# Patient Record
Sex: Male | Born: 1945 | Race: Black or African American | Hispanic: No | Marital: Single | Smoking: Current every day smoker
Health system: Southern US, Community
[De-identification: ages and names within clinical notes are randomized; demographics above are authoritative.]

## PROBLEM LIST (undated history)

## (undated) DIAGNOSIS — E785 Hyperlipidemia, unspecified: Secondary | ICD-10-CM

## (undated) DIAGNOSIS — N400 Enlarged prostate without lower urinary tract symptoms: Secondary | ICD-10-CM

## (undated) DIAGNOSIS — F17200 Nicotine dependence, unspecified, uncomplicated: Secondary | ICD-10-CM

## (undated) DIAGNOSIS — I5031 Acute diastolic (congestive) heart failure: Secondary | ICD-10-CM

## (undated) DIAGNOSIS — I10 Essential (primary) hypertension: Secondary | ICD-10-CM

## (undated) DIAGNOSIS — I452 Bifascicular block: Secondary | ICD-10-CM

## (undated) DIAGNOSIS — E669 Obesity, unspecified: Secondary | ICD-10-CM

## (undated) HISTORY — DX: Bifascicular block: I45.2

## (undated) HISTORY — DX: Acute diastolic (congestive) heart failure: I50.31

## (undated) HISTORY — DX: Essential (primary) hypertension: I10

## (undated) HISTORY — DX: Obesity, unspecified: E66.9

## (undated) HISTORY — DX: Hyperlipidemia, unspecified: E78.5

## (undated) HISTORY — DX: Benign prostatic hyperplasia without lower urinary tract symptoms: N40.0

## (undated) HISTORY — DX: Nicotine dependence, unspecified, uncomplicated: F17.200

---

## 1998-04-29 ENCOUNTER — Other Ambulatory Visit: Admission: RE | Admit: 1998-04-29 | Discharge: 1998-04-29 | Payer: Self-pay | Admitting: Family Medicine

## 1998-06-02 ENCOUNTER — Other Ambulatory Visit: Admission: RE | Admit: 1998-06-02 | Discharge: 1998-06-02 | Payer: Self-pay | Admitting: Family Medicine

## 1998-07-01 ENCOUNTER — Other Ambulatory Visit: Admission: RE | Admit: 1998-07-01 | Discharge: 1998-07-01 | Payer: Self-pay | Admitting: Family Medicine

## 2004-10-27 ENCOUNTER — Ambulatory Visit: Payer: Self-pay | Admitting: Family Medicine

## 2005-02-13 ENCOUNTER — Ambulatory Visit: Payer: Self-pay | Admitting: Family Medicine

## 2005-03-29 ENCOUNTER — Ambulatory Visit: Payer: Self-pay | Admitting: Family Medicine

## 2005-04-20 ENCOUNTER — Ambulatory Visit: Payer: Self-pay | Admitting: Family Medicine

## 2005-06-27 ENCOUNTER — Ambulatory Visit: Payer: Self-pay | Admitting: Family Medicine

## 2005-08-30 ENCOUNTER — Ambulatory Visit: Payer: Self-pay | Admitting: Family Medicine

## 2005-11-01 ENCOUNTER — Ambulatory Visit: Payer: Self-pay | Admitting: Family Medicine

## 2005-12-27 ENCOUNTER — Ambulatory Visit: Payer: Self-pay | Admitting: Family Medicine

## 2006-04-06 ENCOUNTER — Ambulatory Visit: Payer: Self-pay | Admitting: Family Medicine

## 2006-06-29 ENCOUNTER — Ambulatory Visit: Payer: Self-pay | Admitting: Family Medicine

## 2006-11-06 ENCOUNTER — Ambulatory Visit: Payer: Self-pay | Admitting: Family Medicine

## 2007-01-02 ENCOUNTER — Ambulatory Visit: Payer: Self-pay | Admitting: Family Medicine

## 2007-02-05 ENCOUNTER — Ambulatory Visit: Payer: Self-pay | Admitting: Family Medicine

## 2007-04-11 ENCOUNTER — Ambulatory Visit: Payer: Self-pay | Admitting: Family Medicine

## 2007-05-30 ENCOUNTER — Ambulatory Visit: Payer: Self-pay | Admitting: Internal Medicine

## 2007-06-18 ENCOUNTER — Ambulatory Visit: Payer: Self-pay | Admitting: Internal Medicine

## 2007-07-17 ENCOUNTER — Ambulatory Visit: Payer: Self-pay | Admitting: Family Medicine

## 2007-07-17 LAB — CONVERTED CEMR LAB
BUN: 11 mg/dL (ref 6–23)
Chloride: 101 meq/L (ref 96–112)
Creatinine, Ser: 0.79 mg/dL (ref 0.40–1.50)
Glucose, Bld: 82 mg/dL (ref 70–99)
Potassium: 4.6 meq/L (ref 3.5–5.3)

## 2007-09-09 ENCOUNTER — Ambulatory Visit: Payer: Self-pay | Admitting: Family Medicine

## 2007-11-12 ENCOUNTER — Ambulatory Visit: Payer: Self-pay | Admitting: Family Medicine

## 2007-11-12 LAB — CONVERTED CEMR LAB
ALT: 15 U/L
AST: 14 U/L
Albumin: 4.3 g/dL
Alkaline Phosphatase: 83 U/L
BUN: 19 mg/dL
Basophils Absolute: 0 10*3/uL
Basophils Relative: 0 %
CO2: 28 meq/L
Calcium: 9.6 mg/dL
Chloride: 99 meq/L
Cholesterol: 180 mg/dL
Creatinine, Ser: 0.89 mg/dL
Eosinophils Absolute: 0.1 10*3/uL — ABNORMAL LOW
Eosinophils Relative: 2 %
Glucose, Bld: 113 mg/dL — ABNORMAL HIGH
HCT: 45.5 %
HDL: 50 mg/dL
Hemoglobin: 14 g/dL
LDL Cholesterol: 115 mg/dL — ABNORMAL HIGH
Lymphocytes Relative: 33 %
Lymphs Abs: 2.2 10*3/uL
MCHC: 30.8 g/dL
MCV: 98.9 fL
Monocytes Absolute: 0.7 10*3/uL
Monocytes Relative: 10 %
Neutro Abs: 3.8 10*3/uL
Neutrophils Relative %: 55 %
Platelets: 275 10*3/uL
Potassium: 4.7 meq/L
RBC: 4.6 M/uL
RDW: 14.6 %
Sodium: 140 meq/L
Total Bilirubin: 0.5 mg/dL
Total CHOL/HDL Ratio: 3.6
Total Protein: 7.9 g/dL
Triglycerides: 77 mg/dL
VLDL: 15 mg/dL
WBC: 6.8 10*3/uL

## 2008-01-13 ENCOUNTER — Ambulatory Visit: Payer: Self-pay | Admitting: Family Medicine

## 2008-03-10 ENCOUNTER — Encounter: Payer: Self-pay | Admitting: Cardiology

## 2008-03-10 ENCOUNTER — Encounter (INDEPENDENT_AMBULATORY_CARE_PROVIDER_SITE_OTHER): Payer: Self-pay | Admitting: Family Medicine

## 2008-03-10 ENCOUNTER — Ambulatory Visit (HOSPITAL_COMMUNITY): Admission: RE | Admit: 2008-03-10 | Discharge: 2008-03-10 | Payer: Self-pay | Admitting: Family Medicine

## 2008-03-26 ENCOUNTER — Ambulatory Visit: Payer: Self-pay | Admitting: Family Medicine

## 2008-03-26 LAB — CONVERTED CEMR LAB: Microalb, Ur: 3.36 mg/dL — ABNORMAL HIGH (ref 0.00–1.89)

## 2008-05-14 ENCOUNTER — Ambulatory Visit: Payer: Self-pay | Admitting: Internal Medicine

## 2008-07-27 ENCOUNTER — Ambulatory Visit: Payer: Self-pay | Admitting: Family Medicine

## 2008-07-27 LAB — CONVERTED CEMR LAB
Chloride: 101 meq/L (ref 96–112)
Creatinine, Ser: 0.73 mg/dL (ref 0.40–1.50)
Sodium: 141 meq/L (ref 135–145)

## 2008-08-04 ENCOUNTER — Ambulatory Visit: Payer: Self-pay | Admitting: Internal Medicine

## 2008-10-15 ENCOUNTER — Ambulatory Visit: Payer: Self-pay | Admitting: Family Medicine

## 2009-02-01 ENCOUNTER — Ambulatory Visit: Payer: Self-pay | Admitting: Internal Medicine

## 2009-02-08 ENCOUNTER — Ambulatory Visit: Payer: Self-pay | Admitting: Family Medicine

## 2009-02-08 LAB — CONVERTED CEMR LAB
AST: 15 units/L (ref 0–37)
Albumin: 4.2 g/dL (ref 3.5–5.2)
Alkaline Phosphatase: 88 units/L (ref 39–117)
Band Neutrophils: 0 % (ref 0–10)
Basophils Absolute: 0 10*3/uL (ref 0.0–0.1)
HCT: 50.8 % (ref 39.0–52.0)
LDL Cholesterol: 112 mg/dL — ABNORMAL HIGH (ref 0–99)
Lymphocytes Relative: 40 % (ref 12–46)
Monocytes Absolute: 0.5 10*3/uL (ref 0.1–1.0)
Neutro Abs: 2.8 10*3/uL (ref 1.7–7.7)
Platelets: 254 10*3/uL (ref 150–400)
Potassium: 4.2 meq/L (ref 3.5–5.3)
Sodium: 142 meq/L (ref 135–145)
TSH: 1.443 microintl units/mL (ref 0.350–4.50)
Total Protein: 7.8 g/dL (ref 6.0–8.3)
Vit D, 25-Hydroxy: 7 ng/mL — ABNORMAL LOW (ref 30–89)
WBC: 5.8 10*3/uL (ref 4.0–10.5)

## 2009-03-16 ENCOUNTER — Ambulatory Visit: Payer: Self-pay | Admitting: Internal Medicine

## 2009-03-16 ENCOUNTER — Encounter (INDEPENDENT_AMBULATORY_CARE_PROVIDER_SITE_OTHER): Payer: Self-pay | Admitting: Family Medicine

## 2009-03-16 ENCOUNTER — Encounter: Payer: Self-pay | Admitting: Cardiology

## 2009-04-01 DIAGNOSIS — E1151 Type 2 diabetes mellitus with diabetic peripheral angiopathy without gangrene: Secondary | ICD-10-CM | POA: Insufficient documentation

## 2009-04-01 DIAGNOSIS — I10 Essential (primary) hypertension: Secondary | ICD-10-CM | POA: Insufficient documentation

## 2009-04-14 ENCOUNTER — Ambulatory Visit: Payer: Self-pay | Admitting: Cardiology

## 2009-04-14 DIAGNOSIS — R0602 Shortness of breath: Secondary | ICD-10-CM | POA: Insufficient documentation

## 2009-04-14 DIAGNOSIS — R9431 Abnormal electrocardiogram [ECG] [EKG]: Secondary | ICD-10-CM

## 2009-04-14 DIAGNOSIS — E785 Hyperlipidemia, unspecified: Secondary | ICD-10-CM

## 2009-04-14 DIAGNOSIS — F17200 Nicotine dependence, unspecified, uncomplicated: Secondary | ICD-10-CM

## 2009-04-14 DIAGNOSIS — I5032 Chronic diastolic (congestive) heart failure: Secondary | ICD-10-CM

## 2009-04-20 ENCOUNTER — Telehealth (INDEPENDENT_AMBULATORY_CARE_PROVIDER_SITE_OTHER): Payer: Self-pay | Admitting: *Deleted

## 2009-04-21 ENCOUNTER — Ambulatory Visit: Payer: Self-pay | Admitting: Cardiology

## 2009-04-21 ENCOUNTER — Ambulatory Visit: Payer: Self-pay

## 2009-04-22 LAB — CONVERTED CEMR LAB
BUN: 6 mg/dL (ref 6–23)
GFR calc non Af Amer: 175.81 mL/min (ref >60)
Potassium: 4.5 meq/L (ref 3.5–5.1)
Sodium: 142 meq/L (ref 135–145)

## 2009-08-10 ENCOUNTER — Ambulatory Visit: Payer: Self-pay | Admitting: Cardiology

## 2009-08-11 ENCOUNTER — Telehealth: Payer: Self-pay | Admitting: Cardiology

## 2009-10-26 ENCOUNTER — Encounter: Payer: Self-pay | Admitting: Cardiology

## 2009-10-26 ENCOUNTER — Ambulatory Visit: Payer: Self-pay | Admitting: Family Medicine

## 2009-10-26 LAB — CONVERTED CEMR LAB
AST: 13 units/L (ref 0–37)
Albumin: 4.2 g/dL (ref 3.5–5.2)
Alkaline Phosphatase: 75 units/L (ref 39–117)
HDL: 50 mg/dL (ref >39)
Indirect Bilirubin: 0.5 mg/dL (ref 0.0–0.9)
Total Bilirubin: 0.7 mg/dL (ref 0.3–1.2)
Triglycerides: 76 mg/dL (ref <150)
Vit D, 25-Hydroxy: 24 ng/mL — ABNORMAL LOW (ref 30–89)

## 2009-11-10 ENCOUNTER — Telehealth: Payer: Self-pay | Admitting: Cardiology

## 2010-01-25 ENCOUNTER — Ambulatory Visit: Payer: Self-pay | Admitting: Family Medicine

## 2010-02-08 ENCOUNTER — Ambulatory Visit: Payer: Self-pay | Admitting: Family Medicine

## 2010-02-08 LAB — CONVERTED CEMR LAB
BUN: 8 mg/dL (ref 6–23)
CO2: 28 meq/L (ref 19–32)
Calcium: 9.3 mg/dL (ref 8.4–10.5)
Creatinine, Ser: 0.62 mg/dL (ref 0.40–1.50)

## 2010-04-19 ENCOUNTER — Ambulatory Visit: Payer: Self-pay | Admitting: Family Medicine

## 2010-05-17 ENCOUNTER — Ambulatory Visit: Payer: Self-pay | Admitting: Family Medicine

## 2010-10-24 ENCOUNTER — Encounter (HOSPITAL_BASED_OUTPATIENT_CLINIC_OR_DEPARTMENT_OTHER)
Admission: RE | Admit: 2010-10-24 | Discharge: 2010-11-15 | Payer: Self-pay | Source: Home / Self Care | Admitting: Internal Medicine

## 2010-10-24 ENCOUNTER — Ambulatory Visit (HOSPITAL_COMMUNITY): Admission: RE | Admit: 2010-10-24 | Discharge: 2010-10-24 | Payer: Self-pay | Admitting: Internal Medicine

## 2010-10-27 ENCOUNTER — Ambulatory Visit: Payer: Self-pay | Admitting: Surgery

## 2011-02-07 ENCOUNTER — Inpatient Hospital Stay (HOSPITAL_COMMUNITY)
Admission: EM | Admit: 2011-02-07 | Discharge: 2011-02-10 | DRG: 603 | Disposition: A | Discharge status: Other Acute Inpatient Hospital | Payer: Medicare Other | Attending: Family Medicine | Admitting: Family Medicine

## 2011-02-07 ENCOUNTER — Emergency Department (HOSPITAL_COMMUNITY): Payer: Medicare Other

## 2011-02-07 DIAGNOSIS — L89899 Pressure ulcer of other site, unspecified stage: Secondary | ICD-10-CM | POA: Diagnosis present

## 2011-02-07 DIAGNOSIS — F17200 Nicotine dependence, unspecified, uncomplicated: Secondary | ICD-10-CM | POA: Diagnosis present

## 2011-02-07 DIAGNOSIS — Z993 Dependence on wheelchair: Secondary | ICD-10-CM

## 2011-02-07 DIAGNOSIS — E119 Type 2 diabetes mellitus without complications: Secondary | ICD-10-CM | POA: Diagnosis present

## 2011-02-07 DIAGNOSIS — L02419 Cutaneous abscess of limb, unspecified: Principal | ICD-10-CM | POA: Diagnosis present

## 2011-02-07 DIAGNOSIS — I872 Venous insufficiency (chronic) (peripheral): Secondary | ICD-10-CM | POA: Diagnosis present

## 2011-02-07 DIAGNOSIS — R269 Unspecified abnormalities of gait and mobility: Secondary | ICD-10-CM | POA: Diagnosis present

## 2011-02-07 DIAGNOSIS — E876 Hypokalemia: Secondary | ICD-10-CM | POA: Diagnosis present

## 2011-02-07 DIAGNOSIS — R5381 Other malaise: Secondary | ICD-10-CM | POA: Diagnosis present

## 2011-02-07 DIAGNOSIS — E785 Hyperlipidemia, unspecified: Secondary | ICD-10-CM | POA: Diagnosis present

## 2011-02-07 DIAGNOSIS — I10 Essential (primary) hypertension: Secondary | ICD-10-CM | POA: Diagnosis present

## 2011-02-07 DIAGNOSIS — I890 Lymphedema, not elsewhere classified: Secondary | ICD-10-CM | POA: Diagnosis present

## 2011-02-07 DIAGNOSIS — L8992 Pressure ulcer of unspecified site, stage 2: Secondary | ICD-10-CM | POA: Diagnosis present

## 2011-02-07 DIAGNOSIS — R29898 Other symptoms and signs involving the musculoskeletal system: Secondary | ICD-10-CM | POA: Diagnosis present

## 2011-02-07 LAB — HEPATIC FUNCTION PANEL
Alkaline Phosphatase: 97 U/L (ref 39–117)
Bilirubin, Direct: 0.2 mg/dL (ref 0.0–0.3)
Total Bilirubin: 1 mg/dL (ref 0.3–1.2)

## 2011-02-07 LAB — DIFFERENTIAL
Basophils Absolute: 0 10*3/uL (ref 0.0–0.1)
Eosinophils Absolute: 0.1 10*3/uL (ref 0.0–0.7)
Lymphocytes Relative: 16 % (ref 12–46)
Lymphs Abs: 1.3 10*3/uL (ref 0.7–4.0)
Monocytes Absolute: 0.8 10*3/uL (ref 0.1–1.0)
Neutro Abs: 6.2 10*3/uL (ref 1.7–7.7)

## 2011-02-07 LAB — BASIC METABOLIC PANEL
BUN: 3 mg/dL — ABNORMAL LOW (ref 6–23)
Calcium: 9.1 mg/dL (ref 8.4–10.5)
Creatinine, Ser: 0.72 mg/dL (ref 0.4–1.5)
GFR calc Af Amer: >60 mL/min (ref >60)
GFR calc non Af Amer: >60 mL/min (ref >60)

## 2011-02-07 LAB — URINALYSIS, ROUTINE W REFLEX MICROSCOPIC
Bilirubin Urine: NEGATIVE
Ketones, ur: NEGATIVE mg/dL
Nitrite: NEGATIVE
Protein, ur: NEGATIVE mg/dL
Urine Glucose, Fasting: NEGATIVE mg/dL

## 2011-02-07 LAB — GLUCOSE, CAPILLARY: Glucose-Capillary: 161 mg/dL — ABNORMAL HIGH (ref 70–99)

## 2011-02-07 LAB — CBC
MCH: 31 pg (ref 26.0–34.0)
MCHC: 31.6 g/dL (ref 30.0–36.0)
Platelets: 307 10*3/uL (ref 150–400)
RBC: 5.29 MIL/uL (ref 4.22–5.81)
RDW: 17.1 % — ABNORMAL HIGH (ref 11.5–15.5)

## 2011-02-07 LAB — PROTIME-INR
INR: 1.02 (ref 0.00–1.49)
Prothrombin Time: 13.6 seconds (ref 11.6–15.2)

## 2011-02-07 LAB — URINE MICROSCOPIC-ADD ON

## 2011-02-07 LAB — BRAIN NATRIURETIC PEPTIDE: Pro B Natriuretic peptide (BNP): 31.4 pg/mL (ref 0.0–100.0)

## 2011-02-08 LAB — CBC
MCV: 98.8 fL (ref 78.0–100.0)
Platelets: 288 10*3/uL (ref 150–400)
RBC: 4.8 MIL/uL (ref 4.22–5.81)
RDW: 16.2 % — ABNORMAL HIGH (ref 11.5–15.5)
WBC: 7 10*3/uL (ref 4.0–10.5)

## 2011-02-08 LAB — BASIC METABOLIC PANEL
Calcium: 8.4 mg/dL (ref 8.4–10.5)
Chloride: 98 mEq/L (ref 96–112)
Creatinine, Ser: 0.64 mg/dL (ref 0.4–1.5)
GFR calc Af Amer: >60 mL/min (ref >60)

## 2011-02-08 LAB — HEMOGLOBIN A1C: Mean Plasma Glucose: 146 mg/dL — ABNORMAL HIGH (ref <117)

## 2011-02-08 LAB — GLUCOSE, CAPILLARY: Glucose-Capillary: 135 mg/dL — ABNORMAL HIGH (ref 70–99)

## 2011-02-08 LAB — LIPID PANEL: Cholesterol: 117 mg/dL (ref 0–200)

## 2011-02-08 LAB — MAGNESIUM: Magnesium: 1.5 mg/dL (ref 1.5–2.5)

## 2011-02-08 LAB — URINE CULTURE
Colony Count: NO GROWTH
Culture: NO GROWTH

## 2011-02-08 LAB — T4, FREE: Free T4: 0.97 ng/dL (ref 0.80–1.80)
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
## 2011-02-09 LAB — GLUCOSE, CAPILLARY
Glucose-Capillary: 126 mg/dL — ABNORMAL HIGH (ref 70–99)
Glucose-Capillary: 110 mg/dL — ABNORMAL HIGH (ref 70–99)

## 2011-02-09 LAB — BASIC METABOLIC PANEL
BUN: 2 mg/dL — ABNORMAL LOW (ref 6–23)
Calcium: 8.4 mg/dL (ref 8.4–10.5)
GFR calc non Af Amer: >60 mL/min (ref >60)
Glucose, Bld: 110 mg/dL — ABNORMAL HIGH (ref 70–99)

## 2011-02-10 ENCOUNTER — Inpatient Hospital Stay
Admission: AD | Admit: 2011-02-10 | Discharge: 2011-03-01 | Disposition: A | Discharge status: Home / Self Care | Payer: Self-pay | Source: Ambulatory Visit | Attending: Internal Medicine | Admitting: Internal Medicine

## 2011-02-10 LAB — BASIC METABOLIC PANEL
BUN: 4 mg/dL — ABNORMAL LOW (ref 6–23)
CO2: 33 mEq/L — ABNORMAL HIGH (ref 19–32)
Calcium: 8.5 mg/dL (ref 8.4–10.5)
Creatinine, Ser: 0.78 mg/dL (ref 0.4–1.5)
GFR calc Af Amer: >60 mL/min (ref >60)

## 2011-02-10 LAB — GLUCOSE, CAPILLARY: Glucose-Capillary: 111 mg/dL — ABNORMAL HIGH (ref 70–99)

## 2011-02-12 LAB — URINALYSIS, ROUTINE W REFLEX MICROSCOPIC
Glucose, UA: NEGATIVE mg/dL
Specific Gravity, Urine: 1.027 (ref 1.005–1.030)
Urobilinogen, UA: 0.2 mg/dL (ref 0.0–1.0)
pH: 5 (ref 5.0–8.0)

## 2011-02-12 LAB — URINE MICROSCOPIC-ADD ON

## 2011-02-12 LAB — CBC
HCT: 45.7 % (ref 39.0–52.0)
Hemoglobin: 14.4 g/dL (ref 13.0–17.0)
MCV: 95.8 fL (ref 78.0–100.0)
RBC: 4.77 MIL/uL (ref 4.22–5.81)
RDW: 15.7 % — ABNORMAL HIGH (ref 11.5–15.5)
WBC: 8.7 10*3/uL (ref 4.0–10.5)

## 2011-02-12 LAB — BASIC METABOLIC PANEL
BUN: 16 mg/dL (ref 6–23)
CO2: 35 mEq/L — ABNORMAL HIGH (ref 19–32)
Chloride: 93 mEq/L — ABNORMAL LOW (ref 96–112)
Glucose, Bld: 107 mg/dL — ABNORMAL HIGH (ref 70–99)
Potassium: 3.5 mEq/L (ref 3.5–5.1)

## 2011-02-12 LAB — OSMOLALITY, URINE: Osmolality, Ur: 395 mOsm/kg (ref 390–1090)

## 2011-02-13 LAB — BASIC METABOLIC PANEL
CO2: 35 mEq/L — ABNORMAL HIGH (ref 19–32)
Calcium: 8.3 mg/dL — ABNORMAL LOW (ref 8.4–10.5)
Creatinine, Ser: 1.77 mg/dL — ABNORMAL HIGH (ref 0.4–1.5)
GFR calc Af Amer: 47 mL/min — ABNORMAL LOW (ref >60)
GFR calc non Af Amer: 39 mL/min — ABNORMAL LOW (ref >60)
Sodium: 140 mEq/L (ref 135–145)

## 2011-02-13 LAB — CBC
Hemoglobin: 12.9 g/dL — ABNORMAL LOW (ref 13.0–17.0)
MCH: 29.2 pg (ref 26.0–34.0)
MCHC: 30.5 g/dL (ref 30.0–36.0)
RDW: 15.8 % — ABNORMAL HIGH (ref 11.5–15.5)

## 2011-02-14 ENCOUNTER — Other Ambulatory Visit (HOSPITAL_COMMUNITY): Payer: Medicare Other | Attending: Internal Medicine

## 2011-02-14 LAB — URINE MICROSCOPIC-ADD ON

## 2011-02-14 LAB — URINALYSIS, ROUTINE W REFLEX MICROSCOPIC
Nitrite: NEGATIVE
Specific Gravity, Urine: 1.02 (ref 1.005–1.030)
Urobilinogen, UA: 0.2 mg/dL (ref 0.0–1.0)
pH: 6 (ref 5.0–8.0)

## 2011-02-14 LAB — URINE CULTURE: Culture: NO GROWTH

## 2011-02-14 LAB — BASIC METABOLIC PANEL
BUN: 15 mg/dL (ref 6–23)
CO2: 33 mEq/L — ABNORMAL HIGH (ref 19–32)
Calcium: 8.4 mg/dL (ref 8.4–10.5)
Chloride: 100 mEq/L (ref 96–112)
Creatinine, Ser: 1.08 mg/dL (ref 0.4–1.5)
Glucose, Bld: 128 mg/dL — ABNORMAL HIGH (ref 70–99)

## 2011-02-15 LAB — COMPREHENSIVE METABOLIC PANEL
AST: 17 U/L (ref 0–37)
BUN: 11 mg/dL (ref 6–23)
CO2: 34 mEq/L — ABNORMAL HIGH (ref 19–32)
Chloride: 102 mEq/L (ref 96–112)
Creatinine, Ser: 0.83 mg/dL (ref 0.4–1.5)
GFR calc non Af Amer: >60 mL/min (ref >60)
Glucose, Bld: 114 mg/dL — ABNORMAL HIGH (ref 70–99)
Total Bilirubin: 0.4 mg/dL (ref 0.3–1.2)

## 2011-02-15 LAB — FOLATE RBC: RBC Folate: 320 ng/mL (ref 180–600)

## 2011-02-17 LAB — CBC
MCH: 30 pg (ref 26.0–34.0)
Platelets: 320 10*3/uL (ref 150–400)
RBC: 4.53 MIL/uL (ref 4.22–5.81)
WBC: 5.7 10*3/uL (ref 4.0–10.5)

## 2011-02-17 LAB — BASIC METABOLIC PANEL
Chloride: 100 mEq/L (ref 96–112)
Creatinine, Ser: 0.75 mg/dL (ref 0.4–1.5)
GFR calc Af Amer: >60 mL/min (ref >60)
Sodium: 141 mEq/L (ref 135–145)
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
## 2011-02-21 LAB — CBC
MCV: 96.6 fL (ref 78.0–100.0)
Platelets: 333 10*3/uL (ref 150–400)
RBC: 4.65 MIL/uL (ref 4.22–5.81)
WBC: 8.8 10*3/uL (ref 4.0–10.5)

## 2011-02-21 LAB — BASIC METABOLIC PANEL
BUN: 6 mg/dL (ref 6–23)
Chloride: 97 mEq/L (ref 96–112)
GFR calc Af Amer: >60 mL/min (ref >60)
Potassium: 3.7 mEq/L (ref 3.5–5.1)

## 2011-02-21 LAB — GLUCOSE, CAPILLARY: Glucose-Capillary: 125 mg/dL — ABNORMAL HIGH (ref 70–99)

## 2011-02-21 LAB — URINALYSIS, ROUTINE W REFLEX MICROSCOPIC
Nitrite: NEGATIVE
Specific Gravity, Urine: 1.02 (ref 1.005–1.030)
pH: 5.5 (ref 5.0–8.0)

## 2011-02-21 LAB — URINE MICROSCOPIC-ADD ON

## 2011-02-23 LAB — URINE CULTURE

## 2011-02-24 LAB — BASIC METABOLIC PANEL
CO2: 39 mEq/L — ABNORMAL HIGH (ref 19–32)
GFR calc non Af Amer: >60 mL/min (ref >60)
Glucose, Bld: 132 mg/dL — ABNORMAL HIGH (ref 70–99)
Potassium: 3.6 mEq/L (ref 3.5–5.1)
Sodium: 144 mEq/L (ref 135–145)

## 2011-02-24 LAB — CBC
HCT: 45.8 % (ref 39.0–52.0)
Hemoglobin: 14.5 g/dL (ref 13.0–17.0)
RDW: 15.4 % (ref 11.5–15.5)
WBC: 6.2 10*3/uL (ref 4.0–10.5)

## 2011-02-27 LAB — POTASSIUM: Potassium: 4 mEq/L (ref 3.5–5.1)

## 2011-02-28 LAB — BASIC METABOLIC PANEL
BUN: 9 mg/dL (ref 6–23)
Creatinine, Ser: 0.79 mg/dL (ref 0.4–1.5)
GFR calc Af Amer: >60 mL/min (ref >60)
GFR calc non Af Amer: >60 mL/min (ref >60)

## 2011-02-28 LAB — POTASSIUM: Potassium: 4.7 mEq/L (ref 3.5–5.1)

## 2011-03-01 LAB — BASIC METABOLIC PANEL
BUN: 9 mg/dL (ref 6–23)
Calcium: 9.1 mg/dL (ref 8.4–10.5)
Creatinine, Ser: 0.73 mg/dL (ref 0.4–1.5)
GFR calc Af Amer: >60 mL/min (ref >60)
GFR calc non Af Amer: >60 mL/min (ref >60)

## 2011-04-25 NOTE — Procedures (Signed)
DUPLEX DEEP VENOUS EXAM - LOWER EXTREMITY   INDICATION:  Venous insufficiency.   HISTORY:  Edema:  Yes.  Trauma/Surgery:  No.  Pain:  No.  PE:  No.  Previous DVT:  No.  Anticoagulants:  Other:  Right lateral mid lower leg and left anterior distal lower leg  wounds for about 6 months.   DUPLEX EXAM:                CFV   SFV   PopV  PTV    GSV                R  L  R  L  R  L  R   L  R  L  Thrombosis    o  o  o  o  o  o         o  o  Spontaneous   +  +  +  +  +  +         +  +  Phasic        +  +  +  +  +  +         +  +  Augmentation  +  +  +  +  +  +         +  +  Compressible  +  +  +  +  +  +         +  +  Competent     +  +  +  +  +  +         +  +   Legend:  + - yes  o - no  p - partial  D - decreased   IMPRESSION:  1. No evidence of deep or superficial vein thrombosis noted in the      bilateral femoral-popliteal venous systems or saphenous veins.  2. No evidence of clinically significant reflux noted in the bilateral      lower extremity veins.  3. Unable to adequately visualize the bilateral calf veins due to      edema, patient body habitus, and wound locations.    _____________________________  V. Charlena Cross, MD   CH/MEDQ  D:  10/27/2010  T:  10/27/2010  Job:  [id number]

## 2011-06-13 ENCOUNTER — Encounter: Payer: Self-pay | Admitting: Cardiology

## 2012-03-22 ENCOUNTER — Encounter (HOSPITAL_BASED_OUTPATIENT_CLINIC_OR_DEPARTMENT_OTHER): Payer: Medicare Other | Attending: General Surgery

## 2012-03-22 DIAGNOSIS — E119 Type 2 diabetes mellitus without complications: Secondary | ICD-10-CM | POA: Insufficient documentation

## 2012-03-22 DIAGNOSIS — Z79899 Other long term (current) drug therapy: Secondary | ICD-10-CM | POA: Insufficient documentation

## 2012-03-22 DIAGNOSIS — I10 Essential (primary) hypertension: Secondary | ICD-10-CM | POA: Insufficient documentation

## 2012-03-22 DIAGNOSIS — I872 Venous insufficiency (chronic) (peripheral): Secondary | ICD-10-CM | POA: Insufficient documentation

## 2012-03-22 DIAGNOSIS — L97509 Non-pressure chronic ulcer of other part of unspecified foot with unspecified severity: Secondary | ICD-10-CM | POA: Insufficient documentation

## 2012-03-22 LAB — GLUCOSE, CAPILLARY: Glucose-Capillary: 83 mg/dL (ref 70–99)

## 2012-04-12 ENCOUNTER — Encounter (HOSPITAL_BASED_OUTPATIENT_CLINIC_OR_DEPARTMENT_OTHER): Payer: Medicare Other | Attending: General Surgery

## 2012-04-12 DIAGNOSIS — I10 Essential (primary) hypertension: Secondary | ICD-10-CM | POA: Insufficient documentation

## 2012-04-12 DIAGNOSIS — Z7982 Long term (current) use of aspirin: Secondary | ICD-10-CM | POA: Insufficient documentation

## 2012-04-12 DIAGNOSIS — Z79899 Other long term (current) drug therapy: Secondary | ICD-10-CM | POA: Insufficient documentation

## 2012-04-12 DIAGNOSIS — I872 Venous insufficiency (chronic) (peripheral): Secondary | ICD-10-CM | POA: Insufficient documentation

## 2012-04-12 DIAGNOSIS — L97809 Non-pressure chronic ulcer of other part of unspecified lower leg with unspecified severity: Secondary | ICD-10-CM | POA: Insufficient documentation

## 2012-04-12 DIAGNOSIS — E119 Type 2 diabetes mellitus without complications: Secondary | ICD-10-CM | POA: Insufficient documentation

## 2012-04-19 ENCOUNTER — Encounter (HOSPITAL_BASED_OUTPATIENT_CLINIC_OR_DEPARTMENT_OTHER): Payer: Medicare Other

## 2012-05-24 ENCOUNTER — Encounter (HOSPITAL_BASED_OUTPATIENT_CLINIC_OR_DEPARTMENT_OTHER): Payer: Medicare Other | Attending: General Surgery

## 2012-05-24 DIAGNOSIS — L98499 Non-pressure chronic ulcer of skin of other sites with unspecified severity: Secondary | ICD-10-CM | POA: Insufficient documentation

## 2012-05-24 DIAGNOSIS — I872 Venous insufficiency (chronic) (peripheral): Secondary | ICD-10-CM | POA: Insufficient documentation

## 2012-05-24 NOTE — Progress Notes (Signed)
[facility]  NAME:  Bryce [name], Bryce [name]                   ACCOUNT NO.:  MEDICAL RECORD NO.:  [id number]      DATE OF BIRTH:  14-Jul-1946  PHYSICIAN:  Joanne Gavel, M.D.              VISIT DATE:                                  OFFICE VISIT   ADMISSION DIAGNOSIS:  Chronic venous insufficiency with stasis ulcer.  DISCHARGE DIAGNOSIS:  Chronic venous insufficiency with stasis ulcer.  CLINIC COURSE:  The patient was treated with Kerlix, Coban, and elevation.  Bag Balm was applied to the very dry skin.  At time of discharge, the wound is completely healed.  RECOMMENDATIONS:  Elevation and support hose.  See [place] p.r.n.     Joanne Gavel, M.D.     RA/MEDQ  D:  05/24/2012  T:  05/24/2012  Job:  [id number]

## 2012-06-21 ENCOUNTER — Encounter (HOSPITAL_BASED_OUTPATIENT_CLINIC_OR_DEPARTMENT_OTHER): Payer: Medicare Other | Attending: General Surgery

## 2012-06-21 DIAGNOSIS — L98499 Non-pressure chronic ulcer of skin of other sites with unspecified severity: Secondary | ICD-10-CM | POA: Insufficient documentation

## 2012-06-21 DIAGNOSIS — I872 Venous insufficiency (chronic) (peripheral): Secondary | ICD-10-CM | POA: Insufficient documentation

## 2012-08-22 ENCOUNTER — Encounter (HOSPITAL_BASED_OUTPATIENT_CLINIC_OR_DEPARTMENT_OTHER): Payer: Medicare Other | Attending: Internal Medicine

## 2012-08-22 DIAGNOSIS — I890 Lymphedema, not elsewhere classified: Secondary | ICD-10-CM | POA: Insufficient documentation

## 2012-08-22 DIAGNOSIS — E785 Hyperlipidemia, unspecified: Secondary | ICD-10-CM | POA: Insufficient documentation

## 2012-08-22 DIAGNOSIS — L97809 Non-pressure chronic ulcer of other part of unspecified lower leg with unspecified severity: Secondary | ICD-10-CM | POA: Insufficient documentation

## 2012-08-22 DIAGNOSIS — E119 Type 2 diabetes mellitus without complications: Secondary | ICD-10-CM | POA: Insufficient documentation

## 2012-08-22 DIAGNOSIS — X58XXXA Exposure to other specified factors, initial encounter: Secondary | ICD-10-CM | POA: Insufficient documentation

## 2012-08-22 DIAGNOSIS — IMO0002 Reserved for concepts with insufficient information to code with codable children: Secondary | ICD-10-CM | POA: Insufficient documentation

## 2012-08-22 DIAGNOSIS — Z79899 Other long term (current) drug therapy: Secondary | ICD-10-CM | POA: Insufficient documentation

## 2012-08-22 DIAGNOSIS — I872 Venous insufficiency (chronic) (peripheral): Secondary | ICD-10-CM | POA: Insufficient documentation
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
## 2012-09-12 ENCOUNTER — Encounter (HOSPITAL_BASED_OUTPATIENT_CLINIC_OR_DEPARTMENT_OTHER): Payer: Medicare Other | Attending: Internal Medicine

## 2012-09-12 DIAGNOSIS — I890 Lymphedema, not elsewhere classified: Secondary | ICD-10-CM | POA: Insufficient documentation

## 2012-09-12 DIAGNOSIS — L97809 Non-pressure chronic ulcer of other part of unspecified lower leg with unspecified severity: Secondary | ICD-10-CM | POA: Insufficient documentation

## 2012-09-12 DIAGNOSIS — I87309 Chronic venous hypertension (idiopathic) without complications of unspecified lower extremity: Secondary | ICD-10-CM | POA: Insufficient documentation

## 2012-09-12 DIAGNOSIS — I872 Venous insufficiency (chronic) (peripheral): Secondary | ICD-10-CM | POA: Insufficient documentation

## 2012-09-19 ENCOUNTER — Encounter (HOSPITAL_BASED_OUTPATIENT_CLINIC_OR_DEPARTMENT_OTHER): Payer: Medicare Other

## 2013-03-14 ENCOUNTER — Encounter (HOSPITAL_BASED_OUTPATIENT_CLINIC_OR_DEPARTMENT_OTHER): Payer: Medicare Other | Attending: General Surgery

## 2013-03-14 DIAGNOSIS — L97909 Non-pressure chronic ulcer of unspecified part of unspecified lower leg with unspecified severity: Secondary | ICD-10-CM | POA: Insufficient documentation

## 2013-03-14 DIAGNOSIS — N289 Disorder of kidney and ureter, unspecified: Secondary | ICD-10-CM | POA: Insufficient documentation

## 2013-03-14 DIAGNOSIS — I10 Essential (primary) hypertension: Secondary | ICD-10-CM | POA: Insufficient documentation

## 2013-03-14 DIAGNOSIS — Z79899 Other long term (current) drug therapy: Secondary | ICD-10-CM | POA: Insufficient documentation

## 2013-03-14 DIAGNOSIS — I890 Lymphedema, not elsewhere classified: Secondary | ICD-10-CM | POA: Insufficient documentation

## 2013-03-14 DIAGNOSIS — I87319 Chronic venous hypertension (idiopathic) with ulcer of unspecified lower extremity: Secondary | ICD-10-CM | POA: Insufficient documentation

## 2013-03-15 NOTE — Progress Notes (Signed)
[facility]  NAME:  Bryce [name], Bryce [name] NO.:  [id number]  MEDICAL RECORD NO.:  [id number]      DATE OF BIRTH:  [birth year]  PHYSICIAN:  Ardath Sax, M.D.           VISIT DATE:                                  OFFICE VISIT   This is a 67 year old morbidly obese gentleman who comes here because of lymphedema, chronic venous congestion, and hypertension of both of his legs.  He also has a history of type 2 diabetes, hypertension, coronary artery disease, and he also says he has renal insufficiency.  His medications are metformin, ferrous sulfate, metoprolol, vitamins, amlodipine, folic acid, spironolactone, Lasix, aspirin, and tramadol. When he came to [place] today, he was found to have a blood pressure of 158/91, respirations 18, pulse 98, temperature 98.  He weighs 260 pounds.  He is 5 feet 7 inches tall.  On examination, he has venous hypertension and chronic venous ulcers and lymphedema of both of his legs.  We are treating him with Silvercel and Unna boots.  We have told him to use his lymphedema pumps at home 2 hours a day, which he says he has not been doing.  So, his diagnosis is venous hypertension with bilateral venous ulcers, hypertension, morbid obesity, and renal insufficiency.  We will have him come back next week.     Ardath Sax, M.D.     PP/MEDQ  D:  03/14/2013  T:  03/15/2013  Job:  [id number]

## 2013-04-11 ENCOUNTER — Encounter (HOSPITAL_BASED_OUTPATIENT_CLINIC_OR_DEPARTMENT_OTHER): Payer: Medicare Other | Attending: General Surgery

## 2013-04-11 DIAGNOSIS — L97909 Non-pressure chronic ulcer of unspecified part of unspecified lower leg with unspecified severity: Secondary | ICD-10-CM | POA: Insufficient documentation

## 2013-05-23 ENCOUNTER — Encounter (HOSPITAL_BASED_OUTPATIENT_CLINIC_OR_DEPARTMENT_OTHER): Payer: Medicare Other | Attending: General Surgery

## 2013-05-23 DIAGNOSIS — L97809 Non-pressure chronic ulcer of other part of unspecified lower leg with unspecified severity: Secondary | ICD-10-CM | POA: Insufficient documentation

## 2013-05-23 DIAGNOSIS — I872 Venous insufficiency (chronic) (peripheral): Secondary | ICD-10-CM | POA: Insufficient documentation

## 2013-05-23 DIAGNOSIS — I890 Lymphedema, not elsewhere classified: Secondary | ICD-10-CM | POA: Insufficient documentation

## 2013-05-23 DIAGNOSIS — I87309 Chronic venous hypertension (idiopathic) without complications of unspecified lower extremity: Secondary | ICD-10-CM | POA: Insufficient documentation

## 2013-07-04 ENCOUNTER — Encounter (HOSPITAL_BASED_OUTPATIENT_CLINIC_OR_DEPARTMENT_OTHER): Payer: Medicare Other | Attending: General Surgery

## 2013-07-04 DIAGNOSIS — I890 Lymphedema, not elsewhere classified: Secondary | ICD-10-CM | POA: Insufficient documentation

## 2013-07-04 DIAGNOSIS — I87309 Chronic venous hypertension (idiopathic) without complications of unspecified lower extremity: Secondary | ICD-10-CM | POA: Insufficient documentation

## 2013-07-13 ENCOUNTER — Inpatient Hospital Stay (HOSPITAL_COMMUNITY)
Admission: EM | Admit: 2013-07-13 | Discharge: 2013-07-23 | DRG: 208 | Disposition: A | Discharge status: Home Health | Payer: Medicare Other | Attending: Emergency Medicine | Admitting: Emergency Medicine

## 2013-07-13 ENCOUNTER — Emergency Department (HOSPITAL_COMMUNITY): Payer: Medicare Other

## 2013-07-13 DIAGNOSIS — E1151 Type 2 diabetes mellitus with diabetic peripheral angiopathy without gangrene: Secondary | ICD-10-CM | POA: Diagnosis present

## 2013-07-13 DIAGNOSIS — Z880 Allergy status to penicillin: Secondary | ICD-10-CM

## 2013-07-13 DIAGNOSIS — N400 Enlarged prostate without lower urinary tract symptoms: Secondary | ICD-10-CM | POA: Diagnosis present

## 2013-07-13 DIAGNOSIS — E872 Acidosis: Secondary | ICD-10-CM

## 2013-07-13 DIAGNOSIS — F411 Generalized anxiety disorder: Secondary | ICD-10-CM | POA: Diagnosis present

## 2013-07-13 DIAGNOSIS — R0603 Acute respiratory distress: Secondary | ICD-10-CM

## 2013-07-13 DIAGNOSIS — E876 Hypokalemia: Secondary | ICD-10-CM | POA: Diagnosis not present

## 2013-07-13 DIAGNOSIS — I509 Heart failure, unspecified: Secondary | ICD-10-CM | POA: Diagnosis present

## 2013-07-13 DIAGNOSIS — J9620 Acute and chronic respiratory failure, unspecified whether with hypoxia or hypercapnia: Principal | ICD-10-CM | POA: Diagnosis present

## 2013-07-13 DIAGNOSIS — J449 Chronic obstructive pulmonary disease, unspecified: Secondary | ICD-10-CM | POA: Diagnosis present

## 2013-07-13 DIAGNOSIS — J4489 Other specified chronic obstructive pulmonary disease: Secondary | ICD-10-CM | POA: Diagnosis present

## 2013-07-13 DIAGNOSIS — R0689 Other abnormalities of breathing: Secondary | ICD-10-CM

## 2013-07-13 DIAGNOSIS — J9600 Acute respiratory failure, unspecified whether with hypoxia or hypercapnia: Secondary | ICD-10-CM

## 2013-07-13 DIAGNOSIS — Z781 Physical restraint status: Secondary | ICD-10-CM | POA: Diagnosis not present

## 2013-07-13 DIAGNOSIS — I959 Hypotension, unspecified: Secondary | ICD-10-CM | POA: Diagnosis not present

## 2013-07-13 DIAGNOSIS — E785 Hyperlipidemia, unspecified: Secondary | ICD-10-CM | POA: Diagnosis present

## 2013-07-13 DIAGNOSIS — R0602 Shortness of breath: Secondary | ICD-10-CM

## 2013-07-13 DIAGNOSIS — J189 Pneumonia, unspecified organism: Secondary | ICD-10-CM | POA: Diagnosis present

## 2013-07-13 DIAGNOSIS — R1310 Dysphagia, unspecified: Secondary | ICD-10-CM | POA: Diagnosis present

## 2013-07-13 DIAGNOSIS — I10 Essential (primary) hypertension: Secondary | ICD-10-CM | POA: Diagnosis present

## 2013-07-13 DIAGNOSIS — IMO0002 Reserved for concepts with insufficient information to code with codable children: Secondary | ICD-10-CM | POA: Diagnosis not present

## 2013-07-13 DIAGNOSIS — R404 Transient alteration of awareness: Secondary | ICD-10-CM | POA: Diagnosis not present

## 2013-07-13 DIAGNOSIS — G4733 Obstructive sleep apnea (adult) (pediatric): Secondary | ICD-10-CM | POA: Diagnosis present

## 2013-07-13 DIAGNOSIS — I5032 Chronic diastolic (congestive) heart failure: Secondary | ICD-10-CM

## 2013-07-13 DIAGNOSIS — E119 Type 2 diabetes mellitus without complications: Secondary | ICD-10-CM | POA: Diagnosis present

## 2013-07-13 DIAGNOSIS — F17200 Nicotine dependence, unspecified, uncomplicated: Secondary | ICD-10-CM | POA: Diagnosis present

## 2013-07-13 LAB — URINALYSIS, ROUTINE W REFLEX MICROSCOPIC
Glucose, UA: NEGATIVE mg/dL
Specific Gravity, Urine: 1.009 (ref 1.005–1.030)

## 2013-07-13 LAB — POCT I-STAT 3, ART BLOOD GAS (G3+)
Acid-Base Excess: 9 mmol/L — ABNORMAL HIGH (ref 0.0–2.0)
Bicarbonate: 43.9 mEq/L — ABNORMAL HIGH (ref 20.0–24.0)
Bicarbonate: 43.4 mEq/L — ABNORMAL HIGH (ref 20.0–24.0)
Patient temperature: 98.6
TCO2: 47 mmol/L (ref 0–100)
TCO2: 46 mmol/L (ref 0–100)
pCO2 arterial: 100.6 mmHg (ref 35.0–45.0)
pH, Arterial: 7.243 — ABNORMAL LOW (ref 7.350–7.450)

## 2013-07-13 LAB — CBC WITH DIFFERENTIAL/PLATELET
Basophils Absolute: 0 10*3/uL (ref 0.0–0.1)
Basophils Relative: 0 % (ref 0–1)
Eosinophils Absolute: 0 10*3/uL (ref 0.0–0.7)
Eosinophils Relative: 0 % (ref 0–5)
Lymphs Abs: 1.1 10*3/uL (ref 0.7–4.0)
MCH: 30.3 pg (ref 26.0–34.0)
MCHC: 29.8 g/dL — ABNORMAL LOW (ref 30.0–36.0)
MCV: 101.7 fL — ABNORMAL HIGH (ref 78.0–100.0)
Platelets: 237 10*3/uL (ref 150–400)
RDW: 16.9 % — ABNORMAL HIGH (ref 11.5–15.5)

## 2013-07-13 LAB — URINE MICROSCOPIC-ADD ON

## 2013-07-13 LAB — CG4 I-STAT (LACTIC ACID): Lactic Acid, Venous: 1.77 mmol/L (ref 0.5–2.2)

## 2013-07-13 LAB — COMPREHENSIVE METABOLIC PANEL
ALT: 29 U/L (ref 0–53)
Albumin: 3 g/dL — ABNORMAL LOW (ref 3.5–5.2)
Calcium: 9.3 mg/dL (ref 8.4–10.5)
GFR calc Af Amer: >90 mL/min (ref >90)
Glucose, Bld: 133 mg/dL — ABNORMAL HIGH (ref 70–99)
Sodium: 139 mEq/L (ref 135–145)
Total Protein: 8.5 g/dL — ABNORMAL HIGH (ref 6.0–8.3)

## 2013-07-13 LAB — PRO B NATRIURETIC PEPTIDE: Pro B Natriuretic peptide (BNP): 1489 pg/mL — ABNORMAL HIGH (ref 0–125)

## 2013-07-13 MED ORDER — MIDAZOLAM HCL 2 MG/2ML IJ SOLN
2.0000 mg | Freq: Once | INTRAMUSCULAR | Status: AC
  Administered 2013-07-13: 2 mg via INTRAVENOUS
  Filled 2013-07-13: qty 2

## 2013-07-13 MED ORDER — ASPIRIN 81 MG PO CHEW
324.0000 mg | CHEWABLE_TABLET | ORAL | Status: AC
Start: 2013-07-13 — End: 2013-07-13

## 2013-07-13 MED ORDER — ETOMIDATE 2 MG/ML IV SOLN: 0.3000 mg/kg | Freq: Once | INTRAVENOUS | Status: DC

## 2013-07-13 MED ORDER — FENTANYL CITRATE 0.05 MG/ML IJ SOLN
100.0000 ug | Freq: Once | INTRAMUSCULAR | Status: AC
  Administered 2013-07-13: 100 ug via INTRAVENOUS
  Filled 2013-07-13: qty 2

## 2013-07-13 MED ORDER — ALBUTEROL SULFATE (5 MG/ML) 0.5% IN NEBU
5.0000 mg | INHALATION_SOLUTION | Freq: Once | RESPIRATORY_TRACT | Status: AC
  Administered 2013-07-13: 5 mg via RESPIRATORY_TRACT
  Filled 2013-07-13: qty 1

## 2013-07-13 MED ORDER — IPRATROPIUM BROMIDE HFA 17 MCG/ACT IN AERS
4.0000 | INHALATION_SPRAY | RESPIRATORY_TRACT | Status: DC
  Administered 2013-07-13 – 2013-07-17 (×22): 4 via RESPIRATORY_TRACT
  Filled 2013-07-13: qty 12.9

## 2013-07-13 MED ORDER — ROCURONIUM BROMIDE 50 MG/5ML IV SOLN
INTRAVENOUS | Status: AC
  Filled 2013-07-13: qty 2

## 2013-07-13 MED ORDER — SUCCINYLCHOLINE CHLORIDE 20 MG/ML IJ SOLN
120.0000 mg | Freq: Once | INTRAMUSCULAR | Status: AC
  Administered 2013-07-13: 120 mg via INTRAVENOUS

## 2013-07-13 MED ORDER — ALBUTEROL SULFATE HFA 108 (90 BASE) MCG/ACT IN AERS
4.0000 | INHALATION_SPRAY | RESPIRATORY_TRACT | Status: DC
  Administered 2013-07-14 – 2013-07-17 (×22): 4 via RESPIRATORY_TRACT
  Filled 2013-07-13 (×2): qty 6.7

## 2013-07-13 MED ORDER — PANTOPRAZOLE SODIUM 40 MG IV SOLR
40.0000 mg | Freq: Every day | INTRAVENOUS | Status: DC
  Administered 2013-07-13 – 2013-07-16 (×4): 40 mg via INTRAVENOUS
  Filled 2013-07-13 (×6): qty 40

## 2013-07-13 MED ORDER — ALBUTEROL SULFATE HFA 108 (90 BASE) MCG/ACT IN AERS: 4.0000 | INHALATION_SPRAY | RESPIRATORY_TRACT | Status: DC | PRN

## 2013-07-13 MED ORDER — IPRATROPIUM BROMIDE 0.02 % IN SOLN
0.5000 mg | Freq: Once | RESPIRATORY_TRACT | Status: AC
  Administered 2013-07-13: 0.5 mg via RESPIRATORY_TRACT
  Filled 2013-07-13: qty 2.5

## 2013-07-13 MED ORDER — LIDOCAINE HCL (CARDIAC) 20 MG/ML IV SOLN
INTRAVENOUS | Status: AC
  Filled 2013-07-13: qty 5

## 2013-07-13 MED ORDER — HEPARIN SODIUM (PORCINE) 5000 UNIT/ML IJ SOLN
5000.0000 [IU] | Freq: Three times a day (TID) | INTRAMUSCULAR | Status: DC
  Administered 2013-07-13 – 2013-07-23 (×29): 5000 [IU] via SUBCUTANEOUS
  Filled 2013-07-13 (×33): qty 1

## 2013-07-13 MED ORDER — SODIUM CHLORIDE 0.9 % IV SOLN
500.0000 mg | Freq: Four times a day (QID) | INTRAVENOUS | Status: DC
  Administered 2013-07-13 – 2013-07-15 (×6): 500 mg via INTRAVENOUS
  Filled 2013-07-13 (×9): qty 500

## 2013-07-13 MED ORDER — ASPIRIN 300 MG RE SUPP
300.0000 mg | RECTAL | Status: AC
Start: 2013-07-13 — End: 2013-07-13
  Administered 2013-07-13: 300 mg via RECTAL
  Filled 2013-07-13: qty 1

## 2013-07-13 MED ORDER — CHLORHEXIDINE GLUCONATE 0.12 % MT SOLN
15.0000 mL | Freq: Two times a day (BID) | OROMUCOSAL | Status: DC
  Administered 2013-07-13 – 2013-07-18 (×10): 15 mL via OROMUCOSAL
  Filled 2013-07-13 (×10): qty 15

## 2013-07-13 MED ORDER — SODIUM CHLORIDE 0.9 % IV SOLN
50.0000 ug/h | INTRAVENOUS | Status: DC
  Administered 2013-07-13: 110 ug/h via INTRAVENOUS
  Administered 2013-07-13: 50 ug/h via INTRAVENOUS
  Filled 2013-07-13 (×4): qty 50

## 2013-07-13 MED ORDER — NOREPINEPHRINE BITARTRATE 1 MG/ML IJ SOLN
2.0000 ug/min | Freq: Once | INTRAVENOUS | Status: AC
  Administered 2013-07-13: 5 ug/min via INTRAVENOUS
  Filled 2013-07-13: qty 4

## 2013-07-13 MED ORDER — SUCCINYLCHOLINE CHLORIDE 20 MG/ML IJ SOLN
INTRAMUSCULAR | Status: AC
  Filled 2013-07-13: qty 1

## 2013-07-13 MED ORDER — VANCOMYCIN HCL 10 G IV SOLR
2000.0000 mg | Freq: Once | INTRAVENOUS | Status: AC
  Administered 2013-07-13: 2000 mg via INTRAVENOUS
  Filled 2013-07-13: qty 2000

## 2013-07-13 MED ORDER — BIOTENE DRY MOUTH MT LIQD
15.0000 mL | Freq: Four times a day (QID) | OROMUCOSAL | Status: DC
  Administered 2013-07-14 – 2013-07-18 (×19): 15 mL via OROMUCOSAL

## 2013-07-13 MED ORDER — INSULIN ASPART 100 UNIT/ML ~~LOC~~ SOLN
2.0000 [IU] | SUBCUTANEOUS | Status: DC
  Administered 2013-07-14: 4 [IU] via SUBCUTANEOUS
  Administered 2013-07-15 – 2013-07-18 (×6): 2 [IU] via SUBCUTANEOUS

## 2013-07-13 MED ORDER — VANCOMYCIN HCL IN DEXTROSE 1-5 GM/200ML-% IV SOLN
1000.0000 mg | Freq: Two times a day (BID) | INTRAVENOUS | Status: DC
  Administered 2013-07-14: 1000 mg via INTRAVENOUS
  Filled 2013-07-13 (×2): qty 200

## 2013-07-13 MED ORDER — SODIUM CHLORIDE 0.9 % IV SOLN
1.0000 mg/h | INTRAVENOUS | Status: DC
  Administered 2013-07-13: 2 mg/h via INTRAVENOUS
  Administered 2013-07-13: 5 mg/h via INTRAVENOUS
  Administered 2013-07-14: 6 mg/h via INTRAVENOUS
  Administered 2013-07-14: 2 mg/h via INTRAVENOUS
  Administered 2013-07-15: 1 mg/h via INTRAVENOUS
  Filled 2013-07-13 (×5): qty 10

## 2013-07-13 MED ORDER — FUROSEMIDE 10 MG/ML IJ SOLN
80.0000 mg | Freq: Once | INTRAMUSCULAR | Status: AC
  Administered 2013-07-13: 80 mg via INTRAVENOUS
  Filled 2013-07-13: qty 8

## 2013-07-13 MED ORDER — ETOMIDATE 2 MG/ML IV SOLN
INTRAVENOUS | Status: AC
  Filled 2013-07-13: qty 20

## 2013-07-13 MED ORDER — ETOMIDATE 2 MG/ML IV SOLN
INTRAVENOUS | Status: AC
  Administered 2013-07-13: 20 mg
  Filled 2013-07-13: qty 20

## 2013-07-13 MED ORDER — SODIUM CHLORIDE 0.9 % IV SOLN
250.0000 mL | INTRAVENOUS | Status: DC | PRN
  Administered 2013-07-13: 1000 mL via INTRAVENOUS
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
## 2013-07-13 NOTE — ED Notes (Signed)
PT placed on Venti -Mask AT O2 ml. O2 sats down to 88% . Pt placed on 100% non-rebreather mask.

## 2013-07-13 NOTE — Progress Notes (Signed)
Rate on ventilator increased to 24 per Siqueiros, MD.

## 2013-07-13 NOTE — ED Notes (Signed)
Triple lumen central line inserted by Dr. Wyvonna Plum without difficulty

## 2013-07-13 NOTE — ED Notes (Signed)
Thick copious secretions from the ET tube, copious amounts of clear saliva suctioned from mouth.

## 2013-07-13 NOTE — ED Notes (Signed)
Patient refused foley cath
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
## 2013-07-13 NOTE — Procedures (Signed)
Arterial Catheter Insertion Procedure Note Bryce [name] [id number] 1946/10/12  Procedure: Insertion of Arterial Catheter  Indications: Blood pressure monitoring and Frequent blood sampling  Procedure Details Consent: Unable to obtain consent because of altered level of consciousness. Time Out: Verified patient identification, verified procedure, site/side was marked, verified correct patient position, special equipment/implants available, medications/allergies/relevent history reviewed, required imaging and test results available.  Performed  Maximum sterile technique was used including antiseptics, cap, gloves, gown, hand hygiene, mask and sheet. Skin prep: Chlorhexidine; local anesthetic administered 20 gauge catheter was inserted into left radial artery using the Seldinger technique.  Evaluation Blood flow good; BP tracing good. Complications: No apparent complications.   Ayoub Arey, Peter Garter 07/13/2013
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
## 2013-07-13 NOTE — ED Notes (Signed)
Patient turned to give supp.  Stage 3 opened areas to right buttock cheek and top of right thigh.

## 2013-07-13 NOTE — ED Provider Notes (Signed)
I saw and evaluated the patient, reviewed the resident's note and I agree with the findings and plan.  Date: 07/13/2013  Rate: 117  Rhythm: sinus tachycardia  QRS Axis: normal  Intervals: normal  ST/T Wave abnormalities: nonspecific ST/T changes  Conduction Disutrbances:right bundle branch block  Narrative Interpretation:   Old EKG Reviewed: unchanged  CRITICAL CARE Performed by: Gwyneth Sprout Total critical care time: 45 Critical care time was exclusive of separately billable procedures and treating other patients. Critical care was necessary to treat or prevent imminent or life-threatening deterioration. Critical care was time spent personally by me on the following activities: development of treatment plan with patient and/or surrogate as well as nursing, discussions with consultants, evaluation of patient's response to treatment, examination of patient, obtaining history from patient or surrogate, ordering and performing treatments and interventions, ordering and review of laboratory studies, ordering and review of radiographic studies, pulse oximetry and re-evaluation of patient's condition.  Patient presents for shortness of breath, nonproductive cough with signs concerning for fluid overload. Patient's ABG shows hypercarbia with CO2 110 and a pH of 7.20. After 45 minutes of BiPAP the patient's ABG has not changed and patient is becoming more somnolent. Patient required intubation due to 2 is unchanged gas and respiratory status. He was admitted to the ICU and also treated for CHF exacerbation   Gwyneth Sprout, MD 07/13/13 2325

## 2013-07-14 DIAGNOSIS — J189 Pneumonia, unspecified organism: Secondary | ICD-10-CM | POA: Diagnosis present

## 2013-07-14 DIAGNOSIS — I509 Heart failure, unspecified: Secondary | ICD-10-CM

## 2013-07-14 DIAGNOSIS — J9600 Acute respiratory failure, unspecified whether with hypoxia or hypercapnia: Secondary | ICD-10-CM

## 2013-07-14 DIAGNOSIS — E119 Type 2 diabetes mellitus without complications: Secondary | ICD-10-CM

## 2013-07-14 DIAGNOSIS — I10 Essential (primary) hypertension: Secondary | ICD-10-CM

## 2013-07-14 LAB — CBC
MCH: 30.2 pg (ref 26.0–34.0)
MCHC: 30.9 g/dL (ref 30.0–36.0)
MCV: 97.8 fL (ref 78.0–100.0)
Platelets: 196 10*3/uL (ref 150–400)
RBC: 4.57 MIL/uL (ref 4.22–5.81)
RDW: 16.9 % — ABNORMAL HIGH (ref 11.5–15.5)

## 2013-07-14 LAB — BASIC METABOLIC PANEL
BUN: 14 mg/dL (ref 6–23)
CO2: 39 mEq/L — ABNORMAL HIGH (ref 19–32)
CO2: 40 mEq/L (ref 19–32)
Calcium: 8.5 mg/dL (ref 8.4–10.5)
Calcium: 8.1 mg/dL — ABNORMAL LOW (ref 8.4–10.5)
Creatinine, Ser: 0.84 mg/dL (ref 0.50–1.35)
Creatinine, Ser: 0.92 mg/dL (ref 0.50–1.35)
GFR calc non Af Amer: 90 mL/min — ABNORMAL LOW (ref >90)
Glucose, Bld: 98 mg/dL (ref 70–99)
Sodium: 143 mEq/L (ref 135–145)

## 2013-07-14 LAB — BLOOD GAS, ARTERIAL
Acid-Base Excess: 17 mmol/L — ABNORMAL HIGH (ref 0.0–2.0)
Acid-Base Excess: 17.1 mmol/L — ABNORMAL HIGH (ref 0.0–2.0)
Drawn by: 24513
Drawn by: 39899
FIO2: 80 %
FIO2: 60 %
MECHVT: 500 mL
MECHVT: 500 mL
O2 Saturation: 98.7 %
O2 Saturation: 95.4 %
Patient temperature: 98.6
RATE: 24 resp/min
RATE: 16 resp/min
TCO2: 43 mmol/L (ref 0–100)

## 2013-07-14 LAB — MAGNESIUM: Magnesium: 1.7 mg/dL (ref 1.5–2.5)

## 2013-07-14 LAB — GLUCOSE, CAPILLARY
Glucose-Capillary: 101 mg/dL — ABNORMAL HIGH (ref 70–99)
Glucose-Capillary: 76 mg/dL (ref 70–99)
Glucose-Capillary: 90 mg/dL (ref 70–99)

## 2013-07-14 MED ORDER — POTASSIUM PHOSPHATE DIBASIC 3 MMOLE/ML IV SOLN
30.0000 mmol | Freq: Once | INTRAVENOUS | Status: AC
  Administered 2013-07-14: 30 mmol via INTRAVENOUS
  Filled 2013-07-14: qty 10

## 2013-07-14 MED ORDER — POTASSIUM CHLORIDE 20 MEQ/15ML (10%) PO LIQD
40.0000 meq | ORAL | Status: AC
  Administered 2013-07-14 (×2): 40 meq
  Filled 2013-07-14 (×3): qty 30

## 2013-07-14 MED ORDER — DEXTROSE 5 % IV SOLN
500.0000 mg | INTRAVENOUS | Status: DC
  Administered 2013-07-14 – 2013-07-15 (×2): 500 mg via INTRAVENOUS
  Filled 2013-07-14 (×2): qty 500

## 2013-07-14 MED ORDER — FUROSEMIDE 10 MG/ML IJ SOLN
40.0000 mg | Freq: Two times a day (BID) | INTRAMUSCULAR | Status: AC
  Administered 2013-07-14 (×2): 40 mg via INTRAVENOUS
  Filled 2013-07-14 (×2): qty 4

## 2013-07-14 MED ORDER — DEXTROSE-NACL 5-0.9 % IV SOLN
INTRAVENOUS | Status: DC
  Administered 2013-07-14: via INTRAVENOUS
  Administered 2013-07-18: 500 mL via INTRAVENOUS
  Administered 2013-07-19 (×2): via INTRAVENOUS

## 2013-07-14 NOTE — Progress Notes (Signed)
PULMONARY  / CRITICAL CARE MEDICINE  Name: Bryce [name] MRN: [id number] DOB: [birth year]    ADMISSION DATE:  07/13/2013   PRIMARY SERVICE: PCCM  CHIEF COMPLAINT:  SOB, hypercarbic respiratory failure.  BRIEF PATIENT DESCRIPTION:  67 years old male with PMH relevant for DM, HTN, diastolic CHF and obesity. Presents with progressive SOB. He was found to be in hypercarbic respiratory failure. He was placed on BiPAP but failed and required intubation and mechanical ventilation.    SIGNIFICANT EVENTS / STUDIES:    LINES / TUBES: - Peripheral IV x 1 - Left subclavian CVC 07/13/13 >>  - ETT 07/13/13  >>  CULTURES: Blood cultures 8/3 >>  Urine cultures 8/3 >>  Sputum cultures 8/4 >>   ANTIBIOTICS: Imipenem 07/13/13 >>  Vancomycin 07/13/13 >> 8/4 azithro 8/4 >>   SUBJECTIVE:  Sedated and intubated  VITAL SIGNS: Temp:  [98.8 F (37.1 C)-99.9 F (37.7 C)] 98.8 F (37.1 C) (08/04 0000) Pulse Rate:  [69-115] 81 (08/04 0700) Resp:  [16-26] 16 (08/04 0700) BP: (59-186)/(40-119) 117/60 mmHg (08/04 0700) SpO2:  [94 %-100 %] 95 % (08/04 0916) Arterial Line BP: (102-150)/(56-82) 113/62 mmHg (08/04 0700) FiO2 (%):  [50 %-100 %] 60 % (08/04 0916) Weight:  [118.842 kg (262 lb)-125.6 kg (276 lb 14.4 oz)] 125.6 kg (276 lb 14.4 oz) (08/04 0500) HEMODYNAMICS:   VENTILATOR SETTINGS: Vent Mode:  [-] PRVC FiO2 (%):  [50 %-100 %] 60 % Set Rate:  [12 bmp-24 bmp] 16 bmp Vt Set:  [500 mL] 500 mL PEEP:  [5 cmH20] 5 cmH20 Plateau Pressure:  [24 cmH20-31 cmH20] 28 cmH20 INTAKE / OUTPUT: Intake/Output     08/03 0701 - 08/04 0700 08/04 0701 - 08/05 0700   I.V. (mL/kg) 1658.2 (13.2) 28 (0.2)   NG/GT 20    IV Piggyback 785    Total Intake(mL/kg) 2463.2 (19.6) 28 (0.2)   Urine (mL/kg/hr) 2430 75 (0.2)   Emesis/NG output 150 100 (0.3)   Other 600    Total Output 3180 175   Net -716.9 -147          PHYSICAL EXAMINATION: General: Intubated, sedated, no apparent distress. Eyes: Anicteric  sclerae. ENT: ETT in place. Trachea at midline.  Lymph: No cervical, supraclavicular, or axillary lymphadenopathy. Heart: Normal S1, S2. No murmurs, rubs, or gallops appreciated. No bruits, equal pulses. Lungs: Bilateral diffuse wheezing and crackles. Thick secretions from ETT Abdomen: Abdomen soft, non-tender and not distended, normoactive bowel sounds. No hepatosplenomegaly or masses. Musculoskeletal: No clubbing or synovitis. Bilateral LE edema with chronic skin changes likely related to venous insufficiency. Skin: Chronic skin changes to both LE's  likely related to venous insufficiency  Neuro: Intubated, sedated. At admission was awake and alert with no focal signs.  LABS:  CBC Recent Labs     07/13/13  1620  07/14/13  0445  WBC  7.1  7.4  HGB  16.4  13.8  HCT  55.0*  44.7  PLT  237  196   Coag's No results found for this basename: APTT, INR,  in the last 72 hours BMET Recent Labs     07/13/13  1620  07/14/13  0445  NA  139  144  K  4.8  2.9*  CL  93*  97  CO2  33*  39*  BUN  13  15  CREATININE  0.72  0.84  GLUCOSE  133*  99   Electrolytes Recent Labs     07/13/13  1620  07/14/13  0445  CALCIUM  9.3  8.5  MG   --   1.7  PHOS   --   1.9*   Sepsis Markers No results found for this basename: LACTICACIDVEN, PROCALCITON, O2SATVEN,  in the last 72 hours ABG Recent Labs     07/13/13  1821  07/14/13  0445  07/14/13  0603  PHART  7.243*  7.602*  7.534*  PCO2ART  100.6*  41.1  49.4*  PO2ART  81.0  81.7  72.6*   Liver Enzymes Recent Labs     07/13/13  1620  AST  34  ALT  29  ALKPHOS  130*  BILITOT  0.6  ALBUMIN  3.0*   Cardiac Enzymes Recent Labs     07/13/13  1620  PROBNP  1489.0*   Glucose Recent Labs     07/13/13  1751  07/13/13  2230  07/13/13  2339  07/14/13  0337  07/14/13  0808  GLUCAP  116*  93  76  98  108*    Imaging Dg Chest Portable 1 View  07/13/2013   *RADIOLOGY REPORT*  Clinical Data: Shortness of breath status post  central line placement  PORTABLE CHEST - 1 VIEW  Comparison: 07/13/2013  Findings: A new left subclavian central line is noted.  No pneumothorax is seen.  The tip is noted within the proximal superior vena cava.  An endotracheal tube is again noted 2 cm above the carina.  Nasogastric catheter has been placed within the stomach.  The lungs again demonstrate bibasilar changes stable from prior study.  IMPRESSION: Tubes and lines as described above.  No pneumothorax is noted.   Original Report Authenticated By: Alcide Clever, M.D.   Dg Chest Portable 1 View  07/13/2013   *RADIOLOGY REPORT*  Clinical Data: Shortness of breath, intubation  PORTABLE CHEST - 1 VIEW  Comparison: 07/13/2013  Findings: Endotracheal tube tip at the carina,  recommend retraction 3 cm.  Cardiomegaly evident with diffuse edema pattern and increased atelectasis.  Lower lung volumes noted.  No pneumothorax.  IMPRESSION: Low endotracheal tube at the carina, recommend retraction 3 cm.  Persistent edema pattern with increased atelectasis and lower lung volumes   Original Report Authenticated By: Judie Petit. Shick, M.D.   Dg Chest Portable 1 View  07/13/2013   *RADIOLOGY REPORT*  Clinical Data: Shortness of breath  PORTABLE CHEST - 1 VIEW  Comparison: 02/14/2011  Findings: Cardiomegaly evident with diffuse symmetric airspace disease versus edema throughout the lungs.  CHF is favored.  No large effusion.  There is associated basilar atelectasis.  No pneumothorax.  IMPRESSION: Mild to moderate CHF pattern.  Basilar atelectasis   Original Report Authenticated By: Judie Petit. Shick, M.D.    ASSESSMENT / PLAN:  PULMONARY A: 1) Acute on chronic hypercarbic respiratory failure. Unclear etiology. COPD / pneumonia vs pulm edema/CHF. Bedside echo 8/3 showed a hyperdynamic LV. His IVC looked small and collapsible. WBC is normal but has thick yellowish secretions from the ETT.   P:   - Mechanical ventilation   - PRVC, Vt: 8cc/kg, PEEP: 5, RR: 24, FiO2: 100% and adjust  to keep O2 sat > 94% - VAP prevention order set - Albuterol / ipratropium ordered - cover w abx for CAP pending cx data - diuresis as tolerated, follow CXR    CARDIOVASCULAR A:  1) History of diastolic CHF. Bedside echo with hyperdynamic heart. 2) Hypotension after sedation and lasix in the ED P:  - Will hold home antihypertensives on sedation - Formal echocardiogram pending - Pressors  if needed to keep MAP >65 - diuresis as his BP and renal fxn will tolerate  RENAL A:   1) Hypokalemia P:   - replace K  - Will follow BMP  GASTROINTESTINAL A:   1) No issues P:   - GI prophylaxis with protonix  HEMATOLOGIC A:   1) No issues P:  - Will follow CBC  INFECTIOUS A:   1) Possible CAP 2) Penicillin allergy (unknown severity) P:   - continue Imipenem (w PCN allergy), change vanco to azithro - Will follow cultures and adjust antibiotics according to results  ENDOCRINE A:   1) DM2  P:   - Novolog sliding scale - Hold oral diabetes medications  NEUROLOGIC A:   1) Intubated, sedated. Non focal at admission. P:   - Continuous sedation with Versed and Fentanyl  TODAY'S SUMMARY:   I have personally obtained a history, examined the patient, evaluated laboratory and imaging results, formulated the assessment and plan and placed orders. CRITICAL CARE: Critical Care Time devoted to patient care services described in this note is 30 minutes.   Levy Pupa, MD, PhD 07/14/2013, 9:59 AM [facility] [phone] or if no answer [phone]

## 2013-07-14 NOTE — Progress Notes (Signed)
Utilization review completed. Verne Cove, RN, BSN. 

## 2013-07-14 NOTE — Clinical Social Work Note (Signed)
CSW has been unable to reach family so far.  Family's phone is disconnected.  CSW will continue to follow and assist as needed.

## 2013-07-14 NOTE — Consult Note (Signed)
WOC consult Note Reason for Consult: evaluate right thigh wounds.  Noted to have lymphedema with brawny LE edema bilaterally. No open wounds of the LE currently. He has prophylactic sacral dressing in place (meets criteria).  Has several areas of MASD (moisture associated skin damage) with sheer injury on the right lower buttock, right posterior thigh, and left lower buttock/upper thigh Wound type: MASD with sheer injury Pressure Ulcer POA: no Measurement:  lower buttock and thigh aprox. 4-5cm x 2cm x 0.2cm  Wound bed: all areas on the posterior thighs and buttocks are clean, not necrotic and appear to be healing.  Drainage (amount, consistency, odor) minimal, non purulent Periwound:intact but with maceration noted on the lower buttock wounds bilateral, again probably due to moisture. Dressing procedure/placement/frequency: continue silicone foam dressing for moisture management, insulation, and protection. No treatment needed for LE edema currently, pt needs compression stockings long term.    Re consult if needed, will not follow at this time. Thanks  Micki Cassel [employer], CWOCN [phone])

## 2013-07-14 NOTE — Progress Notes (Signed)
eLink Physician-Brief Progress Note Patient Name: Bryce [name] DOB: [birth year] MRN: [id number]  Date of Service  07/14/2013   HPI/Events of Note  Blood sugar of 76 on no TFs/orals/dextrose   eICU Interventions  Plan: Change to D5NS at [facility] Continue to monitor blood sugar      Sharlet Notaro 07/14/2013, 12:02 AM

## 2013-07-14 NOTE — Progress Notes (Signed)
eLink Physician-Brief Progress Note Patient Name: Bryce [name] DOB: 02/01/1946 MRN: [id number]  Date of Service  07/14/2013   HPI/Events of Note  Hypokalemia and hypophos   eICU Interventions  Potassium and phos replaced   Intervention Category Intermediate Interventions: Electrolyte abnormality - evaluation and management  DETERDING,ELIZABETH 07/14/2013, 6:06 AM

## 2013-07-14 NOTE — Progress Notes (Signed)
  Echocardiogram 2D Echocardiogram has been performed.  Bryce [name] [name] 07/14/2013, 12:53 PM 

## 2013-07-14 NOTE — Procedures (Signed)
Central Venous Catheter Insertion Procedure Note Bryce [name] [id number] 1946-04-08  Procedure: Insertion of Central Venous Catheter Indications: Assessment of intravascular volume, Drug and/or fluid administration and Frequent blood sampling  Procedure Details Consent: Unable to obtain consent because of emergent medical necessity. Time Out: Verified patient identification, verified procedure, site/side was marked, verified correct patient position, special equipment/implants available, medications/allergies/relevent history reviewed, required imaging and test results available.  Performed  Maximum sterile technique was used including antiseptics, cap, gloves, gown, hand hygiene, mask and sheet. Skin prep: Chlorhexidine; local anesthetic administered A antimicrobial bonded/coated triple lumen catheter was placed in the left subclavian vein using the Seldinger technique.  Evaluation Blood flow good Complications: No apparent complications Patient did tolerate procedure well. Chest X-ray ordered to verify placement.  CXR: normal.  Overton Mam, M.D. Pulmonary and Critical Care Medicine Call E-link with questions [phone] 07/14/2013, 2:08 AM

## 2013-07-15 ENCOUNTER — Inpatient Hospital Stay (HOSPITAL_COMMUNITY): Payer: Medicare Other

## 2013-07-15 ENCOUNTER — Encounter (HOSPITAL_COMMUNITY): Payer: Self-pay | Admitting: General Practice

## 2013-07-15 LAB — POCT I-STAT 3, ART BLOOD GAS (G3+)
Acid-Base Excess: 14 mmol/L — ABNORMAL HIGH (ref 0.0–2.0)
Bicarbonate: 47.8 mEq/L — ABNORMAL HIGH (ref 20.0–24.0)
O2 Saturation: 91 %
TCO2: >50 mmol/L (ref 0–100)
TCO2: 48 mmol/L (ref 0–100)
pCO2 arterial: 115.8 mmHg (ref 35.0–45.0)
pCO2 arterial: 94.6 mmHg (ref 35.0–45.0)
pH, Arterial: 7.282 — ABNORMAL LOW (ref 7.350–7.450)
pO2, Arterial: 80 mmHg (ref 80.0–100.0)
pO2, Arterial: 81 mmHg (ref 80.0–100.0)

## 2013-07-15 LAB — BASIC METABOLIC PANEL
BUN: 13 mg/dL (ref 6–23)
CO2: 39 mEq/L — ABNORMAL HIGH (ref 19–32)
Chloride: 100 mEq/L (ref 96–112)
Chloride: 97 mEq/L (ref 96–112)
Creatinine, Ser: 0.92 mg/dL (ref 0.50–1.35)
Creatinine, Ser: 0.91 mg/dL (ref 0.50–1.35)
GFR calc Af Amer: >90 mL/min (ref >90)
Glucose, Bld: 92 mg/dL (ref 70–99)
Glucose, Bld: 115 mg/dL — ABNORMAL HIGH (ref 70–99)
Potassium: 3.7 mEq/L (ref 3.5–5.1)

## 2013-07-15 LAB — URINE CULTURE: Culture: NO GROWTH

## 2013-07-15 LAB — GLUCOSE, CAPILLARY
Glucose-Capillary: 100 mg/dL — ABNORMAL HIGH (ref 70–99)
Glucose-Capillary: 102 mg/dL — ABNORMAL HIGH (ref 70–99)
Glucose-Capillary: 72 mg/dL (ref 70–99)
Glucose-Capillary: 80 mg/dL (ref 70–99)

## 2013-07-15 LAB — CULTURE, RESPIRATORY W GRAM STAIN

## 2013-07-15 LAB — CBC
HCT: 43.8 % (ref 39.0–52.0)
Hemoglobin: 13 g/dL (ref 13.0–17.0)
MCH: 29.8 pg (ref 26.0–34.0)
RBC: 4.36 MIL/uL (ref 4.22–5.81)

## 2013-07-15 LAB — MAGNESIUM: Magnesium: 1.7 mg/dL (ref 1.5–2.5)

## 2013-07-15 MED ORDER — MIDAZOLAM HCL 2 MG/2ML IJ SOLN: 2.0000 mg | Freq: Once | INTRAMUSCULAR | Status: AC

## 2013-07-15 MED ORDER — ROCURONIUM BROMIDE 50 MG/5ML IV SOLN
80.0000 mg | Freq: Once | INTRAVENOUS | Status: AC
  Administered 2013-07-15: 80 mg via INTRAVENOUS

## 2013-07-15 MED ORDER — ROCURONIUM BROMIDE 50 MG/5ML IV SOLN
80.0000 mg | Freq: Once | INTRAVENOUS | Status: DC
  Filled 2013-07-15: qty 8

## 2013-07-15 MED ORDER — DOPAMINE-DEXTROSE 3.2-5 MG/ML-% IV SOLN
2.0000 ug/kg/min | INTRAVENOUS | Status: DC
  Administered 2013-07-16: 2 ug/kg/min via INTRAVENOUS
  Filled 2013-07-15: qty 250

## 2013-07-15 MED ORDER — ETOMIDATE 2 MG/ML IV SOLN
10.0000 mg | Freq: Once | INTRAVENOUS | Status: AC
  Administered 2013-07-15: 10 mg via INTRAVENOUS

## 2013-07-15 MED ORDER — FENTANYL CITRATE 0.05 MG/ML IJ SOLN
INTRAMUSCULAR | Status: AC
  Administered 2013-07-15: 100 ug via INTRAVENOUS
  Filled 2013-07-15: qty 2

## 2013-07-15 MED ORDER — MIDAZOLAM HCL 2 MG/2ML IJ SOLN
INTRAMUSCULAR | Status: AC
  Administered 2013-07-15: 2 mg
  Filled 2013-07-15: qty 2

## 2013-07-15 MED ORDER — MIDAZOLAM HCL 2 MG/2ML IJ SOLN
INTRAMUSCULAR | Status: AC
  Administered 2013-07-15: 2 mg via INTRAVENOUS
  Filled 2013-07-15: qty 2

## 2013-07-15 MED ORDER — HALOPERIDOL LACTATE 5 MG/ML IJ SOLN
10.0000 mg | Freq: Once | INTRAMUSCULAR | Status: AC
  Administered 2013-07-16: 10 mg via INTRAVENOUS
  Filled 2013-07-15: qty 2

## 2013-07-15 MED ORDER — FUROSEMIDE 10 MG/ML IJ SOLN
40.0000 mg | Freq: Three times a day (TID) | INTRAMUSCULAR | Status: DC
  Administered 2013-07-15 – 2013-07-18 (×8): 40 mg via INTRAVENOUS
  Filled 2013-07-15 (×12): qty 4

## 2013-07-15 MED ORDER — FENTANYL CITRATE 0.05 MG/ML IJ SOLN
INTRAMUSCULAR | Status: AC
  Filled 2013-07-15: qty 2

## 2013-07-15 MED ORDER — FENTANYL CITRATE 0.05 MG/ML IJ SOLN: 100.0000 ug | Freq: Once | INTRAMUSCULAR | Status: AC

## 2013-07-15 MED ORDER — DEXTROSE 5 % IV SOLN
1.0000 g | INTRAVENOUS | Status: DC
  Administered 2013-07-15 – 2013-07-20 (×6): 1 g via INTRAVENOUS
  Filled 2013-07-15 (×7): qty 10

## 2013-07-15 NOTE — Procedures (Signed)
Intubation Procedure Note ALESANDRO STUEVE [id number] 1946/09/24  Procedure: Intubation Indications: Airway protection and maintenance  Procedure Details Consent: Unable to obtain consent because of emergent medical necessity. Time Out: Verified patient identification, verified procedure, site/side was marked, verified correct patient position, special equipment/implants available, medications/allergies/relevent history reviewed, required imaging and test results available.  Performed  Maximum sterile technique was used including antiseptics, gloves, hand hygiene and mask.  MAC    Evaluation Hemodynamic Status: BP stable throughout; O2 sats: stable throughout Patient's Current Condition: stable Complications: No apparent complications Patient did tolerate procedure well. Chest X-ray ordered to verify placement.  CXR: pending.   Koren Bound 07/15/2013

## 2013-07-15 NOTE — Progress Notes (Signed)
eLink Physician-Brief Progress Note Patient Name: Bryce [name] DOB: [birth year] MRN: [id number]  Date of Service  07/15/2013   HPI/Events of Note   Self extub, awake, mild distress  eICU Interventions  100%, abg to follow ,uproght psotion, close observation   Intervention Category Major Interventions: Hypoxemia - evaluation and management  Nelda Bucks. 07/15/2013, 3:29 PM
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
## 2013-07-15 NOTE — Progress Notes (Signed)
Pt placed on BiPAP per MD order. RT will monitor.

## 2013-07-15 NOTE — Progress Notes (Signed)
CRITICAL VALUE ALERT  Critical value received:  CO2 39  Date of notification:  07/15/2013  Time of notification:  6:50 PM  Critical value read back:yes  Nurse who received alert:  Vassie Moselle  MD notified (1st page):  feinstein  Time of first page:  6:51 PM  MD notified (2nd page):  Time of second page:  Responding MD:  feinstein  Time MD responded:  6:51 PM

## 2013-07-15 NOTE — Care Management Note (Signed)
    Page 1 of 2   07/23/2013     4:00:02 PM   CARE MANAGEMENT NOTE 07/23/2013  Patient:  Bryce [name], Bryce [name]   Account Number:  [id number]  Date Initiated:  07/15/2013  Documentation initiated by:  Bryce [name]  Subjective/Objective Assessment:   Admitted with resp failure - failed bipap - intubated.     Action/Plan:   pt eval- rec snf, pt refusing sng, patient has capacity per physch.   Anticipated DC Date:  07/23/2013   Anticipated DC Plan:  SKILLED NURSING FACILITY  In-house referral  Clinical Social Worker      DC Associate Professor  CM consult      [name]  HOME [facility]   Choice offered to / List presented to:  C-1 Patient        HH arranged  HH-1 RN  HH-2 PT  HH-3 OT  HH-6 SOCIAL WORKER      HH agency  [facility]   Status of service:  Completed, signed off Medicare Important Message given?   (If response is "NO", the following Medicare IM given date fields will be blank) Date Medicare IM given:   Date Additional Medicare IM given:    Discharge Disposition:  HOME W HOME HEALTH SERVICES  Per UR Regulation:  Reviewed for med. necessity/level of care/duration of stay  If discussed at Long Length of Stay Meetings, dates discussed:    Comments:  Contact: Sister Bryce [name], Bryce [name]  [phone]  07/23/13 15:58 Bryce Cape RN, BSN [phone] patient dc to home via ambulance, [name] notified.  07/22/13 15:04 Bryce Cape RN, BSN [phone] patient chose [name] for [name] services, referral made to [name] for [place] [name], HHPT, OT and social work, since patient has an aide with pcs services already that stays with him 3hrs /day seven days a week.  Also patient states his nephew Bryce [name] will be staying with him.  Per physch patient has capacity. Patient states his aide, [facility]ce [name] takes him to his doctors appt, so he can be transported via car.  Patient also has oxygen through [name].  Patient has medication coverage.  NCM will continue to follow for dc needs.  Also patient set up  with [facility]ce [name], [facility]ce [name] came to see patient and agreed to their services as well.  07/21/13 17:25 Bryce Cape RN, BSN [phone] pysch consulted for capacity, CSW follow for snf.  07-18-13 2pm Bryce [name], RNBSN [phone] Extubated on 07-17-13.  Still hard to understand.  Could understand that he lives at home - has someone that assists him other than the sitter that is with him 3 hours per day. Gets around with a walker and w/c - battery operated. States [name] assists him - number [phone] but number is disconnected.  Cannot remember what agency sees him for 3 hours per day.  CM will continue to follow.  07-15-13 2:30pm [name], RNBSN - [phone] Talked with sister, Bryce [name] on phone.  Patient lives at home alone but has an "aide" come in 6-7 days a week.  Not sure what agency or how many hours each day.  Discussed rehab options.  CM will continue to follow.  At this time patient still on vent.

## 2013-07-15 NOTE — Progress Notes (Signed)
Pt self extubated at this time. Pt was placed on 100% NRB. MD notified. RT will monitor.

## 2013-07-15 NOTE — Clinical Social Work Note (Signed)
Per RN family has visited.  Pt lives at home alone but has a [facility] aide assist him twice a day.   CSW will sign off. Re-consult if pt needs higher level of care at discharge.

## 2013-07-15 NOTE — Progress Notes (Signed)
Pt self-extubated at 1530. Pt was 70% on RA. Pt was put on a NRB 15L/min and elink was called. Dr. Tyson Alias wanted ABG and then put on BiPAP based on ABG readings. Dr. Molli Knock came to assess patient and decided to intubate.

## 2013-07-15 NOTE — Progress Notes (Signed)
Pt reintubated at this time. Pt was placed on vent with previous vent settings. RT will monitor.

## 2013-07-15 NOTE — Progress Notes (Signed)
PULMONARY  / CRITICAL CARE MEDICINE  Name: Bryce [name] MRN: [id number] DOB: [birth year]    ADMISSION DATE:  07/13/2013   PRIMARY SERVICE: PCCM  CHIEF COMPLAINT:  SOB, hypercarbic respiratory failure.  BRIEF PATIENT DESCRIPTION:  67 years old male with PMH relevant for DM, HTN, diastolic CHF and obesity. Presents with progressive SOB. He was found to be in hypercarbic respiratory failure. He was placed on BiPAP but failed and required intubation and mechanical ventilation.    SIGNIFICANT EVENTS / STUDIES:  TTE 8/4 >> LVEF 55-60%, hypertrophy, mod dilated RV  LINES / TUBES: - Left subclavian CVC 07/13/13 >>  - ETT 07/13/13  >>  CULTURES: Blood cultures 8/3 >>  Urine cultures 8/3 >> negative Sputum cultures 8/4 >> moraxella (b-lactamase +)  ANTIBIOTICS: Imipenem 07/13/13 >> 8/5 Vancomycin 07/13/13 >> 8/4 azithro 8/4 >> 8/5 Ceftriaxone 8/5 >>   SUBJECTIVE:  Sedated and intubated  VITAL SIGNS: Temp:  [98.2 F (36.8 C)-99.5 F (37.5 C)] 99.1 F (37.3 C) (08/05 1100) Pulse Rate:  [70-93] 74 (08/05 1100) Resp:  [12-19] 16 (08/05 1100) BP: (83-121)/(48-81) 97/55 mmHg (08/05 1100) SpO2:  [92 %-100 %] 97 % (08/05 1100) Arterial Line BP: (86-136)/(56-91) 97/68 mmHg (08/05 0000) FiO2 (%):  [40 %-50 %] 40 % (08/05 1100) Weight:  [126.2 kg (278 lb 3.5 oz)] 126.2 kg (278 lb 3.5 oz) (08/05 0430) HEMODYNAMICS:   VENTILATOR SETTINGS: Vent Mode:  [-] PRVC FiO2 (%):  [40 %-50 %] 40 % Set Rate:  [12 bmp] 12 bmp Vt Set:  [500 mL] 500 mL PEEP:  [5 cmH20] 5 cmH20 Plateau Pressure:  [20 cmH20-26 cmH20] 26 cmH20 INTAKE / OUTPUT: Intake/Output     08/04 0701 - 08/05 0700 08/05 0701 - 08/06 0700   I.V. (mL/kg) 922.3 (7.3) 238 (1.9)   NG/GT 30    IV Piggyback 300 500   Total Intake(mL/kg) 1252.3 (9.9) 738 (5.8)   Urine (mL/kg/hr) 1700 (0.6) 115 (0.2)   Emesis/NG output 100 (0)    Other     Total Output 1800 115   Net -547.8 +623          PHYSICAL EXAMINATION: General: Intubated,  sedated, no apparent distress. Eyes: Anicteric sclerae. ENT: ETT in place. Trachea at midline.  Lymph: No cervical, supraclavicular, or axillary lymphadenopathy. Heart: Normal S1, S2. No murmurs, rubs, or gallops appreciated. No bruits, equal pulses. Lungs: Bilateral diffuse wheezing and crackles.  Abdomen: Abdomen soft, non-tender and not distended, normoactive bowel sounds. No hepatosplenomegaly or masses. Musculoskeletal: No clubbing or synovitis. Bilateral LE edema with chronic skin changes likely related to venous insufficiency. Skin: Chronic skin changes to both LE's  likely related to venous insufficiency  Neuro: Intubated, sedated. At admission was awake and alert with no focal signs.  LABS:  CBC Recent Labs     07/13/13  1620  07/14/13  0445  07/15/13  0414  WBC  7.1  7.4  4.9  HGB  16.4  13.8  13.0  HCT  55.0*  44.7  43.8  PLT  237  196  219   Coag's No results found for this basename: APTT, INR,  in the last 72 hours BMET Recent Labs     07/14/13  0445  07/14/13  2000  07/15/13  0414  NA  144  143  144  K  2.9*  4.1  3.6  CL  97  99  100  CO2  39*  40*  39*  BUN  15  14  14  CREATININE  0.84  0.92  0.92  GLUCOSE  99  98  92   Electrolytes Recent Labs     07/14/13  0445  07/14/13  2000  07/15/13  0414  CALCIUM  8.5  8.1*  7.8*  MG  1.7   --   1.7  PHOS  1.9*   --   4.6   Sepsis Markers No results found for this basename: LACTICACIDVEN, PROCALCITON, O2SATVEN,  in the last 72 hours ABG Recent Labs     07/13/13  1821  07/14/13  0445  07/14/13  0603  PHART  7.243*  7.602*  7.534*  PCO2ART  100.6*  41.1  49.4*  PO2ART  81.0  81.7  72.6*   Liver Enzymes Recent Labs     07/13/13  1620  AST  34  ALT  29  ALKPHOS  130*  BILITOT  0.6  ALBUMIN  3.0*   Cardiac Enzymes Recent Labs     07/13/13  1620  PROBNP  1489.0*   Glucose Recent Labs     07/14/13  1210  07/14/13  1534  07/14/13  1956  07/14/13  2333  07/15/13  0429  07/15/13  0800   GLUCAP  181*  101*  90  72  80  75    Imaging Dg Chest Port 1 View  07/15/2013   *RADIOLOGY REPORT*  Clinical Data: Ventilator.  PORTABLE CHEST - 1 VIEW  Comparison: 07/13/2013  Findings: Support devices are unchanged.  Cardiomegaly with vascular congestion.  Bilateral perihilar and lower lobe opacities could reflect edema or pneumonia.  Suspect small left pleural effusion.  No real change since prior study.  IMPRESSION: Bilateral perihilar and lower lobe opacities, question edema versus infection.  Small left effusion.   Original Report Authenticated By: Charlett Nose, M.D.   Dg Chest Portable 1 View  07/13/2013   *RADIOLOGY REPORT*  Clinical Data: Shortness of breath status post central line placement  PORTABLE CHEST - 1 VIEW  Comparison: 07/13/2013  Findings: A new left subclavian central line is noted.  No pneumothorax is seen.  The tip is noted within the proximal superior vena cava.  An endotracheal tube is again noted 2 cm above the carina.  Nasogastric catheter has been placed within the stomach.  The lungs again demonstrate bibasilar changes stable from prior study.  IMPRESSION: Tubes and lines as described above.  No pneumothorax is noted.   Original Report Authenticated By: Alcide Clever, M.D.   Dg Chest Portable 1 View  07/13/2013   *RADIOLOGY REPORT*  Clinical Data: Shortness of breath, intubation  PORTABLE CHEST - 1 VIEW  Comparison: 07/13/2013  Findings: Endotracheal tube tip at the carina,  recommend retraction 3 cm.  Cardiomegaly evident with diffuse edema pattern and increased atelectasis.  Lower lung volumes noted.  No pneumothorax.  IMPRESSION: Low endotracheal tube at the carina, recommend retraction 3 cm.  Persistent edema pattern with increased atelectasis and lower lung volumes   Original Report Authenticated By: Judie Petit. Shick, M.D.   Dg Chest Portable 1 View  07/13/2013   *RADIOLOGY REPORT*  Clinical Data: Shortness of breath  PORTABLE CHEST - 1 VIEW  Comparison: 02/14/2011  Findings:  Cardiomegaly evident with diffuse symmetric airspace disease versus edema throughout the lungs.  CHF is favored.  No large effusion.  There is associated basilar atelectasis.  No pneumothorax.  IMPRESSION: Mild to moderate CHF pattern.  Basilar atelectasis   Original Report Authenticated By: Judie Petit. Miles Costain, M.D.    ASSESSMENT /  PLAN:  PULMONARY A: 1) Acute on chronic hypercarbic respiratory failure. Unclear etiology. COPD / pneumonia vs pulm edema/CHF. Bedside echo 8/3 showed a hyperdynamic LV. His IVC looked small and collapsible. WBC is normal but has thick yellowish secretions from the ETT.   P:   - Mechanical ventilation   - PRVC, Vt: 8cc/kg, PEEP: 5, RR: 24, FiO2: 100% and adjust to keep O2 sat > 94% - VAP prevention order set - Albuterol / ipratropium ordered - abx as below - diuresis as tolerated, follow CXR    CARDIOVASCULAR A:  1) History of diastolic CHF. Bedside echo with hyperdynamic heart. 2) Hypotension after sedation and lasix in the ED P:  - Will hold home antihypertensives on sedation - Pressors if needed to keep MAP >65 - diuresis as his BP and renal fxn will tolerate; add lasix 40mg  q8h on 8/5  RENAL A:   1) Hypokalemia P:   - replace K  - Will follow BMP  GASTROINTESTINAL A:   1) No issues P:   - GI prophylaxis with protonix - start TF if not extubated 8/5  HEMATOLOGIC A:   1) No issues P:  - Will follow CBC  INFECTIOUS A:   1) Possible CAP, moraxella   2) Penicillin allergy (unknown severity) P:   - change imipenem to ceftriaxone, caution of PCN allergy - d/c azithro  ENDOCRINE A:   1) DM2  P:   - Novolog sliding scale - Hold oral diabetes medications  NEUROLOGIC A:   1) Intubated, sedated. Non focal at admission. P:   - Continuous sedation with Versed and Fentanyl  TODAY'S SUMMARY:   I have personally obtained a history, examined the patient, evaluated laboratory and imaging results, formulated the assessment and plan and placed  orders. CRITICAL CARE: Critical Care Time devoted to patient care services described in this note is 35 minutes.   Levy Pupa, MD, PhD 07/15/2013, 11:33 AM [place] [facility] [facility] [phone] or if no answer [phone]

## 2013-07-16 ENCOUNTER — Inpatient Hospital Stay (HOSPITAL_COMMUNITY): Payer: Medicare Other

## 2013-07-16 LAB — MAGNESIUM: Magnesium: 1.6 mg/dL (ref 1.5–2.5)

## 2013-07-16 LAB — POCT I-STAT 3, ART BLOOD GAS (G3+)
Bicarbonate: 37.6 mEq/L — ABNORMAL HIGH (ref 20.0–24.0)
pH, Arterial: 7.482 — ABNORMAL HIGH (ref 7.350–7.450)
pO2, Arterial: 37 mmHg — CL (ref 80.0–100.0)

## 2013-07-16 LAB — GLUCOSE, CAPILLARY
Glucose-Capillary: 106 mg/dL — ABNORMAL HIGH (ref 70–99)
Glucose-Capillary: 109 mg/dL — ABNORMAL HIGH (ref 70–99)
Glucose-Capillary: 137 mg/dL — ABNORMAL HIGH (ref 70–99)
Glucose-Capillary: 88 mg/dL (ref 70–99)

## 2013-07-16 LAB — BLOOD GAS, ARTERIAL
Acid-Base Excess: 14.1 mmol/L — ABNORMAL HIGH (ref 0.0–2.0)
FIO2: 0.5 %
MECHVT: 500 mL
O2 Saturation: 88.2 %
TCO2: 39.9 mmol/L (ref 0–100)
pCO2 arterial: 49.1 mmHg — ABNORMAL HIGH (ref 35.0–45.0)
pO2, Arterial: 52.3 mmHg — ABNORMAL LOW (ref 80.0–100.0)

## 2013-07-16 LAB — CBC
HCT: 43.4 % (ref 39.0–52.0)
Hemoglobin: 13.1 g/dL (ref 13.0–17.0)
MCHC: 30.2 g/dL (ref 30.0–36.0)
RBC: 4.35 MIL/uL (ref 4.22–5.81)
WBC: 6.9 10*3/uL (ref 4.0–10.5)

## 2013-07-16 LAB — BASIC METABOLIC PANEL
CO2: 35 mEq/L — ABNORMAL HIGH (ref 19–32)
Calcium: 8 mg/dL — ABNORMAL LOW (ref 8.4–10.5)
Chloride: 101 mEq/L (ref 96–112)
Glucose, Bld: 94 mg/dL (ref 70–99)
Potassium: 2.9 mEq/L — ABNORMAL LOW (ref 3.5–5.1)
Sodium: 145 mEq/L (ref 135–145)

## 2013-07-16 LAB — PHOSPHORUS: Phosphorus: 1.9 mg/dL — ABNORMAL LOW (ref 2.3–4.6)

## 2013-07-16 MED ORDER — DEXMEDETOMIDINE HCL IN NACL 200 MCG/50ML IV SOLN
0.2000 ug/kg/h | INTRAVENOUS | Status: DC
  Administered 2013-07-16: 0.2 ug/kg/h via INTRAVENOUS
  Administered 2013-07-16 (×2): 0.3 ug/kg/h via INTRAVENOUS
  Filled 2013-07-16 (×3): qty 50

## 2013-07-16 MED ORDER — POTASSIUM CHLORIDE 20 MEQ/15ML (10%) PO LIQD
40.0000 meq | Freq: Once | ORAL | Status: AC
  Administered 2013-07-16: 40 meq via ORAL
  Filled 2013-07-16: qty 30

## 2013-07-16 MED ORDER — DEXMEDETOMIDINE HCL IN NACL 200 MCG/50ML IV SOLN
0.2000 ug/kg/h | INTRAVENOUS | Status: DC
  Administered 2013-07-16 (×2): 0.4 ug/kg/h via INTRAVENOUS
  Administered 2013-07-17 (×2): 0.5 ug/kg/h via INTRAVENOUS
  Administered 2013-07-17: 0.3 ug/kg/h via INTRAVENOUS
  Filled 2013-07-16 (×6): qty 50

## 2013-07-16 MED ORDER — PRO-STAT SUGAR FREE PO LIQD
60.0000 mL | Freq: Every day | ORAL | Status: DC
  Administered 2013-07-16 – 2013-07-17 (×5): 60 mL
  Filled 2013-07-16 (×9): qty 60

## 2013-07-16 MED ORDER — FREE WATER
200.0000 mL | Freq: Three times a day (TID) | Status: DC
  Administered 2013-07-16 – 2013-07-17 (×3): 200 mL

## 2013-07-16 MED ORDER — MIDAZOLAM HCL 2 MG/2ML IJ SOLN: 2.0000 mg | INTRAMUSCULAR | Status: DC | PRN

## 2013-07-16 MED ORDER — FENTANYL CITRATE 0.05 MG/ML IJ SOLN
25.0000 ug | INTRAMUSCULAR | Status: DC | PRN
  Administered 2013-07-16 – 2013-07-17 (×4): 50 ug via INTRAVENOUS
  Filled 2013-07-16 (×4): qty 2

## 2013-07-16 MED ORDER — VITAL AF 1.2 CAL PO LIQD
1000.0000 mL | ORAL | Status: DC
  Administered 2013-07-16: 1000 mL
  Filled 2013-07-16 (×2): qty 1000

## 2013-07-16 MED ORDER — HALOPERIDOL LACTATE 5 MG/ML IJ SOLN
10.0000 mg | Freq: Once | INTRAMUSCULAR | Status: AC
  Administered 2013-07-16: 10 mg via INTRAVENOUS
  Filled 2013-07-16: qty 2

## 2013-07-16 MED ORDER — POTASSIUM PHOSPHATE DIBASIC 3 MMOLE/ML IV SOLN
30.0000 mmol | Freq: Once | INTRAVENOUS | Status: AC
  Administered 2013-07-16: 30 mmol via INTRAVENOUS
  Filled 2013-07-16: qty 10

## 2013-07-16 NOTE — Progress Notes (Signed)
[facility] ADULT ICU REPLACEMENT PROTOCOL FOR AM LAB REPLACEMENT ONLY  The patient does apply for the [facility] Adult ICU Electrolyte Replacment Protocol based on the criteria listed below:   1. Is GFR >/= 40 ml/min? yes  Patient's GFR today is >90 2. Is urine output >/= 0.5 ml/kg/hr for the last 6 hours? yes Patient's UOP is 1.1 ml/kg/hr 3. Is BUN < 60 mg/dL? yes  Patient's BUN today is 11 4. Abnormal electrolyte(s):Potassium and Phosphate 5. Ordered repletion with: Per Protocol    Bryce [name] P 07/16/2013 6:05 AM

## 2013-07-16 NOTE — Progress Notes (Signed)
NUTRITION FOLLOW UP  Intervention:    1. Initiate Vital AF 1.2 @ 20 ml/hr (goal rate).   2. 60 ml Prostat five times per day.   At goal rate, tube feeding regimen will provide 1576 kcal (22 kcal/kg), 186 grams of protein, and 389 ml of H2O.    Nutrition Dx:   Inadequate oral intake related to inability to eat as evidenced by NPO status; ongoing.    Goal:  Enteral nutrition to provide 60-70% of estimated calorie needs (22-25 kcals/kg ideal body weight) and 100% of estimated protein needs, based on ASPEN guidelines for permissive underfeeding in critically ill obese individuals, not yet met.   Monitor:  Vent status, weigh trend, plan of care  Assessment:   Pt admitted with acute on chronic hypercarbic respiratory failure with unclear etiology (COPD/PNA vs pulmonary edema/CHF). Pt discussed during ICU rounds and with RN.  Patient is currently intubated on ventilator support.  MV: 9.9 L/min Temp:Temp (24hrs), Avg:99.2 F (37.3 C), Min:98.7 F (37.1 C), Max:99.8 F (37.7 C)  Propofol: none  Height: Ht Readings from Last 1 Encounters:  07/13/13 5' 9.02" (1.753 m)    Weight Status:   Wt Readings from Last 1 Encounters:  07/16/13 247 lb 2.2 oz (112.1 kg)  Admission weight 275 lb   Re-estimated needs:  Kcal: 2160 Protein: >/= 181 grams  Fluid: > 2 L/day  Skin: MASD (moisture associated skin damage) with sheer injury; lymphedema with brawny LE edema; per WOC note 8/4  Diet Order:   NPO   Intake/Output Summary (Last 24 hours) at 07/16/13 1122 Last data filed at 07/16/13 1102  Gross per 24 hour  Intake 1976.07 ml  Output   5580 ml  Net -3603.93 ml    Last BM: 8/5   Labs:   Recent Labs Lab 07/14/13 0445  07/15/13 0414 07/15/13 1800 07/16/13 0442  NA 144  < > 144 143 145  K 2.9*  < > 3.6 3.7 2.9*  CL 97  < > 100 97 101  CO2 39*  < > 39* 40* 35*  BUN 15  < > 14 13 11   CREATININE 0.84  < > 0.92 0.91 0.84  CALCIUM 8.5  < > 7.8* 7.7* 8.0*  MG 1.7  --  1.7   --  1.6  PHOS 1.9*  --  4.6  --  1.9*  GLUCOSE 99  < > 92 115* 94  < > = values in this interval not displayed.  CBG (last 3)   Recent Labs  07/15/13 2352 07/16/13 0420 07/16/13 0743  GLUCAP 88 88 137*    Scheduled Meds: . albuterol  4 puff Inhalation Q4H  . antiseptic oral rinse  15 mL Mouth Rinse QID  . cefTRIAXone (ROCEPHIN)  IV  1 g Intravenous Q24H  . chlorhexidine  15 mL Mouth Rinse BID  . furosemide  40 mg Intravenous Q8H  . heparin  5,000 Units Subcutaneous Q8H  . insulin aspart  2-6 Units Subcutaneous Q4H  . ipratropium  4 puff Inhalation Q4H  . pantoprazole (PROTONIX) IV  40 mg Intravenous QHS  . potassium phosphate IVPB (mmol)  30 mmol Intravenous Once    Continuous Infusions: . dexmedetomidine    . dextrose 5 % and 0.9% NaCl 50 mL/hr at 07/16/13 0800  . DOPamine Stopped (07/16/13 0900)  . fentaNYL infusion INTRAVENOUS Stopped (07/16/13 0100)  . midazolam (VERSED) infusion Stopped (07/16/13 0100)    Kendell Bane RD, LDN, CNSC [phone] Pager [phone] After Hours Pager

## 2013-07-16 NOTE — Progress Notes (Signed)
eLink Physician-Brief Progress Note Patient Name: Bryce [name] DOB: 1946-07-18 MRN: [id number]  Date of Service  07/16/2013   HPI/Events of Note  Hypokalemia and hypophos   eICU Interventions  Potassium and phos replaced   Intervention Category Intermediate Interventions: Electrolyte abnormality - evaluation and management  DETERDING,ELIZABETH 07/16/2013, 6:11 AM

## 2013-07-16 NOTE — Progress Notes (Signed)
PULMONARY  / CRITICAL CARE MEDICINE  Name: Bryce [name] MRN: [id number] DOB: 27-Nov-1946    ADMISSION DATE:  07/13/2013   PRIMARY SERVICE: PCCM  CHIEF COMPLAINT:  SOB, hypercarbic respiratory failure.  BRIEF PATIENT DESCRIPTION:  67 years old male with PMH relevant for DM, HTN, diastolic CHF and obesity. Presents with progressive SOB. He was found to be in hypercarbic respiratory failure. He was placed on BiPAP but failed and required intubation and mechanical ventilation.    SIGNIFICANT EVENTS / STUDIES:  TTE 8/4 >> LVEF 55-60%, hypertrophy, mod dilated RV  LINES / TUBES: - Left subclavian CVC 07/13/13 >>  - ETT 07/13/13  >> 8/5 (self extub) - ETT 8/5 >>   CULTURES: Blood cultures 8/3 >>  Urine cultures 8/3 >> negative Sputum cultures 8/4 >> moraxella (b-lactamase +)  ANTIBIOTICS: Imipenem 07/13/13 >> 8/5 Vancomycin 07/13/13 >> 8/4 azithro 8/4 >> 8/5 Ceftriaxone 8/5 >>   SUBJECTIVE:  Self extubated, required reintubation 8/5; was temporarily on dopa 8/5 pm when sedation increased Sedated and intubated, but more awake than 8/5 on precedex  VITAL SIGNS: Temp:  [98.7 F (37.1 C)-99.8 F (37.7 C)] 98.9 F (37.2 C) (08/06 1000) Pulse Rate:  [58-136] 64 (08/06 1100) Resp:  [12-33] 12 (08/06 1100) BP: (81-130)/(44-77) 85/58 mmHg (08/06 1100) SpO2:  [91 %-100 %] 98 % (08/06 1100) FiO2 (%):  [40 %-100 %] 60 % (08/06 1100) Weight:  [112.1 kg (247 lb 2.2 oz)] 112.1 kg (247 lb 2.2 oz) (08/06 0500) HEMODYNAMICS:   VENTILATOR SETTINGS: Vent Mode:  [-] PRVC FiO2 (%):  [40 %-100 %] 60 % Set Rate:  [10 bmp-20 bmp] 12 bmp Vt Set:  [500 mL] 500 mL PEEP:  [5 cmH20-8 cmH20] 8 cmH20 Plateau Pressure:  [23 cmH20-27 cmH20] 23 cmH20 INTAKE / OUTPUT: Intake/Output     08/05 0701 - 08/06 0700 08/06 0701 - 08/07 0700   I.V. (mL/kg) 1412 (12.6) 242.1 (2.2)   NG/GT     IV Piggyback 1060    Total Intake(mL/kg) 2472 (22.1) 242.1 (2.2)   Urine (mL/kg/hr) 5120 (1.9) 575 (1.2)   Emesis/NG  output     Total Output 5120 575   Net -2648 -332.9          PHYSICAL EXAMINATION: General: Intubated, no apparent distress. Eyes: Anicteric sclerae. ENT: ETT in place. Trachea at midline.  Lymph: No cervical, supraclavicular, or axillary lymphadenopathy. Heart: Normal S1, S2. No murmurs, rubs, or gallops appreciated. No bruits, equal pulses. Lungs: Bilateral crackles.  Abdomen: Abdomen soft, non-tender and not distended, normoactive bowel sounds. No hepatosplenomegaly or masses. Musculoskeletal: No clubbing or synovitis. Bilateral LE edema with chronic skin changes likely related to venous insufficiency. Skin: Chronic skin changes to both LE's  likely related to venous insufficiency  Neuro: awake and nodding appropriately to questions.   LABS:  CBC Recent Labs     07/14/13  0445  07/15/13  0414  07/16/13  0442  WBC  7.4  4.9  6.9  HGB  13.8  13.0  13.1  HCT  44.7  43.8  43.4  PLT  196  219  210   Coag's No results found for this basename: APTT, INR,  in the last 72 hours BMET Recent Labs     07/15/13  0414  07/15/13  1800  07/16/13  0442  NA  144  143  145  K  3.6  3.7  2.9*  CL  100  97  101  CO2  39*  40*  35*  BUN  14  13  11   CREATININE  0.92  0.91  0.84  GLUCOSE  92  115*  94   Electrolytes Recent Labs     07/14/13  0445   07/15/13  0414  07/15/13  1800  07/16/13  0442  CALCIUM  8.5   < >  7.8*  7.7*  8.0*  MG  1.7   --   1.7   --   1.6  PHOS  1.9*   --   4.6   --   1.9*   < > = values in this interval not displayed.   Sepsis Markers No results found for this basename: LACTICACIDVEN, PROCALCITON, O2SATVEN,  in the last 72 hours ABG Recent Labs     07/15/13  1603  07/15/13  1856  07/16/13  0441  PHART  7.224*  7.282*  7.505*  PCO2ART  115.8*  94.6*  49.1*  PO2ART  80.0  81.0  52.3*   Liver Enzymes Recent Labs     07/13/13  1620  AST  34  ALT  29  ALKPHOS  130*  BILITOT  0.6  ALBUMIN  3.0*   Cardiac Enzymes Recent Labs      07/13/13  1620  PROBNP  1489.0*   Glucose Recent Labs     07/15/13  1222  07/15/13  1559  07/15/13  1958  07/15/13  2352  07/16/13  0420  07/16/13  0743  GLUCAP  123*  102*  100*  88  88  137*    Imaging Dg Chest Port 1 View  07/16/2013   *RADIOLOGY REPORT*  Clinical Data: Evaluate endotracheal tube placement.  PORTABLE CHEST - 1 VIEW  Comparison: Chest x-ray 07/15/2013.  Findings: An endotracheal tube is in place with tip 2.0 cm above the carina.  A left subclavian central venous catheter with tip terminating in the proximal to mid superior vena cava.  Lung volumes are low.  Substantially worsened bibasilar opacities may reflect areas of increasing atelectasis and/or consolidation. Small bilateral pleural effusions.  Crowding of the pulmonary vasculature with diffuse indistinctness of the interstitial markings.  Mild cardiomegaly. The patient is rotated to the left on today's exam, resulting in distortion of the mediastinal contours and reduced diagnostic sensitivity and specificity for mediastinal pathology.  Atherosclerosis in the thoracic aorta.  IMPRESSION: 1.  Support apparatus, as above. 2.  Substantially worsened aeration predominantly related to worsening bibasilar atelectasis and/or consolidation and increasing small bilateral pleural effusions. 3.  In addition, there is some cephalization of the pulmonary vasculature and indistinctness of interstitial markings such that a component of superimposed mild pulmonary edema is not excluded.   Original Report Authenticated By: Trudie Reed, M.D.   Dg Chest Port 1 View  07/15/2013   *RADIOLOGY REPORT*  Clinical Data: Post intubation.  PORTABLE CHEST - 1 VIEW  Comparison: Chest x-ray 07/15/2013.  Findings: An endotracheal tube is in place with tip 2.5 cm above the carina. There is a left-sided subclavian central venous catheter with tip terminating in the proximal to mid superior vena cava. Lung volumes are low.  There are bibasilar opacities  (right greater than left), which may reflect areas of atelectasis and/or consolidation.  Overall, aeration has improved in the left lower lobe, suggesting resolving atelectasis in this region.  No definite pleural effusions. There is cephalization of the pulmonary vasculature and slight indistinctness of the interstitial markings suggestive of mild pulmonary edema.  Mild cardiomegaly. The patient is rotated to the  left on today's exam, resulting in distortion of the mediastinal contours and reduced diagnostic sensitivity and specificity for mediastinal pathology.  Atherosclerosis in the thoracic aorta.  IMPRESSION: 1.  Support apparatus, as above.  Consider withdrawal of the endotracheal tube approximately 1-1.5 cm for more optimal placement. 2.  Improving aeration at the left base likely reflecting decreasing atelectasis in this region.  Persistent bibasilar opacities are noted (right greater than left) which may reflect areas of atelectasis and/or consolidation. 3.  Mild cardiomegaly with evidence of mild interstitial pulmonary edema, which may indicate mild congestive heart failure. 4.  Atherosclerosis.   Original Report Authenticated By: Trudie Reed, M.D.   Dg Chest Port 1 View  07/15/2013   *RADIOLOGY REPORT*  Clinical Data: Ventilator.  PORTABLE CHEST - 1 VIEW  Comparison: 07/13/2013  Findings: Support devices are unchanged.  Cardiomegaly with vascular congestion.  Bilateral perihilar and lower lobe opacities could reflect edema or pneumonia.  Suspect small left pleural effusion.  No real change since prior study.  IMPRESSION: Bilateral perihilar and lower lobe opacities, question edema versus infection.  Small left effusion.   Original Report Authenticated By: Charlett Nose, M.D.    ASSESSMENT / PLAN:  PULMONARY A: 1) Acute on chronic hypercarbic and hypoxic respiratory failure. Unclear etiology. COPD / pneumonia vs pulm edema/CHF. Bedside echo 8/3 showed a hyperdynamic LV. His IVC looked small and  collapsible. WBC is normal but has thick yellowish secretions from the ETT.  Suspect largest component here is pulm edema P:   - Mechanical ventilation   - PRVC, Vt: 8cc/kg, PEEP: 8, RR: 24, FiO2: 100% and adjust to keep O2 sat > 94% - VAP prevention order set - Albuterol / ipratropium ordered - abx as below - diuresis as tolerated, follow CXR    CARDIOVASCULAR A:  1) History of diastolic CHF. Bedside echo with hyperdynamic heart. 2) Hypotension after sedation and lasix in the ED P:  - Will hold home antihypertensives on sedation - Pressors if needed to keep MAP >65 - diuresis as his BP and renal fxn will tolerate; added lasix 40mg  q8h on 8/5  RENAL A:   1) Hypokalemia 2) hypophosphatemia P:   - replace K and phos - Will follow BMP  GASTROINTESTINAL A:   1) No issues P:   - GI prophylaxis with protonix - start TF 8/6  HEMATOLOGIC A:   1) No issues P:  - Follow CBC  INFECTIOUS A:   1) Possible CAP, moraxella   2) Penicillin allergy (unknown severity) P:   - changed imipenem to ceftriaxone, caution of PCN allergy 8/5 - d/c azithro 8/5  ENDOCRINE A:   1) DM2  P:   - Novolog sliding scale - Hold oral diabetes medications  NEUROLOGIC A:   1) Intubated, sedated. Non focal at admission. P:   - Continuous sedation with precedex  TODAY'S SUMMARY:   I have personally obtained a history, examined the patient, evaluated laboratory and imaging results, formulated the assessment and plan and placed orders. CRITICAL CARE: Critical Care Time devoted to patient care services described in this note is 40 minutes.   Levy Pupa, MD, PhD 07/16/2013, 11:28 AM [place] [facility] [phone] or if no answer [phone]

## 2013-07-16 NOTE — Progress Notes (Signed)
eLink Physician-Brief Progress Note Patient Name: Bryce [name] DOB: Nov 27, 1946 MRN: [id number]  Date of Service  07/16/2013   HPI/Events of Note  Patient with severe agitation - 2 doses of haldol 10 mg IV without impact - on fentanyl/versed but hypotensive.     eICU Interventions  Plan: Restraints for patient safety Change to precedex  Continue fentanyl D/C versed DA gtt for BP support as needed   Intervention Category Major Interventions: Delirium, psychosis, severe agitation - evaluation and management  DETERDING,ELIZABETH 07/16/2013, 12:43 AM

## 2013-07-17 LAB — CBC WITH DIFFERENTIAL/PLATELET
Basophils Relative: 0 % (ref 0–1)
HCT: 42.6 % (ref 39.0–52.0)
Hemoglobin: 12.8 g/dL — ABNORMAL LOW (ref 13.0–17.0)
Lymphocytes Relative: 19 % (ref 12–46)
Lymphs Abs: 1.4 10*3/uL (ref 0.7–4.0)
MCHC: 30 g/dL (ref 30.0–36.0)
Monocytes Absolute: 0.8 10*3/uL (ref 0.1–1.0)
Monocytes Relative: 11 % (ref 3–12)
Neutro Abs: 4.6 10*3/uL (ref 1.7–7.7)
RBC: 4.29 MIL/uL (ref 4.22–5.81)

## 2013-07-17 LAB — BASIC METABOLIC PANEL
BUN: 16 mg/dL (ref 6–23)
Chloride: 102 mEq/L (ref 96–112)
GFR calc Af Amer: >90 mL/min (ref >90)
Glucose, Bld: 111 mg/dL — ABNORMAL HIGH (ref 70–99)
Potassium: 3.4 mEq/L — ABNORMAL LOW (ref 3.5–5.1)
Sodium: 143 mEq/L (ref 135–145)

## 2013-07-17 LAB — GLUCOSE, CAPILLARY
Glucose-Capillary: 122 mg/dL — ABNORMAL HIGH (ref 70–99)
Glucose-Capillary: 98 mg/dL (ref 70–99)

## 2013-07-17 MED ORDER — ALBUTEROL SULFATE (5 MG/ML) 0.5% IN NEBU
2.5000 mg | INHALATION_SOLUTION | Freq: Four times a day (QID) | RESPIRATORY_TRACT | Status: DC
  Administered 2013-07-17 – 2013-07-23 (×23): 2.5 mg via RESPIRATORY_TRACT
  Filled 2013-07-17 (×26): qty 0.5

## 2013-07-17 MED ORDER — POTASSIUM CHLORIDE 10 MEQ/50ML IV SOLN
10.0000 meq | INTRAVENOUS | Status: AC
  Administered 2013-07-17 (×2): 10 meq via INTRAVENOUS
  Filled 2013-07-17: qty 100

## 2013-07-17 MED ORDER — IPRATROPIUM BROMIDE 0.02 % IN SOLN
0.5000 mg | Freq: Four times a day (QID) | RESPIRATORY_TRACT | Status: DC
  Administered 2013-07-17 – 2013-07-23 (×23): 0.5 mg via RESPIRATORY_TRACT
  Filled 2013-07-17 (×26): qty 2.5

## 2013-07-17 MED ORDER — PANTOPRAZOLE SODIUM 40 MG PO TBEC: 40.0000 mg | DELAYED_RELEASE_TABLET | Freq: Every day | ORAL | Status: DC

## 2013-07-17 NOTE — Progress Notes (Signed)
Patient instructed on the use of the Incentive Spirometry. Patient achieved x 5.

## 2013-07-17 NOTE — Procedures (Signed)
Extubation Procedure Note  Patient Details:   Name: Bryce [name] DOB: October 26, 1946 MRN: [id number]   Airway Documentation:     Evaluation  O2 sats: stable throughout Complications: No apparent complications Patient did tolerate procedure well. Bilateral Breath Sounds: Diminished Suctioning: Airway Yes Patient tolerated wean. Extubated to 5 Lpm nasal cannula. No signs of stridor or dyspnea. Patient resting comfortably.  Ancil Boozer 07/17/2013, 1:55 PM

## 2013-07-17 NOTE — Progress Notes (Deleted)
[facility] ADULT ICU REPLACEMENT PROTOCOL FOR AM LAB REPLACEMENT ONLY  The patient does not apply for the [facility] Adult ICU Electrolyte Replacment Protocol based on the criteria listed below:    2. Is urine output >/= 0.5 ml/kg/hr for the last 6 hours? no Patient's UOP is 0.2 ml/kg/hr  Bryce [name] P 07/17/2013 6:04 AM

## 2013-07-17 NOTE — Progress Notes (Signed)
UR Completed.  Delise Simenson Jane [phone] 07/17/2013  

## 2013-07-17 NOTE — Progress Notes (Signed)
[facility] ADULT ICU REPLACEMENT PROTOCOL FOR AM LAB REPLACEMENT ONLY  The patient does apply for the [facility] Adult ICU Electrolyte Replacment Protocol based on the criteria listed below:   1. Is GFR >/= 40 ml/min? yes  Patient's GFR today is 84 2. Is urine output >/= 0.5 ml/kg/hr for the last 6 hours? yes Patient's UOP is 0.7 ml/kg/hr 3. Is BUN < 60 mg/dL? yes  Patient's BUN today is 16 4. Abnormal electrolyte(s):Potassium 5. Ordered repletion with: Potassium per Protocol 6. If a panic level lab has been reported, has the CCM MD in charge been notified? no.     Orland Dec P 07/17/2013 6:45 AM

## 2013-07-17 NOTE — Progress Notes (Signed)
PULMONARY  / CRITICAL CARE MEDICINE  Name: Bryce [name] MRN: [id number] DOB: [birth year]    ADMISSION DATE:  07/13/2013   PRIMARY SERVICE: PCCM  CHIEF COMPLAINT:  SOB, hypercarbic respiratory failure.  BRIEF PATIENT DESCRIPTION:  67 years old male with PMH relevant for DM, HTN, diastolic CHF and obesity. Presents with progressive SOB. He was found to be in hypercarbic respiratory failure. He was placed on BiPAP but failed and required intubation and mechanical ventilation.    SIGNIFICANT EVENTS / STUDIES:  TTE 8/4 >> LVEF 55-60%, hypertrophy, mod dilated RV  LINES / TUBES: - Left subclavian CVC 07/13/13 >>  - ETT 07/13/13  >> 8/5 (self extub) - ETT 8/5 >>   CULTURES: Blood cultures 8/3 >>  Urine cultures 8/3 >> negative Sputum cultures 8/4 >> moraxella (b-lactamase +)  ANTIBIOTICS: Imipenem 07/13/13 >> 8/5 Vancomycin 07/13/13 >> 8/4 azithro 8/4 >> 8/5 Ceftriaxone 8/5 >>   SUBJECTIVE:  Remains on 0.50 + PEEP 8 More awake, less agitated  VITAL SIGNS: Temp:  [98.4 F (36.9 C)-100 F (37.8 C)] 100 F (37.8 C) (08/07 0932) Pulse Rate:  [37-73] 71 (08/07 1200) Resp:  [13-23] 20 (08/07 1200) BP: (87-151)/(49-102) 151/102 mmHg (08/07 1200) SpO2:  [57 %-100 %] 93 % (08/07 1200) FiO2 (%):  [50 %-60 %] 50 % (08/07 1200) Weight:  [111.1 kg (244 lb 14.9 oz)] 111.1 kg (244 lb 14.9 oz) (08/07 0300) HEMODYNAMICS:   VENTILATOR SETTINGS: Vent Mode:  [-] PRVC FiO2 (%):  [50 %-60 %] 50 % Set Rate:  [12 bmp] 12 bmp Vt Set:  [500 mL] 500 mL PEEP:  [5 cmH20-8 cmH20] 5 cmH20 Plateau Pressure:  [19 cmH20-27 cmH20] 22 cmH20 INTAKE / OUTPUT: Intake/Output     08/06 0701 - 08/07 0700 08/07 0701 - 08/08 0700   I.V. (mL/kg) 1443.9 (13) 308.8 (2.8)   NG/GT 889 100   IV Piggyback 50 50   Total Intake(mL/kg) 2382.9 (21.4) 458.8 (4.1)   Urine (mL/kg/hr) 1885 (0.7) 750 (1.3)   Total Output 1885 750   Net +497.9 -291.2          PHYSICAL EXAMINATION: General: Intubated, no apparent  distress. Eyes: Anicteric sclerae. ENT: ETT in place. Trachea at midline.  Lymph: No cervical, supraclavicular, or axillary lymphadenopathy. Heart: Normal S1, S2. No murmurs, rubs, or gallops appreciated. No bruits, equal pulses. Lungs: Bilateral crackles.  Abdomen: Abdomen soft, non-tender and not distended, normoactive bowel sounds. No hepatosplenomegaly or masses. Musculoskeletal: No clubbing or synovitis. Bilateral LE edema with chronic skin changes likely related to venous insufficiency. Skin: Chronic skin changes to both LE's  likely related to venous insufficiency  Neuro: awake and nodding appropriately to questions.   LABS:  CBC Recent Labs     07/15/13  0414  07/16/13  0442  07/17/13  0500  WBC  4.9  6.9  7.1  HGB  13.0  13.1  12.8*  HCT  43.8  43.4  42.6  PLT  219  210  230   Coag's No results found for this basename: APTT, INR,  in the last 72 hours BMET Recent Labs     07/15/13  1800  07/16/13  0442  07/17/13  0500  NA  143  145  143  K  3.7  2.9*  3.4*  CL  97  101  102  CO2  40*  35*  36*  BUN  13  11  16   CREATININE  0.91  0.84  0.99  GLUCOSE  115*  94  111*   Electrolytes Recent Labs     07/15/13  0414  07/15/13  1800  07/16/13  0442  07/17/13  0500  CALCIUM  7.8*  7.7*  8.0*  8.7  MG  1.7   --   1.6   --   PHOS  4.6   --   1.9*   --    Sepsis Markers No results found for this basename: LACTICACIDVEN, PROCALCITON, O2SATVEN,  in the last 72 hours ABG Recent Labs     07/15/13  1856  07/16/13  0441  07/16/13  1019  PHART  7.282*  7.505*  7.482*  PCO2ART  94.6*  49.1*  50.3*  PO2ART  81.0  52.3*  37.0*   Liver Enzymes No results found for this basename: AST, ALT, ALKPHOS, BILITOT, ALBUMIN,  in the last 72 hours Cardiac Enzymes No results found for this basename: TROPONINI, PROBNP,  in the last 72 hours Glucose Recent Labs     07/16/13  1607  07/16/13  2004  07/16/13  2359  07/17/13  0415  07/17/13  0753  07/17/13  1150  GLUCAP   124*  106*  122*  116*  133*  116*    Imaging Dg Chest Port 1 View  07/16/2013   *RADIOLOGY REPORT*  Clinical Data: Evaluate endotracheal tube placement.  PORTABLE CHEST - 1 VIEW  Comparison: Chest x-ray 07/15/2013.  Findings: An endotracheal tube is in place with tip 2.0 cm above the carina.  A left subclavian central venous catheter with tip terminating in the proximal to mid superior vena cava.  Lung volumes are low.  Substantially worsened bibasilar opacities may reflect areas of increasing atelectasis and/or consolidation. Small bilateral pleural effusions.  Crowding of the pulmonary vasculature with diffuse indistinctness of the interstitial markings.  Mild cardiomegaly. The patient is rotated to the left on today's exam, resulting in distortion of the mediastinal contours and reduced diagnostic sensitivity and specificity for mediastinal pathology.  Atherosclerosis in the thoracic aorta.  IMPRESSION: 1.  Support apparatus, as above. 2.  Substantially worsened aeration predominantly related to worsening bibasilar atelectasis and/or consolidation and increasing small bilateral pleural effusions. 3.  In addition, there is some cephalization of the pulmonary vasculature and indistinctness of interstitial markings such that a component of superimposed mild pulmonary edema is not excluded.   Original Report Authenticated By: Trudie Reed, M.D.   Dg Chest Port 1 View  07/15/2013   *RADIOLOGY REPORT*  Clinical Data: Post intubation.  PORTABLE CHEST - 1 VIEW  Comparison: Chest x-ray 07/15/2013.  Findings: An endotracheal tube is in place with tip 2.5 cm above the carina. There is a left-sided subclavian central venous catheter with tip terminating in the proximal to mid superior vena cava. Lung volumes are low.  There are bibasilar opacities (right greater than left), which may reflect areas of atelectasis and/or consolidation.  Overall, aeration has improved in the left lower lobe, suggesting resolving  atelectasis in this region.  No definite pleural effusions. There is cephalization of the pulmonary vasculature and slight indistinctness of the interstitial markings suggestive of mild pulmonary edema.  Mild cardiomegaly. The patient is rotated to the left on today's exam, resulting in distortion of the mediastinal contours and reduced diagnostic sensitivity and specificity for mediastinal pathology.  Atherosclerosis in the thoracic aorta.  IMPRESSION: 1.  Support apparatus, as above.  Consider withdrawal of the endotracheal tube approximately 1-1.5 cm for more optimal placement. 2.  Improving aeration at the left base likely  reflecting decreasing atelectasis in this region.  Persistent bibasilar opacities are noted (right greater than left) which may reflect areas of atelectasis and/or consolidation. 3.  Mild cardiomegaly with evidence of mild interstitial pulmonary edema, which may indicate mild congestive heart failure. 4.  Atherosclerosis.   Original Report Authenticated By: Trudie Reed, M.D.   Dg Abd Portable 1v  07/16/2013   *RADIOLOGY REPORT*  Clinical Data: Orogastric tube placement  PORTABLE ABDOMEN - 1 VIEW  Comparison: Chest x-ray same day  Findings: Cardiomegaly again noted.  Endotracheal tube with tip at the level of the carina.  Mild basilar atelectasis.  Endotracheal tube should be retracted about 0.5 cm.  NG tube in place with tip in proximal stomach.  No diagnostic pneumothorax. No pulmonary edema or segmental infiltrate.  IMPRESSION:  Endotracheal tube with tip at the level of the carina.  Mild basilar atelectasis.  Endotracheal tube should be retracted about 0.5 cm.  NG tube in place with tip in proximal stomach.  No diagnostic pneumothorax. No pulmonary edema or segmental infiltrate.   Original Report Authenticated By: Natasha Mead, M.D.    ASSESSMENT / PLAN:  PULMONARY A: 1) Acute on chronic hypercarbic and hypoxic respiratory failure. Unclear etiology. COPD / pneumonia vs pulm  edema/CHF. Bedside echo 8/3 showed a hyperdynamic LV. His IVC looked small and collapsible. WBC is normal but has thick yellowish secretions from the ETT.  Suspect largest component here is pulm edema P:   - Mechanical ventilation   - PRVC, Vt: 8cc/kg, PEEP: 8, RR: 24, FiO2: 100% and adjust to keep O2 sat > 94% - VAP prevention order set - Albuterol / ipratropium ordered - abx as below - diuresis as tolerated, follow CXR    CARDIOVASCULAR A:  1) History of diastolic CHF. Bedside echo with hyperdynamic heart. 2) Hypotension after sedation and lasix in the ED, resolved P:  - Will hold home antihypertensives on sedation - Pressors if needed to keep MAP >65 - diuresis as his BP and renal fxn will tolerate; added lasix 40mg  q8h on 8/5; overall net negative -3700cc  RENAL A:   1) Hypokalemia 2) hypophosphatemia P:   - replace K and phos - Follow BMP  GASTROINTESTINAL A:   1) No issues P:   - GI prophylaxis with protonix - started TF 8/6  HEMATOLOGIC A:   1) No issues P:  - Follow CBC  INFECTIOUS A:   1) Possible CAP, moraxella   2) Penicillin allergy (unknown severity) P:   - changed imipenem to ceftriaxone, caution of PCN allergy 8/5 - d/c azithro 8/5  ENDOCRINE A:   1) DM2  P:   - Novolog sliding scale - Hold oral diabetes medications  NEUROLOGIC A:   1) Intubated, sedated. Non focal at admission. P:   - Continuous sedation with precedex  TODAY'S SUMMARY:   I have personally obtained a history, examined the patient, evaluated laboratory and imaging results, formulated the assessment and plan and placed orders. CRITICAL CARE: Critical Care Time devoted to patient care services described in this note is 40 minutes.   Levy Pupa, MD, PhD 07/17/2013, 12:25 PM [facility] [phone] or if no answer [phone]

## 2013-07-18 ENCOUNTER — Inpatient Hospital Stay (HOSPITAL_COMMUNITY): Payer: Medicare Other

## 2013-07-18 LAB — GLUCOSE, CAPILLARY
Glucose-Capillary: 109 mg/dL — ABNORMAL HIGH (ref 70–99)
Glucose-Capillary: 128 mg/dL — ABNORMAL HIGH (ref 70–99)
Glucose-Capillary: 132 mg/dL — ABNORMAL HIGH (ref 70–99)
Glucose-Capillary: 86 mg/dL (ref 70–99)

## 2013-07-18 LAB — BASIC METABOLIC PANEL
BUN: 13 mg/dL (ref 6–23)
CO2: 36 mEq/L — ABNORMAL HIGH (ref 19–32)
Chloride: 100 mEq/L (ref 96–112)
Creatinine, Ser: 0.8 mg/dL (ref 0.50–1.35)
Glucose, Bld: 100 mg/dL — ABNORMAL HIGH (ref 70–99)
Potassium: 3.3 mEq/L — ABNORMAL LOW (ref 3.5–5.1)

## 2013-07-18 LAB — CBC
HCT: 45.6 % (ref 39.0–52.0)
Hemoglobin: 13.3 g/dL (ref 13.0–17.0)
MCH: 29.4 pg (ref 26.0–34.0)
MCHC: 29.2 g/dL — ABNORMAL LOW (ref 30.0–36.0)
MCV: 100.7 fL — ABNORMAL HIGH (ref 78.0–100.0)
RDW: 16.7 % — ABNORMAL HIGH (ref 11.5–15.5)

## 2013-07-18 MED ORDER — FUROSEMIDE 40 MG PO TABS
40.0000 mg | ORAL_TABLET | Freq: Two times a day (BID) | ORAL | Status: DC
  Administered 2013-07-18 – 2013-07-23 (×11): 40 mg via ORAL
  Filled 2013-07-18 (×14): qty 1

## 2013-07-18 MED ORDER — POTASSIUM CHLORIDE 20 MEQ/15ML (10%) PO LIQD
40.0000 meq | Freq: Three times a day (TID) | ORAL | Status: DC
  Filled 2013-07-18 (×3): qty 30

## 2013-07-18 MED ORDER — INSULIN ASPART 100 UNIT/ML ~~LOC~~ SOLN
0.0000 [IU] | Freq: Every day | SUBCUTANEOUS | Status: DC
  Administered 2013-07-20: 2 [IU] via SUBCUTANEOUS

## 2013-07-18 MED ORDER — INSULIN ASPART 100 UNIT/ML ~~LOC~~ SOLN
0.0000 [IU] | Freq: Three times a day (TID) | SUBCUTANEOUS | Status: DC
  Administered 2013-07-19 – 2013-07-21 (×5): 3 [IU] via SUBCUTANEOUS
  Administered 2013-07-22: 4 [IU] via SUBCUTANEOUS
  Administered 2013-07-23: 3 [IU] via SUBCUTANEOUS

## 2013-07-18 MED ORDER — ONDANSETRON HCL 4 MG/2ML IJ SOLN: 4.0000 mg | Freq: Four times a day (QID) | INTRAMUSCULAR | Status: DC | PRN

## 2013-07-18 MED ORDER — GLUCERNA SHAKE PO LIQD
237.0000 mL | Freq: Two times a day (BID) | ORAL | Status: DC
  Administered 2013-07-18 – 2013-07-20 (×4): 237 mL via ORAL
  Filled 2013-07-18: qty 237

## 2013-07-18 MED ORDER — PANTOPRAZOLE SODIUM 40 MG IV SOLR: 40.0000 mg | Freq: Every day | INTRAVENOUS | Status: DC

## 2013-07-18 MED ORDER — POTASSIUM CHLORIDE CRYS ER 20 MEQ PO TBCR
40.0000 meq | EXTENDED_RELEASE_TABLET | Freq: Three times a day (TID) | ORAL | Status: AC
  Administered 2013-07-18 (×3): 40 meq via ORAL
  Filled 2013-07-18: qty 1
  Filled 2013-07-18 (×2): qty 2
  Filled 2013-07-18: qty 1

## 2013-07-18 MED ORDER — CLONIDINE HCL 0.2 MG PO TABS
0.2000 mg | ORAL_TABLET | Freq: Two times a day (BID) | ORAL | Status: DC
  Administered 2013-07-18 – 2013-07-23 (×10): 0.2 mg via ORAL
  Filled 2013-07-18 (×12): qty 1

## 2013-07-18 MED ORDER — POTASSIUM CHLORIDE 20 MEQ/15ML (10%) PO LIQD: 40.0000 meq | Freq: Once | ORAL | Status: DC

## 2013-07-18 MED ORDER — AMLODIPINE BESYLATE 10 MG PO TABS
10.0000 mg | ORAL_TABLET | Freq: Every day | ORAL | Status: DC
  Administered 2013-07-18 – 2013-07-23 (×5): 10 mg via ORAL
  Filled 2013-07-18 (×6): qty 1

## 2013-07-18 NOTE — Progress Notes (Signed)
PULMONARY  / CRITICAL CARE MEDICINE  Name: Bryce [name] MRN: [id number] DOB: 28-Apr-1946    ADMISSION DATE:  07/13/2013   PRIMARY SERVICE: PCCM  CHIEF COMPLAINT:  SOB, hypercarbic respiratory failure.  BRIEF PATIENT DESCRIPTION:  67 years old male with PMH relevant for DM, HTN, diastolic CHF and obesity. Presents with progressive SOB. He was found to be in hypercarbic respiratory failure. He was placed on BiPAP but failed and required intubation and mechanical ventilation.    SIGNIFICANT EVENTS / STUDIES:  TTE 8/4 >> LVEF 55-60%, hypertrophy, mod dilated RV  LINES / TUBES: - Left subclavian CVC 07/13/13 >>  - ETT 07/13/13  >> 8/5 (self extub) - ETT 8/5 >>   CULTURES: Blood cultures 8/3 >>  Urine cultures 8/3 >> negative Sputum cultures 8/4 >> moraxella (b-lactamase +)  ANTIBIOTICS: Imipenem 07/13/13 >> 8/5 Vancomycin 07/13/13 >> 8/4 azithro 8/4 >> 8/5 Ceftriaxone 8/5 >>   SUBJECTIVE:  Awake and interacting Wants to go home Able to take some sips water  VITAL SIGNS: Temp:  [98 F (36.7 C)-98.8 F (37.1 C)] 98.6 F (37 C) (08/08 0812) Pulse Rate:  [37-103] 95 (08/08 0900) Resp:  [14-23] 20 (08/08 0900) BP: (123-166)/(67-114) 146/91 mmHg (08/08 0900) SpO2:  [57 %-100 %] 94 % (08/08 0900) FiO2 (%):  [50 %] 50 % (08/07 1300) Weight:  [106.5 kg (234 lb 12.6 oz)] 106.5 kg (234 lb 12.6 oz) (08/08 0500) HEMODYNAMICS:   VENTILATOR SETTINGS: Vent Mode:  [-] PSV;CPAP FiO2 (%):  [50 %] 50 % Set Rate:  [12 bmp] 12 bmp Vt Set:  [500 mL] 500 mL PEEP:  [5 cmH20] 5 cmH20 Pressure Support:  [5 cmH20] 5 cmH20 Plateau Pressure:  [22 cmH20] 22 cmH20 INTAKE / OUTPUT: Intake/Output     08/07 0701 - 08/08 0700 08/08 0701 - 08/09 0700   I.V. (mL/kg) 1272.8 (12)    NG/GT 120    IV Piggyback 50    Total Intake(mL/kg) 1442.8 (13.5)    Urine (mL/kg/hr) 3725 (1.5) 550 (2)   Total Output 3725 550   Net -2282.2 -550        Stool Occurrence 1 x      PHYSICAL EXAMINATION: General:  Intubated, no apparent distress. Eyes: Anicteric sclerae. ENT: ETT in place. Trachea at midline.  Lymph: No cervical, supraclavicular, or axillary lymphadenopathy. Heart: Normal S1, S2. No murmurs, rubs, or gallops appreciated. No bruits, equal pulses. Lungs: Bilateral crackles.  Abdomen: Abdomen soft, non-tender and not distended, normoactive bowel sounds. No hepatosplenomegaly or masses. Musculoskeletal: No clubbing or synovitis. Bilateral LE edema with chronic skin changes likely related to venous insufficiency. Skin: Chronic skin changes to both LE's  likely related to venous insufficiency  Neuro: awake and nodding appropriately to questions.   LABS:  CBC Recent Labs     07/16/13  0442  07/17/13  0500  07/18/13  0500  WBC  6.9  7.1  7.3  HGB  13.1  12.8*  13.3  HCT  43.4  42.6  45.6  PLT  210  230  230   Coag's No results found for this basename: APTT, INR,  in the last 72 hours BMET Recent Labs     07/16/13  0442  07/17/13  0500  07/18/13  0500  NA  145  143  145  K  2.9*  3.4*  3.3*  CL  101  102  100  CO2  35*  36*  36*  BUN  11  16  13  CREATININE  0.84  0.99  0.80  GLUCOSE  94  111*  100*   Electrolytes Recent Labs     07/16/13  0442  07/17/13  0500  07/18/13  0500  CALCIUM  8.0*  8.7  9.0  MG  1.6   --    --   PHOS  1.9*   --    --    Sepsis Markers No results found for this basename: LACTICACIDVEN, PROCALCITON, O2SATVEN,  in the last 72 hours ABG Recent Labs     07/15/13  1856  07/16/13  0441  07/16/13  1019  PHART  7.282*  7.505*  7.482*  PCO2ART  94.6*  49.1*  50.3*  PO2ART  81.0  52.3*  37.0*   Liver Enzymes No results found for this basename: AST, ALT, ALKPHOS, BILITOT, ALBUMIN,  in the last 72 hours Cardiac Enzymes No results found for this basename: TROPONINI, PROBNP,  in the last 72 hours Glucose Recent Labs     07/17/13  1150  07/17/13  1614  07/17/13  2009  07/17/13  2354  07/18/13  0427  07/18/13  0755  GLUCAP  116*  107*   98  86  85  109*    Imaging Dg Chest Port 1 View  07/18/2013   *RADIOLOGY REPORT*  Clinical Data: Follow up basilar atelectasis and/or pneumonia and vascular congestion.  PORTABLE CHEST - 1 VIEW  Comparison: Portable chest x-rays 07/16/2013 dating back to 07/13/2013.  Findings: Left subclavian central venous catheter tip projects over the upper SVC.  Cardiac silhouette enlarged.  Pulmonary venous hypertension without overt edema, slightly improved since the examination 2 days ago.  Stable dense consolidation in the left lower lobe and confluent airspace opacities at the right lung base. No new pulmonary parenchymal abnormalities.  IMPRESSION: Improved pulmonary venous hypertension/pulmonary edema since 2 days ago.  Stable bilateral lower lobe atelectasis and/or pneumonia, left greater than right.  No new abnormalities.   Original Report Authenticated By: Hulan Saas, M.D.   Dg Abd Portable 1v  07/16/2013   *RADIOLOGY REPORT*  Clinical Data: Orogastric tube placement  PORTABLE ABDOMEN - 1 VIEW  Comparison: Chest x-ray same day  Findings: Cardiomegaly again noted.  Endotracheal tube with tip at the level of the carina.  Mild basilar atelectasis.  Endotracheal tube should be retracted about 0.5 cm.  NG tube in place with tip in proximal stomach.  No diagnostic pneumothorax. No pulmonary edema or segmental infiltrate.  IMPRESSION:  Endotracheal tube with tip at the level of the carina.  Mild basilar atelectasis.  Endotracheal tube should be retracted about 0.5 cm.  NG tube in place with tip in proximal stomach.  No diagnostic pneumothorax. No pulmonary edema or segmental infiltrate.   Original Report Authenticated By: Natasha Mead, M.D.    ASSESSMENT / PLAN:  PULMONARY A: 1) Acute on chronic hypercarbic and hypoxic respiratory failure. Unclear etiology. COPD / pneumonia vs pulm edema/CHF. Bedside echo 8/3 showed a hyperdynamic LV. Suspect largest component here is pulm edema P:   - Albuterol /  ipratropium ordered - abx as below - diuresis as tolerated, follow CXR   CARDIOVASCULAR A:  1) History of diastolic CHF. Bedside echo hyperdynamic 2) Hypotension after sedation and lasix in the ED, resolved P:  - restart home antihypertensives > norvasc 10 + clonidine 0.2 - diuresis as his BP and renal fxn will tolerate; added lasix 40mg  IV q8h on 8/5; decrease on 8/8 to 40 PO bid (double his home dose)  RENAL A:   1) Hypokalemia 2) hypophosphatemia P:   - replace K and phos - Follow BMP  GASTROINTESTINAL A:   1) No issues P:   - GI prophylaxis with protonix - swallow eval and PO diet  HEMATOLOGIC A:   1) No issues P:  - Follow CBC  INFECTIOUS A:   1) Possible CAP, moraxella   2) Penicillin allergy (unknown severity) P:   - day 6/8 abx on 8/8 - changed imipenem to ceftriaxone, caution of PCN allergy 8/5 - d/c azithro 8/5  ENDOCRINE A:   1) DM2  P:   - Novolog sliding scale - Hold oral diabetes medications  NEUROLOGIC A:   1) mild confusion, improving P:   - follow   TODAY'S SUMMARY:   I have personally obtained a history, examined the patient, evaluated laboratory and imaging results, formulated the assessment and plan and placed orders.    Levy Pupa, MD, PhD 07/18/2013, 9:36 AM [facility] [phone] or if no answer [phone]

## 2013-07-18 NOTE — Progress Notes (Deleted)
PT Cancellation Note  Patient Details Name: Bryce [name] MRN: [id number] DOB: Oct 16, 1946   Cancelled Treatment:    Reason Eval/Treat Not Completed: Patient not medically ready (nsg asked to hold PT today)   Fabio Asa 07/18/2013, 1:07 PM
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
## 2013-07-18 NOTE — Evaluation (Signed)
Physical Therapy Evaluation Patient Details Name: Bryce [name] MRN: [id number] DOB: September 15, 1946 Today's Date: 07/18/2013 Time: 1201-1228 PT Time Calculation (min): 27 min  PT Assessment / Plan / Recommendation History of Present Illness    67 years old male with PMH relevant for DM, HTN, diastolic CHF and obesity. Presents with progressive SOB. He was found to be in hypercarbic respiratory failure. He was placed on BiPAP but failed and required intubation and mechanical ventilation. Now on [place].   Clinical Impression  Patient evaluation performed, strength assessed with limited strength (RLE weaker than LLE). Evaluation limited to bed mobility secondary to pt having liquid stool BM during session. Patient ws cleaned up, linens changed, new bandages applied to sacral wounds. Patient O2 did begin to decrease with mobility, increased supplemental O2. Nsg in room. Will follow up for further evaluation and more information about baseline mobility as it was difficult to understand patient secondary to poor vocalization. Will see as indicated with a trial of PT if patient appropriate.      PT Assessment  Patient needs continued PT services    Follow Up Recommendations  SNF       Barriers to Discharge Decreased caregiver support      Equipment Recommendations  None recommended by PT    Recommendations for Other Services OT consult   Frequency Min 2X/week    Precautions / Restrictions     Pertinent Vitals/Pain No pain reported at this time      Mobility  Bed Mobility Bed Mobility: Rolling Right;Rolling Left;Scooting to [facility] Rolling Right: 3: Mod assist Rolling Left: 3: Mod assist Scooting to HOB: 1: +2 Total assist Scooting to [facility]: Patient Percentage: 40% Details for Bed Mobility Assistance: Patient able to initiate roll but unable to bring legs fully to the side Transfers Transfers: Not assessed Ambulation/Gait Ambulation/Gait Assistance: Not tested (comment)    Exercises  SLR  x 5 bilaterally (required AAROM to reach range) Ankle pumps x 10 bilaterally   PT Diagnosis: Difficulty walking;Abnormality of gait;Generalized weakness;Acute pain  PT Problem List: Decreased strength;Decreased range of motion;Decreased activity tolerance;Decreased balance;Decreased mobility;Decreased coordination PT Treatment Interventions: DME instruction;Gait training;Stair training;Functional mobility training;Therapeutic activities;Therapeutic exercise;Balance training;Patient/family education;Wheelchair mobility training     PT Goals(Current goals can be found in the care plan section) Acute Rehab PT Goals Patient Stated Goal: none stated PT Goal Formulation: With patient Time For Goal Achievement: 08/01/13 Potential to Achieve Goals: Fair  Visit Information  Last PT Received On: 07/18/13       Prior Functioning  Home Living Family/patient expects to be discharged to:: Private residence Living Arrangements: Alone Type of Home: Apartment Home Access: Level entry Home Layout: One level Home Equipment: Environmental consultant - 2 wheels;Wheelchair - power Prior Function Level of Independence: Independent with assistive device(s) Communication Communication: Other (comment) (difficult to hear patient at times)    Cognition  Cognition Arousal/Alertness: Awake/alert Behavior During Therapy: WFL for tasks assessed/performed Overall Cognitive Status: No family/caregiver present to determine baseline cognitive functioning    Extremity/Trunk Assessment Upper Extremity Assessment Upper Extremity Assessment: Generalized weakness Lower Extremity Assessment Lower Extremity Assessment: Generalized weakness;RLE deficits/detail;LLE deficits/detail RLE Deficits / Details: increased weakness, limited ROM RLE Sensation: history of peripheral neuropathy LLE Deficits / Details: limited ROM LLE Sensation: history of peripheral neuropathy   Balance  no assessed  End of Session PT - End of  Session Equipment Utilized During Treatment: Oxygen Activity Tolerance: Patient limited by fatigue;Other (comment) (Pt saturated in liquid BM during mobility exam) Patient left: in  bed;with call bell/phone within reach;with nursing/sitter in room Nurse Communication: Need for lift equipment  GP     Fabio Asa 07/18/2013, 2:01 PM Charlotte Crumb, PT DPT  [phone]

## 2013-07-18 NOTE — Progress Notes (Signed)
Patient placed on BIPAP per home use and he states that he can not tolerate at this time and would try to get his from home.

## 2013-07-18 NOTE — Progress Notes (Signed)
eLink Physician-Brief Progress Note Patient Name: Bryce [name] DOB: 1946/11/13 MRN: [id number]  Date of Service  07/18/2013   HPI/Events of Note     eICU Interventions  Home bipap q hs   Intervention Category Minor Interventions: Routine modifications to care plan (e.g. PRN medications for pain, fever)  ALVA,RAKESH V. 07/18/2013, 9:42 PM

## 2013-07-18 NOTE — Progress Notes (Signed)
NUTRITION FOLLOW UP  Intervention:    1. Glucerna Shake po BID, each supplement provides 220 kcal and 10 grams of protein. (will adjust based on pt's PO intake)   Nutrition Dx:   Inadequate oral intake related to inability to eat as evidenced by NPO status; progressing.    Goal:  Enteral nutrition to provide 60-70% of estimated calorie needs (22-25 kcals/kg ideal body weight) and 100% of estimated protein needs, based on ASPEN guidelines for permissive underfeeding in critically ill obese individuals, NA  New Goal: Pt to meet >/= 90% of their estimated nutrition needs   Monitor:  PO intake, weigh trend, plan of care, wounds  Assessment:   Pt admitted with acute on chronic hypercarbic respiratory failure with unclear etiology (COPD/PNA vs pulmonary edema/CHF). Pt discussed during ICU rounds and with RN. Pt with low potassium which is being replaced.  Per pt he had a good appetite PTA without problems chewing or swallowing. Reports currently does not have an appetite.   Height: Ht Readings from Last 1 Encounters:  07/13/13 5' 9.02" (1.753 m)    Weight Status:   Wt Readings from Last 1 Encounters:  07/18/13 234 lb 12.6 oz (106.5 kg)  Admission weight 275 lb; pt on lasix  Re-estimated needs:  Kcal: 2100-2300 Protein: 95-115 grams  Fluid: > 2 L/day  Skin: MASD (moisture associated skin damage) with sheer injury; lymphedema with brawny LE edema; per WOC note 8/4  Diet Order: Dysphagia 2 with Thin Liquids   Intake/Output Summary (Last 24 hours) at 07/18/13 1103 Last data filed at 07/18/13 1000  Gross per 24 hour  Intake   1218 ml  Output   3525 ml  Net  -2307 ml    Last BM: 8/8   Labs:   Recent Labs Lab 07/14/13 0445  07/15/13 0414  07/16/13 0442 07/17/13 0500 07/18/13 0500  NA 144  < > 144  < > 145 143 145  K 2.9*  < > 3.6  < > 2.9* 3.4* 3.3*  CL 97  < > 100  < > 101 102 100  CO2 39*  < > 39*  < > 35* 36* 36*  BUN 15  < > 14  < > 11 16 13   CREATININE  0.84  < > 0.92  < > 0.84 0.99 0.80  CALCIUM 8.5  < > 7.8*  < > 8.0* 8.7 9.0  MG 1.7  --  1.7  --  1.6  --   --   PHOS 1.9*  --  4.6  --  1.9*  --   --   GLUCOSE 99  < > 92  < > 94 111* 100*  < > = values in this interval not displayed.  CBG (last 3)   Recent Labs  07/17/13 2354 07/18/13 0427 07/18/13 0755  GLUCAP 86 85 109*    Scheduled Meds: . albuterol  2.5 mg Nebulization Q6H  . amLODipine  10 mg Oral Daily  . antiseptic oral rinse  15 mL Mouth Rinse QID  . cefTRIAXone (ROCEPHIN)  IV  1 g Intravenous Q24H  . chlorhexidine  15 mL Mouth Rinse BID  . cloNIDine  0.2 mg Oral BID  . furosemide  40 mg Oral BID  . heparin  5,000 Units Subcutaneous Q8H  . insulin aspart  2-6 Units Subcutaneous Q4H  . ipratropium  0.5 mg Nebulization Q6H  . potassium chloride  40 mEq Oral TID    Continuous Infusions: . dextrose 5 % and 0.9%  NaCl 50 mL/hr at 07/18/13 0700    Kendell Bane RD, LDN, CNSC [phone] Pager [phone] After Hours Pager

## 2013-07-18 NOTE — Progress Notes (Signed)
Chaplain responded to ED page for pt brought by EMS from home. Pt had SOB and is now on ventilator. No family present. I attempted to contact pt's family. There was no answer at his home number. I called the number we had listed for his sister and got recording saying the number is "discontinued of no longer in service."

## 2013-07-18 NOTE — Progress Notes (Signed)
Patient leaning over bed rails insisting that this RN get him out of bed and to "Just do what I say." Pulled patient back into bed with the assistance of C Forcher Rn once and [employer] RN twice. Safety sitter requested from charge RN, safety sitters not available at this time. Will continue to monitor patient.

## 2013-07-18 NOTE — Progress Notes (Signed)
Report called to [facility] on 5W. Will be transferring patient shortly.

## 2013-07-19 DIAGNOSIS — R0602 Shortness of breath: Secondary | ICD-10-CM

## 2013-07-19 DIAGNOSIS — R0609 Other forms of dyspnea: Secondary | ICD-10-CM

## 2013-07-19 DIAGNOSIS — E785 Hyperlipidemia, unspecified: Secondary | ICD-10-CM

## 2013-07-19 LAB — CBC
Hemoglobin: 14.2 g/dL (ref 13.0–17.0)
MCV: 100.6 fL — ABNORMAL HIGH (ref 78.0–100.0)
Platelets: 253 10*3/uL (ref 150–400)
RBC: 4.69 MIL/uL (ref 4.22–5.81)
WBC: 7.2 10*3/uL (ref 4.0–10.5)

## 2013-07-19 LAB — CULTURE, BLOOD (ROUTINE X 2): Culture: NO GROWTH

## 2013-07-19 LAB — GLUCOSE, CAPILLARY
Glucose-Capillary: 116 mg/dL — ABNORMAL HIGH (ref 70–99)
Glucose-Capillary: 139 mg/dL — ABNORMAL HIGH (ref 70–99)
Glucose-Capillary: 121 mg/dL — ABNORMAL HIGH (ref 70–99)

## 2013-07-19 LAB — BASIC METABOLIC PANEL
CO2: 33 mEq/L — ABNORMAL HIGH (ref 19–32)
Chloride: 99 mEq/L (ref 96–112)
Creatinine, Ser: 0.73 mg/dL (ref 0.50–1.35)
Glucose, Bld: 108 mg/dL — ABNORMAL HIGH (ref 70–99)
Sodium: 141 mEq/L (ref 135–145)

## 2013-07-19 MED ORDER — PNEUMOCOCCAL VAC POLYVALENT 25 MCG/0.5ML IJ INJ
0.5000 mL | INJECTION | INTRAMUSCULAR | Status: AC
  Filled 2013-07-19: qty 0.5

## 2013-07-19 NOTE — Progress Notes (Signed)
PULMONARY  / CRITICAL CARE MEDICINE  Name: Bryce [name] MRN: [id number] DOB: 1946-11-27    ADMISSION DATE:  07/13/2013  PRIMARY SERVICE: PCCM   CHIEF COMPLAINT:  SOB, hypercarbic respiratory failure.  BRIEF PATIENT DESCRIPTION:  67 years old male with PMH relevant for DM, HTN, diastolic CHF and obesity. Presents with progressive SOB. He was found to be in hypercarbic respiratory failure. He was placed on BiPAP but failed and required intubation and mechanical ventilation.    SIGNIFICANT EVENTS / STUDIES:  TTE 8/4 >> LVEF 55-60%, hypertrophy, mod dilated RV  LINES / TUBES: - Left subclavian CVC 07/13/13 >>  - ETT 07/13/13  >> 8/5 (self extub) - ETT 8/5 >>   CULTURES: Blood cultures 8/3 >> no growth Urine cultures 8/3 >> negative Sputum cultures 8/4 >> moraxella (b-lactamase +)  ANTIBIOTICS: Imipenem 07/13/13 >> 8/5 Vancomycin 07/13/13 >> 8/4 azithro 8/4 >> 8/5 Ceftriaxone 8/5 >>   SUBJECTIVE:  Awake and interacting Wants to go home Able to take some sips water  VITAL SIGNS: Temp:  [98 F (36.7 C)-99.5 F (37.5 C)] 98.1 F (36.7 C) (08/09 0604) Pulse Rate:  [79-103] 79 (08/09 0604) Resp:  [16-22] 18 (08/09 0604) BP: (115-166)/(69-108) 140/84 mmHg (08/09 0604) SpO2:  [90 %-100 %] 98 % (08/09 0604) Weight:  [124.5 kg (274 lb 7.6 oz)] 124.5 kg (274 lb 7.6 oz) (08/09 0610) HEMODYNAMICS:   VENTILATOR SETTINGS:   INTAKE / OUTPUT: Intake/Output     08/08 0701 - 08/09 0700 08/09 0701 - 08/10 0700   P.O. 450    I.V. (mL/kg) 700 (5.6)    NG/GT     IV Piggyback     Total Intake(mL/kg) 1150 (9.2)    Urine (mL/kg/hr) 1550 (0.5)    Total Output 1550     Net -400          Stool Occurrence 3 x      PHYSICAL EXAMINATION: General: no apparent distress. Eyes: Anicteric sclerae. ENT:  neg Lymph: No cervical, supraclavicular, or axillary lymphadenopathy. Heart: Normal S1, S2. No murmurs, rubs, or gallops appreciated. No bruits, equal pulses. Lungs: decr BS bilat, scat  rales at bases Abdomen: Abdomen soft, non-tender and not distended, normoactive bowel sounds. No hepatosplenomegaly or masses. Musculoskeletal: No clubbing or synovitis. Bilateral LE edema with chronic skin changes likely related to venous insufficiency. Skin: Chronic skin changes to both LE's  likely related to venous insufficiency  Neuro: awake and nodding appropriately to questions.   LABS:  CBC Recent Labs     07/17/13  0500  07/18/13  0500  07/19/13  0535  WBC  7.1  7.3  7.2  HGB  12.8*  13.3  14.2  HCT  42.6  45.6  47.2  PLT  230  230  253   Coag's No results found for this basename: APTT, INR,  in the last 72 hours BMET Recent Labs     07/17/13  0500  07/18/13  0500  07/19/13  0535  NA  143  145  141  K  3.4*  3.3*  4.2  CL  102  100  99  CO2  36*  36*  33*  BUN  16  13  8   CREATININE  0.99  0.80  0.73  GLUCOSE  111*  100*  108*   Electrolytes Recent Labs     07/17/13  0500  07/18/13  0500  07/19/13  0535  CALCIUM  8.7  9.0  9.4   Sepsis Markers No  results found for this basename: LACTICACIDVEN, PROCALCITON, O2SATVEN,  in the last 72 hours ABG Recent Labs     07/16/13  1019  PHART  7.482*  PCO2ART  50.3*  PO2ART  37.0*   Liver Enzymes No results found for this basename: AST, ALT, ALKPHOS, BILITOT, ALBUMIN,  in the last 72 hours Cardiac Enzymes No results found for this basename: TROPONINI, PROBNP,  in the last 72 hours Glucose Recent Labs     07/18/13  0427  07/18/13  0755  07/18/13  1148  07/18/13  1539  07/18/13  1955  07/18/13  2224  GLUCAP  85  109*  129*  130*  128*  132*    Imaging Dg Chest Port 1 View  07/18/2013   *RADIOLOGY REPORT*  Clinical Data: Follow up basilar atelectasis and/or pneumonia and vascular congestion.  PORTABLE CHEST - 1 VIEW  Comparison: Portable chest x-rays 07/16/2013 dating back to 07/13/2013.  Findings: Left subclavian central venous catheter tip projects over the upper SVC.  Cardiac silhouette enlarged.   Pulmonary venous hypertension without overt edema, slightly improved since the examination 2 days ago.  Stable dense consolidation in the left lower lobe and confluent airspace opacities at the right lung base. No new pulmonary parenchymal abnormalities.  IMPRESSION: Improved pulmonary venous hypertension/pulmonary edema since 2 days ago.  Stable bilateral lower lobe atelectasis and/or pneumonia, left greater than right.  No new abnormalities.   Original Report Authenticated By: Hulan Saas, M.D.    ASSESSMENT / PLAN:  PULMONARY A: 1) Acute on chronic hypercarbic and hypoxic respiratory failure- uinclear etiology.  COPD / pneumonia vs pulm edema/CHF- bedside echo 8/3 showed a hyperdynamic LV. Suspect largest component here is pulm edema. P:   - NEBS w/Albuterol / Ipratropium Q6h - abx => Rocephin 1gm/d - diuresis w/ [id number], KCl is currently 40Tid - Bipap & IS  CARDIOVASCULAR A:  1) History of diastolic CHF- bedside echo hyperdynamic 2) Hypotension after sedation and lasix in the ED, resolved P:  - restart home antihypertensives > norvasc 10 + clonidine 0.2 bid - diuresis as his BP and renal fxn will tolerate; added lasix 40mg  IV q8h on 8/5; decrease on 8/8 to 40 PO bid (double his home dose) + KCl- currently tid (K=4.2 today). - IV is at 50cc/hr & we will decr to [facility], watch I&O  RENAL A:   1) Hypokalemia 2) hypophosphatemia P:   - replace K and phos - Follow BMP  GASTROINTESTINAL A:   1) Swallowing eval=> Dysphagia II w/ thin liq & nutritional supplements. P:   - GI prophylaxis with protonix - swallow eval and PO diet  HEMATOLOGIC A:   1) No issues P:  - Follow CBC  INFECTIOUS A:   1) Possible CAP, moraxella   2) Penicillin allergy (unknown severity) P:   - day 6/8 abx on 8/8 - changed imipenem to ceftriaxone, caution of PCN allergy 8/5 - d/c azithro 8/5  ENDOCRINE A:   1) DM2  P:   - Novolog sliding scale - Hold oral diabetes  medications  NEUROLOGIC A:   1) mild confusion, improving P:   - follow     Oneida Mckamey M. Kriste Basque, MD  07/19/2013, 7:49 AM [facility]

## 2013-07-20 ENCOUNTER — Inpatient Hospital Stay (HOSPITAL_COMMUNITY): Payer: Medicare Other

## 2013-07-20 DIAGNOSIS — E872 Acidosis: Secondary | ICD-10-CM

## 2013-07-20 LAB — COMPREHENSIVE METABOLIC PANEL
ALT: 15 U/L (ref 0–53)
AST: 18 U/L (ref 0–37)
Albumin: 2.6 g/dL — ABNORMAL LOW (ref 3.5–5.2)
CO2: 36 mEq/L — ABNORMAL HIGH (ref 19–32)
Calcium: 9.4 mg/dL (ref 8.4–10.5)
Creatinine, Ser: 0.68 mg/dL (ref 0.50–1.35)
Sodium: 139 mEq/L (ref 135–145)
Total Protein: 7.2 g/dL (ref 6.0–8.3)

## 2013-07-20 LAB — GLUCOSE, CAPILLARY
Glucose-Capillary: 124 mg/dL — ABNORMAL HIGH (ref 70–99)
Glucose-Capillary: 129 mg/dL — ABNORMAL HIGH (ref 70–99)
Glucose-Capillary: 119 mg/dL — ABNORMAL HIGH (ref 70–99)
Glucose-Capillary: 245 mg/dL — ABNORMAL HIGH (ref 70–99)

## 2013-07-20 MED ORDER — ACETAZOLAMIDE 250 MG PO TABS
500.0000 mg | ORAL_TABLET | Freq: Once | ORAL | Status: AC
  Administered 2013-07-20: 500 mg via ORAL
  Filled 2013-07-20: qty 2

## 2013-07-20 NOTE — Progress Notes (Signed)
PULMONARY  / CRITICAL CARE MEDICINE  Name: Bryce [name] MRN: [id number] DOB: 15-Dec-1945    ADMISSION DATE:  07/13/2013  PRIMARY SERVICE: PCCM  CHIEF COMPLAINT:  SOB, hypercarbic respiratory failure.  BRIEF PATIENT DESCRIPTION:  67 years old male with PMH relevant for DM, HTN, diastolic CHF and obesity. Presents with progressive SOB. He was found to be in hypercarbic respiratory failure. He was placed on BiPAP but failed and required intubation and mechanical ventilation.    SIGNIFICANT EVENTS / STUDIES:  TTE 8/4 >> LVEF 55-60%, hypertrophy, mod dilated RV  LINES / TUBES: - Left subclavian CVC 07/13/13 >>  - ETT 07/13/13  >> 8/5 (self extub) - ETT 8/5 >>   CULTURES: Blood cultures 8/3 >> no growth Urine cultures 8/3 >> negative Sputum cultures 8/4 >> moraxella (b-lactamase +)  ANTIBIOTICS: Imipenem 07/13/13 >> 8/5 Vancomycin 07/13/13 >> 8/4 azithro 8/4 >> 8/5 Ceftriaxone 8/5 >>   SUBJECTIVE:  Awake and interacting Wants to go home Able to take some sips water  VITAL SIGNS: Temp:  [97.7 F (36.5 C)-98.6 F (37 C)] 97.7 F (36.5 C) (08/10 0544) Pulse Rate:  [72-95] 72 (08/10 0544) Resp:  [16-18] 16 (08/10 0544) BP: (106-124)/(66-86) 106/66 mmHg (08/10 0544) SpO2:  [94 %-98 %] 94 % (08/10 0544) FiO2 (%):  [36 %] 36 % (08/09 1455) Weight:  [124.5 kg (274 lb 7.6 oz)] 124.5 kg (274 lb 7.6 oz) (08/10 0544) HEMODYNAMICS:   VENTILATOR SETTINGS: Vent Mode:  [-]  FiO2 (%):  [36 %] 36 % INTAKE / OUTPUT: Intake/Output     08/09 0701 - 08/10 0700 08/10 0701 - 08/11 0700   P.O. 360    I.V. (mL/kg) 1024.2 (8.2)    Total Intake(mL/kg) 1384.2 (11.1)    Urine (mL/kg/hr)  425 (4.2)   Total Output   425   Net +1384.2 -425         PHYSICAL EXAMINATION: General: no apparent distress. Eyes: Anicteric sclerae. ENT:  neg Lymph: No cervical, supraclavicular, or axillary lymphadenopathy. Heart: Normal S1, S2. No murmurs, rubs, or gallops appreciated. No bruits, equal pulses. Lungs:  decr BS bilat, scat rales at bases Abdomen: Abdomen soft, non-tender and not distended, normoactive bowel sounds. No hepatosplenomegaly or masses. Musculoskeletal: No clubbing or synovitis. Bilateral LE edema with chronic skin changes likely related to venous insufficiency. Skin: Chronic skin changes to both LE's  likely related to venous insufficiency  Neuro: awake and nodding appropriately to questions.   LABS:  CBC Recent Labs     07/18/13  0500  07/19/13  0535  WBC  7.3  7.2  HGB  13.3  14.2  HCT  45.6  47.2  PLT  230  253   Coag's No results found for this basename: APTT, INR,  in the last 72 hours BMET Recent Labs     07/18/13  0500  07/19/13  0535  07/20/13  0500  NA  145  141  139  K  3.3*  4.2  3.5  CL  100  99  95*  CO2  36*  33*  36*  BUN  13  8  8   CREATININE  0.80  0.73  0.68  GLUCOSE  100*  108*  141*   Electrolytes Recent Labs     07/18/13  0500  07/19/13  0535  07/20/13  0500  CALCIUM  9.0  9.4  9.4   Sepsis Markers No results found for this basename: LACTICACIDVEN, PROCALCITON, O2SATVEN,  in the last 72 hours  ABG No results found for this basename: PHART, PCO2ART, PO2ART,  in the last 72 hours Liver Enzymes Recent Labs     07/20/13  0500  AST  18  ALT  15  ALKPHOS  72  BILITOT  0.3  ALBUMIN  2.6*   Cardiac Enzymes No results found for this basename: TROPONINI, PROBNP,  in the last 72 hours Glucose Recent Labs     07/18/13  1955  07/18/13  2224  07/19/13  0811  07/19/13  1142  07/19/13  1722  07/19/13  2124  GLUCAP  128*  132*  116*  139*  115*  121*    Imaging CXR 07/18/13>  IMPRESSION:  Improved pulmonary venous hypertension/pulmonary edema since 2 days ago. Stable bilateral lower lobe atelectasis and/or pneumonia, left greater than right. No new abnormalities.    ASSESSMENT / PLAN:  PULMONARY A: 1) Acute on chronic hypercarbic and hypoxic respiratory failure- unclear etiology.  COPD / pneumonia vs pulm edema/CHF-  bedside echo 8/3 showed a hyperdynamic LV.  Suspect largest component here is pulm edema. P:   - NEBS w/Albuterol / Ipratropium Q6h - abx => Rocephin 1gm/d - diuresis w/ [id number], KCl is currently 40Tid - Bipap & IS - Diamox given 8/10; may need to add aldactone w/ diastolic dysfunction & large K requirement.  CARDIOVASCULAR A:  1) History of diastolic CHF- bedside echo hyperdynamic 2) Hypotension after sedation and lasix in the ED, resolved P:  - restart home antihypertensives > norvasc 10 + clonidine 0.2 bid - diuresis as his BP and renal fxn will tolerate; added lasix 40mg  IV q8h on 8/5; decrease on 8/8 to 40 PO bid (double his home dose) + KCl- currently tid (K=3.5 today). - IV is at 50cc/hr & we will decr to [facility], watch I&O  RENAL A:   1) Hypokalemia 2) hypophosphatemia P:   - replace K and phos - Follow BMP  GASTROINTESTINAL A:   1) Swallowing eval=> Dysphagia II w/ thin liq & nutritional supplements. P:   - GI prophylaxis with protonix - swallow eval and PO diet  HEMATOLOGIC A:   1) No issues P:  - Follow CBC  INFECTIOUS A:   1) Possible CAP, moraxella   2) Penicillin allergy (unknown severity) P:   - day 6/8 abx on 8/8 - changed imipenem to ceftriaxone, caution of PCN allergy 8/5 - d/c azithro 8/5  ENDOCRINE A:   1) DM2  P:   - Novolog sliding scale - Hold oral diabetes medications  NEUROLOGIC A:   1) mild confusion, improving P:   - follow     Scott M. Kriste Basque, MD  07/20/2013, 7:49 AM [facility]

## 2013-07-21 DIAGNOSIS — I5032 Chronic diastolic (congestive) heart failure: Secondary | ICD-10-CM

## 2013-07-21 DIAGNOSIS — F17200 Nicotine dependence, unspecified, uncomplicated: Secondary | ICD-10-CM

## 2013-07-21 LAB — BASIC METABOLIC PANEL
BUN: 10 mg/dL (ref 6–23)
Calcium: 9.4 mg/dL (ref 8.4–10.5)
Creatinine, Ser: 0.85 mg/dL (ref 0.50–1.35)
GFR calc Af Amer: >90 mL/min (ref >90)

## 2013-07-21 LAB — GLUCOSE, CAPILLARY
Glucose-Capillary: 122 mg/dL — ABNORMAL HIGH (ref 70–99)
Glucose-Capillary: 143 mg/dL — ABNORMAL HIGH (ref 70–99)
Glucose-Capillary: 95 mg/dL (ref 70–99)
Glucose-Capillary: 111 mg/dL — ABNORMAL HIGH (ref 70–99)

## 2013-07-21 MED ORDER — SPIRONOLACTONE 25 MG PO TABS
25.0000 mg | ORAL_TABLET | Freq: Every day | ORAL | Status: DC
  Administered 2013-07-21 – 2013-07-23 (×2): 25 mg via ORAL
  Filled 2013-07-21 (×3): qty 1

## 2013-07-21 MED ORDER — BOOST / RESOURCE BREEZE PO LIQD
1.0000 | Freq: Two times a day (BID) | ORAL | Status: DC | PRN
  Administered 2013-07-23: 1 via ORAL

## 2013-07-21 NOTE — Progress Notes (Signed)
Physical Therapy Treatment Patient Details Name: Bryce [name] MRN: [id number] DOB: 1946/10/05 Today's Date: 07/21/2013 Time: 4098-1191 PT Time Calculation (min): 14 min  PT Assessment / Plan / Recommendation  History of Present Illness Pt is an obese 67 y/o male admitted with acute respiratory failure with hypercapnia   PT Comments   Pt quickly agitated when PT entered room, and did not initially agree to physical therapy. Pt complaining about needing lotion on feet, and therapist applied some lotion to bilateral feet. Pt then agreed to minimal exercise, however refused any bed mobility or transfers. Some conversation did not make sense, however pt stayed in an agitated state throughout session. Pt requesting IV out and information from MD about d/c. Nursing notified. Pt wondering how he was going to get out of the bed and into his car at d/c - pt education on benefits of SNF followed.   Follow Up Recommendations  SNF     Does the patient have the potential to tolerate intense rehabilitation     Barriers to Discharge        Equipment Recommendations  None recommended by PT    Recommendations for Other Services    Frequency Min 2X/week   Progress towards PT Goals Progress towards PT goals: Not progressing toward goals - comment (Pt refuses to perform any functional mobility)  Plan Current plan remains appropriate    Precautions / Restrictions Precautions Precautions: Fall Restrictions Weight Bearing Restrictions: No   Pertinent Vitals/Pain Pt reports no pain throughout session, however at end of session reports that he wants his IV out because it is hurting his hand.    Mobility  Bed Mobility Bed Mobility: Not assessed Transfers Transfers: Not assessed Ambulation/Gait Ambulation/Gait Assistance: Not tested (comment)    Exercises General Exercises - Upper Extremity Shoulder Flexion: 5 reps;Both Shoulder ABduction: 5 reps;Both General Exercises - Lower Extremity Ankle  Circles/Pumps: 5 reps;Both Straight Leg Raises: 10 reps;Both   PT Diagnosis:    PT Problem List:   PT Treatment Interventions:     PT Goals (current goals can now be found in the care plan section) Acute Rehab PT Goals Patient Stated Goal: none stated PT Goal Formulation: With patient Time For Goal Achievement: 08/01/13 Potential to Achieve Goals: Fair  Visit Information  Last PT Received On: 07/21/13 Assistance Needed: +2 History of Present Illness: Pt is a 67 y/o male admitted with acute respiratory failure with hypercapnia    Subjective Data  Subjective: "I don't have to do nothing with noone!" Patient Stated Goal: none stated   Cognition  Cognition Arousal/Alertness: Awake/alert Behavior During Therapy: WFL for tasks assessed/performed Overall Cognitive Status: No family/caregiver present to determine baseline cognitive functioning    Balance     End of Session PT - End of Session Activity Tolerance: Treatment limited secondary to agitation (Pt refused to participate with most of therapy session) Patient left: in bed;with call bell/phone within reach;with nursing/sitter in room   GP     Ruthann Cancer 07/21/2013, 3:13 PM  Ruthann Cancer, PT Acute Rehabilitation Services
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
## 2013-07-21 NOTE — Progress Notes (Signed)
Speech Language Pathology Dysphagia Treatment Patient Details Name: Bryce [name] MRN: [id number] DOB: 12/10/1946 Today's Date: 07/21/2013 Time: 1400-1430 SLP Time Calculation (min): 30 min  Assessment / Plan / Recommendation Clinical Impression  Pt. appears to be tolerating po's relatively well.  LS are diminished but clear, pt. is afebrile, and no overt s/s of aspiration are noted with sips of thin liquid from straw.  Current biggest risk is due to the Bariatric bed in which pt. is having to eat/drink/take meds in reclined position, as HOB will not elevate greater than 45 degrees.  Pillows should be placed behind pt.'s back to obtain a more upright position for eating and drinking.    Diet Recommendation  Continue with Current Diet: Dysphagia 2 (fine chop);Thin liquid    SLP Plan Continue with current plan of care   Pertinent Vitals/Pain n/a   Swallowing Goals  SLP Swallowing Goals Patient will consume recommended diet without observed clinical signs of aspiration with: Minimal assistance Swallow Study Goal #1 - Progress: Progressing toward goal Patient will utilize recommended strategies during swallow to increase swallowing safety with: Minimal cueing Swallow Study Goal #2 - Progress: Progressing toward goal  General Temperature Spikes Noted: No Respiratory Status: Supplemental O2 delivered via (comment) Behavior/Cognition: Alert;Cooperative;Pleasant mood Oral Cavity - Dentition: Adequate natural dentition;Missing dentition Patient Positioning: Partially reclined  Oral Cavity - Oral Hygiene Does patient have any of the following "at risk" factors?: Oxygen therapy - cannula, mask, simple oxygen devices;Lips - dry, cracked Brush patient's teeth BID with toothbrush (using toothpaste with fluoride): Yes Patient is HIGH RISK - Oral Care Protocol followed (see row info): Yes Patient is mechanically ventilated, follow VAP prevention protocol for oral care: Oral care provided every 4  hours   Dysphagia Treatment Treatment focused on: Skilled observation of diet tolerance;Patient/family/caregiver education Family/Caregiver Educated: Patient Treatment Methods/Modalities: Skilled observation Patient observed directly with PO's: Yes Type of PO's observed: Thin liquids Feeding: Needs assist;Needs set up Liquids provided via: Straw Type of cueing: Verbal Amount of cueing: Minimal   GO     [name] T 07/21/2013, 3:59 PM

## 2013-07-21 NOTE — Progress Notes (Signed)
Pt refused to take his abx through his IV line. Demanded to have IV removed and refused to have new IV placed. Told pt the IV team would come and place new IV but pt still refused. Removed IV per pt request. MD notified.  Peter Congo RN

## 2013-07-21 NOTE — Progress Notes (Signed)
Pt is refusing bipap at this time, well continue to monitor.

## 2013-07-21 NOTE — Progress Notes (Signed)
Called by RN, patient is refusing IV access and abx.  Rounding MD informed, RN asked to document refusal and rounding MD will address in AM.  Alyson Reedy, M.D. [facility] Pulmonary/Critical Care Medicine. Pager: [phone]. After hours pager: [phone].

## 2013-07-21 NOTE — Clinical Social Work Psychosocial (Signed)
Clinical Social Work Department BRIEF PSYCHOSOCIAL ASSESSMENT 07/21/2013  Patient:  Bryce [name], Bryce [name]     Account Number:  [id number]     Admit date:  07/13/2013  Clinical Social Worker:  Lavell Luster  Date/Time:  07/21/2013 09:34 PM  Referred by:  Physician  Date Referred:  07/21/2013 Referred for  SNF Placement   Other Referral:   Interview type:  Patient Other interview type:    PSYCHOSOCIAL DATA Living Status:  ALONE Admitted from facility:   Level of care:   Primary support name:  [name] Primary support relationship to patient:  SIBLING Degree of support available:   Patient states that he has a sister [name]) and several children that help him. CSW has not seen these family members in hospital so degree of support may be low.    CURRENT CONCERNS Current Concerns  Post-Acute Placement   Other Concerns:    SOCIAL WORK ASSESSMENT / PLAN Patient is refusing SNF placement at this time. Patient is also refusing placement of IV, food, and bibap. Patient insists that he can take care of himself at home and can do all ADLs at home without assistance. CSW explained the importance of following the recommendation for SNF but patient still refused and stated "I'm going home, you can't keep me here. You can't make me go anywhere." CSW explained that patient does have this choice. Patient claims to have family support at home including a sister named [name], a niece, a nephew, and several children that can help him, but patient was unable to provide contact information for these family members. Patient did state that he would be willing to continue [facility] services. CSW spoke with MD after assessment. MD and CSW are concerned for the wellbeing of patient and do not believe that it is safe for him to return home alone. MD has requested psych consult to determine competence of patient. CSW will continue to follow patient and moved forward with SNF placement if patient is determined to be  incompetent by psych.   Assessment/plan status:  Psychosocial Support/Ongoing Assessment of Needs Other assessment/ plan:   Complete FL2, PASRR Screening.   Information/referral to community resources:   Patient offered SNF list but refused to accept it.    PATIENT'S/FAMILY'S RESPONSE TO PLAN OF CARE: Patient refuses SNF and plans to go home alone with continued [facility] services.     [name], [place], [place], [phone]

## 2013-07-21 NOTE — Progress Notes (Signed)
PULMONARY  / CRITICAL CARE MEDICINE  Name: Bryce [name] MRN: [id number] DOB: October 12, 1946    ADMISSION DATE:  07/13/2013  PRIMARY SERVICE: PCCM   CHIEF COMPLAINT:  SOB, hypercarbic respiratory failure.  BRIEF PATIENT DESCRIPTION:  67 years old male with PMH relevant for DM, HTN, diastolic CHF and obesity. Presents with progressive SOB. He was found to be in hypercarbic respiratory failure. He was placed on BiPAP but failed and required intubation and mechanical ventilation.    SIGNIFICANT EVENTS / STUDIES:  TTE 8/4 >> LVEF 55-60%, hypertrophy, mod dilated RV  LINES / TUBES: - Left subclavian CVC 07/13/13 >>  - ETT 07/13/13  >> 8/5 (self extub) - ETT 8/5 >>   CULTURES: Blood cultures 8/3 >> no growth Urine cultures 8/3 >> negative Sputum cultures 8/4 >> moraxella (b-lactamase +)  ANTIBIOTICS: Imipenem 07/13/13 >> 8/5 Vancomycin 07/13/13 >> 8/4 azithro 8/4 >> 8/5 Ceftriaxone 8/5 >> 8/11  SUBJECTIVE:  Awake and interacting Wants to go home, does not seem to realize limitations,   VITAL SIGNS: Temp:  [98.4 F (36.9 C)-98.6 F (37 C)] 98.4 F (36.9 C) (08/11 0518) Pulse Rate:  [66-94] 68 (08/11 0518) Resp:  [16-18] 16 (08/11 0518) BP: (99-129)/(65-82) 114/72 mmHg (08/11 0518) SpO2:  [92 %-98 %] 95 % (08/11 0927) 3 liters  HEMODYNAMICS:   VENTILATOR SETTINGS:   INTAKE / OUTPUT: Intake/Output     08/10 0701 - 08/11 0700 08/11 0701 - 08/12 0700   P.O. 60    I.V. (mL/kg)     Total Intake(mL/kg) 60 (0.5)    Urine (mL/kg/hr) 1875 (0.6)    Total Output 1875     Net -1815          Stool Occurrence 1 x      PHYSICAL EXAMINATION: General: no apparent distress. Eyes: Anicteric sclerae. ENT:  neg Lymph: No cervical, supraclavicular, or axillary lymphadenopathy. Heart: Normal S1, S2. No murmurs, rubs, or gallops appreciated. No bruits, equal pulses. Lungs: decr BS bilat Abdomen: Abdomen soft, non-tender and not distended, normoactive bowel sounds. No hepatosplenomegaly or  masses. Musculoskeletal: No clubbing or synovitis. Bilateral LE edema with chronic skin changes likely related to venous insufficiency. Skin: Chronic skin changes to both LE's  likely related to venous insufficiency  Neuro: awake and nodding appropriately to questions.   LABS:  CBC Recent Labs     07/19/13  0535  WBC  7.2  HGB  14.2  HCT  47.2  PLT  253   Coag's No results found for this basename: APTT, INR,  in the last 72 hours BMET Recent Labs     07/19/13  0535  07/20/13  0500  07/21/13  0625  NA  141  139  139  K  4.2  3.5  3.6  CL  99  95*  95*  CO2  33*  36*  37*  BUN  8  8  10   CREATININE  0.73  0.68  0.85  GLUCOSE  108*  141*  108*   Electrolytes Recent Labs     07/19/13  0535  07/20/13  0500  07/21/13  0625  CALCIUM  9.4  9.4  9.4   Sepsis Markers No results found for this basename: LACTICACIDVEN, PROCALCITON, O2SATVEN,  in the last 72 hours ABG No results found for this basename: PHART, PCO2ART, PO2ART,  in the last 72 hours Liver Enzymes Recent Labs     07/20/13  0500  AST  18  ALT  15  ALKPHOS  72  BILITOT  0.3  ALBUMIN  2.6*   Cardiac Enzymes No results found for this basename: TROPONINI, PROBNP,  in the last 72 hours Glucose Recent Labs     07/19/13  2124  07/20/13  0748  07/20/13  1240  07/20/13  1637  07/20/13  2113  07/21/13  0801  GLUCAP  121*  124*  129*  119*  245*  122*    Imaging Dg Chest Port 1 View  07/20/2013   *RADIOLOGY REPORT*  Clinical Data: Follow up edema  PORTABLE CHEST - 1 VIEW  Comparison: 07/18/2013  Findings: Central disc catheter has been removed.  No pneumothorax.  Mild improvement in bibasilar consolidation.  Improvement in mild vascular congestion.  No significant effusion.  IMPRESSION: Mild improvement in bilateral airspace disease.  This may be due to pneumonia.  Mild heart failure may also be present.   Original Report Authenticated By: Janeece Riggers, M.D.    ASSESSMENT / PLAN:  Acute on chronic  hypercarbic and hypoxic respiratory failure- unclear etiology. Favor progressive OSA/OHS. He does not have CPAP or BIPAP at home per his home health caregiver. - bedside echo 8/3 showed a hyperdynamic LV. Suspect largest component here is pulm edema.  COPD / pneumonia vs pulm edema/CHF Plan:   - NEBS w/Albuterol / Ipratropium Q6h -wean FIO2 -am ABG, need to assess for hypercarbia -would benefit from formal PSG but doubt he will be compliant with anything more than oxygen.  -NIPPV qhs if he will wear it   History of diastolic CHF- bedside echo hyperdynamic Hypotension after sedation and lasix in the ED, resolved Plan:  - restart home antihypertensives > norvasc 10 + clonidine 0.2 bid - diuresis as his BP and renal fxn will tolerate; added lasix 40mg  IV q8h on 8/5; decrease on 8/8 to 40 PO bid (double his home dose) + KCl- currently tid (K=4.2 today). -add back spironolactone    Dysphagia II w/ thin liq & nutritional supplements. Plan - GI prophylaxis with protonix - swallow eval and PO diet  Possible CAP, moraxella   Penicillin allergy (unknown severity) Plan Course completed, will d/c   DM2  Plan - Novolog sliding scale - Hold oral diabetes medications  Delirium.  This seems to have improved however after prolonged discussion with his care-giver from home health [name]), I am deeply concerned about [name] capacity to make medical decisions on his own behalf. At baseline he has limited activity tolerance. He is mobile from bed to chair only. He heavily smokes and is non-compliant with both his medications and oxygen. Do not believe that [name] is safe for d/c to home with only 2 hours a day of care. He does not seem to grasp the severity of his illness and his only response to frank discussion is "God's in charge"  Plan -cont supportive care -social work already on case -will ask for psych eval to determine medical competence (called)  Kreg Shropshire,  ACNP  Attending Addendum:  I agree with the above. I do not believe that [name] can safely go home, even with [facility] assistance. He will need SNF most likely. PT recommends the same  Levy Pupa, MD, PhD 07/21/2013, 11:42 AM [place] [facility] [phone] or if no answer [phone]

## 2013-07-21 NOTE — Progress Notes (Signed)
RN noted swelling to Lt hand and LFA, IVF stopped. RN attempted to discontinue site and restart IV but patient refused x3 attempts. Pt also refused to let RN remove infiltrated IV site. Pt states " If you take this IV out, I'm not getting another one because I am going home today, just leave it." Rn attempted to educate patient that site wasn't any good however pt insist it be left in until he sees the doctor today. Infiltrated NSL to LPFA remains in place . Pt in bed resting, will continue to monitor and assist as needed. Julien Nordmann [facility]

## 2013-07-22 LAB — BASIC METABOLIC PANEL
CO2: 32 mEq/L (ref 19–32)
Calcium: 9.5 mg/dL (ref 8.4–10.5)
Creatinine, Ser: 0.83 mg/dL (ref 0.50–1.35)
GFR calc Af Amer: >90 mL/min (ref >90)
Sodium: 137 mEq/L (ref 135–145)

## 2013-07-22 LAB — BLOOD GAS, ARTERIAL
Bicarbonate: 35.7 mEq/L — ABNORMAL HIGH (ref 20.0–24.0)
Patient temperature: 98.6
TCO2: 37.8 mmol/L (ref 0–100)
pCO2 arterial: 67 mmHg (ref 35.0–45.0)
pH, Arterial: 7.346 — ABNORMAL LOW (ref 7.350–7.450)

## 2013-07-22 LAB — GLUCOSE, CAPILLARY
Glucose-Capillary: 178 mg/dL — ABNORMAL HIGH (ref 70–99)
Glucose-Capillary: 100 mg/dL — ABNORMAL HIGH (ref 70–99)

## 2013-07-22 MED ORDER — NICOTINE 7 MG/24HR TD PT24
7.0000 mg | MEDICATED_PATCH | Freq: Every day | TRANSDERMAL | Status: DC
  Administered 2013-07-22 – 2013-07-23 (×2): 7 mg via TRANSDERMAL
  Filled 2013-07-22 (×2): qty 1

## 2013-07-22 NOTE — Progress Notes (Addendum)
Received referral from inpatient [facility] and inpatient LCSW for [facility] Care Management services. Met with patient at bedside along with inpatient LCSW and inpatient RNCM. Patient lives alone. However, he has a aide 3 hours a day, 7 days a week. He has a supportive nephew and sister, Bryce [name] and Bryce [name]. The plan is for the nephew to stay with patient at home post hospital discharge apparently. Patient adamant about going home and not SNF. Explained [facility] Care Management services and consents were signed. Patient will receive post hospital discharge call and will be evaluated for monthly home visits. Bryce [name] also to have home health care services arranged as well. [facility] Care Management services will not take the place of home health services. Patient reports his nephew takes him to MD appointments.  Raiford Noble, MSN- Ed, RN,BSN- [facility] Liaison-[phone]
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
## 2013-07-22 NOTE — Clinical Social Work Note (Signed)
CSW, RNCM, and [facility] hospital liaison met with patient to determine which services will be in place at home for patient. Patient is expected to DC home 07/23/13. PT recommended SNF, but patient has refused SNF placement. MD ordered psych consult, and patient was determined to have capacity and to be competent. RNCM has ordered [facility] services for patient (2-3 times per week), patient has nurse aide at home 7 days a weeks for 3 hours each day, patient will be followed by [facility], and patient's great nephew [name] will stay with the patient when nurse aid is not at the home. Rasheed's contact info is [phone]. [name] currently lives with patient's sister [name] who can also be reached at [phone]. CSW confirmed this plan with [name]. [name] was not available when CSW called to confirm plan. Patient stated that he will not need an ambulance transfer home, but this needs to be confirmed with [name] and [place]. CSW will continue to follow until DC.   [name], [place], [place], [phone]

## 2013-07-22 NOTE — Progress Notes (Signed)
PULMONARY  / CRITICAL CARE MEDICINE  Name: Bryce [name] MRN: [id number] DOB: February 25, 1946    ADMISSION DATE:  07/13/2013  PRIMARY SERVICE: PCCM   CHIEF COMPLAINT:  SOB, hypercarbic respiratory failure.  BRIEF PATIENT DESCRIPTION:  67 years old male with PMH relevant for DM, HTN, diastolic CHF and obesity. Presents with progressive SOB. He was found to be in hypercarbic respiratory failure. He was placed on BiPAP but failed and required intubation and mechanical ventilation.    SIGNIFICANT EVENTS / STUDIES:  TTE 8/4 >> LVEF 55-60%, hypertrophy, mod dilated RV  LINES / TUBES: Left subclavian CVC 07/13/13 >>  ETT 07/13/13  >> 8/5 (self extub) ETT 8/5 >>   CULTURES: Blood cultures 8/3 >> no growth Urine cultures 8/3 >> negative Sputum cultures 8/4 >> moraxella (b-lactamase +)  ANTIBIOTICS: Imipenem 07/13/13 >> 8/5 Vancomycin 07/13/13 >> 8/4 azithro 8/4 >> 8/5 Ceftriaxone 8/5 >> 8/11  SUBJECTIVE: Patient angry, states he wants to go home but unable to state home address  VITAL SIGNS: Temp:  [98.1 F (36.7 C)-99 F (37.2 C)] 98.4 F (36.9 C) (08/12 0457) Pulse Rate:  [64-80] 64 (08/12 0457) Resp:  [16-18] 16 (08/12 0457) BP: (104-120)/(69-80) 104/69 mmHg (08/12 0457) SpO2:  [95 %-98 %] 95 % (08/12 0739) 3 liters   INTAKE / OUTPUT: Intake/Output     08/11 0701 - 08/12 0700 08/12 0701 - 08/13 0700   P.O. 180 60   Total Intake(mL/kg) 180 (1.4) 60 (0.5)   Urine (mL/kg/hr) 900 (0.3) 400 (0.9)   Total Output 900 400   Net -720 -340          PHYSICAL EXAMINATION: General: no apparent distress. Eyes: Anicteric sclerae ENT:  neg Heart: Normal S1, S2. No murmurs, rubs, or gallops appreciated. No bruits, equal pulses. Lungs: decr BS bilat Abdomen: Abdomen soft, non-tender and not distended, normoactive bowel sounds.  Musculoskeletal: No clubbing or synovitis. Bilateral LE edema with chronic skin changes likely related to venous insufficiency. Skin: Chronic skin changes to both  LE's  likely related to venous insufficiency  Neuro: awake and nodding appropriately to questions.   LABS:  CBC No results found for this basename: WBC, HGB, HCT, PLT,  in the last 72 hours  BMET Recent Labs     07/20/13  0500  07/21/13  0625  07/22/13  0600  NA  139  139  137  K  3.5  3.6  3.8  CL  95*  95*  95*  CO2  36*  37*  32  BUN  8  10  12   CREATININE  0.68  0.85  0.83  GLUCOSE  141*  108*  122*   Electrolytes Recent Labs     07/20/13  0500  07/21/13  0625  07/22/13  0600  CALCIUM  9.4  9.4  9.5   ABG Recent Labs     07/22/13  0500  PHART  7.346*  PCO2ART  67.0*  PO2ART  77.5*   Liver Enzymes Recent Labs     07/20/13  0500  AST  18  ALT  15  ALKPHOS  72  BILITOT  0.3  ALBUMIN  2.6*   Glucose Recent Labs     07/20/13  2113  07/21/13  0801  07/21/13  1159  07/21/13  1701  07/21/13  2122  07/22/13  0738  GLUCAP  245*  122*  143*  95  111*  178*    Imaging No results found.  ASSESSMENT / PLAN:  Acute on chronic hypercarbic and hypoxic respiratory failure- unclear etiology. Favor progressive OSA/OHS. He does not have CPAP or BIPAP at home per his home health caregiver. - bedside echo 8/3 showed a hyperdynamic LV. Suspect largest component here is pulm edema.  8/12 ABG - 7.34 / 67 / 77 / 35 on 3L O2  COPD / Pneumonia vs Pulm Edema / CHF Plan:   -NEBS w/Albuterol / Ipratropium Q6h -wean FIO2 -would benefit from formal PSG but doubt he will be compliant with anything more than oxygen.  -NIPPV qhs if he will wear it    History of diastolic CHF- bedside echo hyperdynamic Hypotension after sedation and lasix in the ED, resolved Plan:  -restart home antihypertensives > norvasc 10 + clonidine 0.2 bid -diuresis as his BP and renal fxn will tolerate - decreased on 8/8 to 40 PO bid (double his home dose) + KCl.  Will need to decide home dosing.   -spironolactone    Dysphagia II w/ thin liq & nutritional supplements. Plan -GI prophylaxis  with protonix -swallow eval and PO diet  Possible CAP, moraxella   Penicillin allergy (unknown severity) Plan -Course completed  DM2  Plan - Novolog sliding scale - Hold oral diabetes medications  Delirium.  This seems to have improved however after prolonged discussion with his care-giver from home health Bryce [name]), I am deeply concerned about Bryce [name] capacity to make medical decisions on his own behalf. At baseline he has limited activity tolerance. He is mobile from bed to chair only. He heavily smokes and is non-compliant with both his medications and oxygen. Do not believe that Bryce [name] is safe for d/c to home with only 2 hours a day of care, even with [facility] assistance. He does not seem to grasp the severity of his illness and his only response to frank discussion is "God's in charge". He will need SNF most likely. PT recommends the same  Plan -cont supportive care -social work already on case -will ask for psych eval to determine medical competence (called) -if d/c home, will set up with maximum support able   Canary Brim, NP-C [facility] Pgr: [phone] or [phone]  Levy Pupa, MD, PhD 07/22/2013, 11:19 AM [facility] [phone] or if no answer [phone]

## 2013-07-22 NOTE — Discharge Summary (Signed)
Physician Discharge Summary  Patient ID: Bryce [name] MRN: [id number] DOB/AGE: 1946-09-15 67 y.o.  Admit date: 07/13/2013 Discharge date: 07/23/2013    Discharge Diagnoses:  Acute on Chronic Hypercarbic Respiratory Failure COPD Pulmonary Edema  CAP - Moraxella CHF Dysphagia Diabetes Mellitus Delirium                                                                      DISCHARGE PLAN BY DIAGNOSIS      Acute on Chronic Hypercarbic Respiratory Failure OSA / OHS COPD Pulmonary Edema  CAP - Moraxella Tobacco Abuse  Discharge Plan: -oxygen as per previous home arrangements - case management to ensure portable equipment as well  -combivent BID as needed -completed abx as inpatient -would benefit from formal PSG but doubt he will be compliant with anything more than oxygen.   CHF HLD HTN  Discharge Plan: -continue home clonidine, ASA, spironolactone, norvasc -cont hold home lopressor until follow up with PCP -will arrange oupt cards f/u as well (last saw [facility] 2010)  Dysphagia Obesity  Discharge Plan: -continue Dysphagia 2 Diet - fine chopped meats and thin liquids -needs outpatient nutritional counseling  Diabetes Mellitus  Discharge Plan: -continue metformin  Delirium  - resolved.  Deconditioning  Discharge Plan: -home PT/OT/RN/SW arranged for home -has home aide at present (who has expressed significant concerns about him being home alone / pts ability to provide self care.                 DISCHARGE SUMMARY   Bryce [name] is a 67 y.o. y/o male, current smoker, with a PMH of DM, HTN, Diastolic CHF and obesity who presented to [facility] ER on 8/3 with progressive shortness of breath.  Initial evaluation demonstrated hypercarbic respiratory failure and was placed on BiPAP for support.  However, he failed non-invasive mechanical support and required intubation.  Initial ABG of 7.20 / 110 / 114 / 33.  He was pan cultured and found to have beta-lactamase  positive moraxella in sputum and was treated accordingly with antibiotics as below. Other cultures were negative.  He remained intubated until 8/5 at which time he self extubated and required reintubation. Initial chest xray was concerning for infiltrate vs pulmonary edema.   Echo at bedside on 8/3 noted a hyperdynamic LV.  He was aggressively diuresed for volume removal.  It was felt largest component of decompensation was related to CHF.  He was successfully extubated 8/7. Post extubation, he was evaluated by SLP and placed on Dysphagia 2, nectar thin liquid diet.  Hyperglycemia was managed with sliding scale insulin.  Home metformin was held during admit.  During hospital course, it was noted that his home caregiver [name] - an aide) has significant concerns regarding patients ability for self care.  He has limited family support.  His sister endorses that she supports him emotionally but is physically unable to help with his care.  Patient was evaluated by Psychiatry prior to discharge for competency and was felt that his "mental capacity is intact and he has the ability to make informed health care decisions". [name], NP 8/13). He chooses to go home and will be dc with as much support as possible and close outpt f/u.     CONSULTS  Psychiatry            SIGNIFICANT EVENTS / STUDIES:  TTE 8/4 >> LVEF 55-60%, hypertrophy, mod dilated RV   LINES / TUBES:  Left subclavian CVC 07/13/13 >>  ETT 07/13/13 >> 8/5 (self extub)  ETT 8/5 >> 8/7  CULTURES:  Blood cultures 8/3 >> no growth  Urine cultures 8/3 >> negative  Sputum cultures 8/4 >> moraxella (b-lactamase +)   ANTIBIOTICS:  Imipenem 07/13/13 >> 8/5  Vancomycin 07/13/13 >> 8/4  azithro 8/4 >> 8/5  Ceftriaxone 8/5 >> 8/11      Discharge Exam: General: no apparent distress.  Eyes: Anicteric sclerae  ENT: neg  Heart: Normal S1, S2. No murmurs, rubs, or gallops appreciated. No bruits, equal pulses.  Lungs: decr BS bilat  Abdomen: Abdomen  soft, non-tender and not distended, normoactive bowel sounds.  Musculoskeletal: No clubbing or synovitis. Bilateral LE edema with chronic skin changes likely related to venous insufficiency.  Skin: Chronic skin changes to both LE's likely related to venous insufficiency  Neuro: awake and nodding appropriately to questions.    Filed Vitals:   07/22/13 2213 07/23/13 0242 07/23/13 0632 07/23/13 0805  BP: 110/80  106/65   Pulse:   67   Temp:   97.4 F (36.3 C)   TempSrc:   Oral   Resp:   20   Height:      Weight:      SpO2:  97% 97% 98%     Discharge Labs  BMET  Recent Labs Lab 07/18/13 0500 07/19/13 0535 07/20/13 0500 07/21/13 0625 07/22/13 0600  NA 145 141 139 139 137  K 3.3* 4.2 3.5 3.6 3.8  CL 100 99 95* 95* 95*  CO2 36* 33* 36* 37* 32  GLUCOSE 100* 108* 141* 108* 122*  BUN 13 8 8 10 12   CREATININE 0.80 0.73 0.68 0.85 0.83  CALCIUM 9.0 9.4 9.4 9.4 9.5    CBC  Recent Labs Lab 07/17/13 0500 07/18/13 0500 07/19/13 0535  HGB 12.8* 13.3 14.2  HCT 42.6 45.6 47.2  WBC 7.1 7.3 7.2  PLT 230 230 253      Medication List    STOP taking these medications       metoprolol 50 MG tablet  Commonly known as:  LOPRESSOR      TAKE these medications       ADULT ASPIRIN EC LOW STRENGTH 81 MG EC tablet  Generic drug:  aspirin  Take 81 mg by mouth daily.     amLODipine 10 MG tablet  Commonly known as:  NORVASC  Take 10 mg by mouth daily.     cloNIDine 0.2 MG tablet  Commonly known as:  CATAPRES  Take 0.2 mg by mouth 2 (two) times daily.     COMBIVENT 18-103 MCG/ACT inhaler  Generic drug:  albuterol-ipratropium  Inhale 1 puff into the lungs 2 (two) times daily as needed (breathing problems).     ferrous sulfate 325 (65 FE) MG tablet  Take 650 mg by mouth daily with breakfast.     folic acid 1 MG tablet  Commonly known as:  FOLVITE  Take 1 mg by mouth daily.     furosemide 40 MG tablet  Commonly known as:  LASIX  Take 40 mg by mouth. Every am      GLUCOPHAGE XR 500 MG 24 hr tablet  Generic drug:  metFORMIN  Take 500 mg by mouth 2 (two) times daily with a meal.     omeprazole 20  MG capsule  Commonly known as:  PRILOSEC  Take 20 mg by mouth 2 (two) times daily.     pravastatin 40 MG tablet  Commonly known as:  PRAVACHOL  Take 40 mg by mouth every evening.     spironolactone 25 MG tablet  Commonly known as:  ALDACTONE  Take 12.5 mg by mouth daily.     thiamine 100 MG tablet  Take 100 mg by mouth daily.     traMADol 50 MG tablet  Commonly known as:  ULTRAM  Take 50-100 mg by mouth 2 (two) times daily as needed for pain. Every 8-12 hours     Vitamin D 2000 UNITS tablet  Take 2,000 Units by mouth daily.          Disposition:  Home with maximum home health   Discharged Condition: Bryce [name] has met maximum benefit of inpatient care and is medically stable and cleared for discharge.  Patient is pending follow up as above.      Time spent on disposition:  Greater than 35 minutes.   SignedDanford Bad, NP 07/23/2013  10:04 AM Pager: [phone] or [phone]  *Care during the described time interval was provided by me and/or other providers on the critical care team. I have reviewed this patient's available data, including medical history, events of note, physical examination and test results as part of my evaluation.    Levy Pupa, MD, PhD 07/23/2013, 11:34 AM [facility] [phone] or if no answer [phone]

## 2013-07-22 NOTE — Progress Notes (Signed)
CRITICAL VALUE ALERT  Critical value received:  PCO2 Date of notification: 07/22/13  Time of notification:  0500  Critical value read back:yes  Nurse who received alert:  Randell Loop  MD notified (1st page)DR.Simon  Time of first page:  0530  MD notified (2nd page):  Time of second page:  Responding MD:  Dr.Simon  Time MD responded:  [phone]

## 2013-07-22 NOTE — Clinical Social Work Psych Note (Addendum)
12:19pm- Psych CSW reviewed the pt chart.  Psychiatry has not yet seen the pt.  Pt remains ready for d/c.  Psych CSW confirmed with [facility] that Psychiatry is rounding today- per [facility], Dr. Lucianne Muss.  [facility] made Dr. Lucianne Muss aware that pt is medically ready for d/c pending psychiatry evaluation.  CSW updated unit CSW.  Psych CSW confirmed that psych consult had been placed.  Psych consult was received by [facility] on 07/21/2013 at 11:17am.  Vickii Penna, LCSWA [phone]  Clinical Social Work

## 2013-07-23 ENCOUNTER — Encounter (HOSPITAL_COMMUNITY): Payer: Self-pay | Admitting: Psychiatry

## 2013-07-23 DIAGNOSIS — F411 Generalized anxiety disorder: Secondary | ICD-10-CM

## 2013-07-23 LAB — GLUCOSE, CAPILLARY: Glucose-Capillary: 107 mg/dL — ABNORMAL HIGH (ref 70–99)

## 2013-07-23 NOTE — Consult Note (Signed)
Patient seen, examined and evaluated by me. Recommendations and plan formulated by me

## 2013-07-23 NOTE — Clinical Social Work Note (Signed)
Ambulance transport requested to take patient home. Ambulance requested for 2:00 PM.  [name], [place], [place], [phone]

## 2013-07-23 NOTE — Clinical Social Work Psych Note (Signed)
Psych CSW reviewed chart.  Psychiatry has evaluated pt.  Psychiatry reports "Patient is alert and oriented x 3, mental capacity intact and has the ability to make informed health care decisions". [name], NP 07/23/2013 7:12AM)  Psych CSW informed unit CSW.  Vickii Penna, LCSWA [phone]  Clinical Social Work
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
## 2013-07-23 NOTE — Progress Notes (Signed)
Speech Language Pathology Dysphagia Treatment Patient Details Name: Bryce [name] MRN: [id number] DOB: 1946/06/17 Today's Date: 07/23/2013 Time: 4098-1191 SLP Time Calculation (min): 36 min  Assessment / Plan / Recommendation Clinical Impression  Pt. c/o of current diet, "I wouldn't give that to my dog."  Wants regular diet ( now on chopped foods due to missing upper dentition and prolonged oral phase with solids).  Pt. is preparing for d/c home, and states he can eat without his teeth.  Observed pt. chew crackers with peanut butter with increased time to chew.  Pt. required moderate cueing to stop talking while chewing, and to clear residue in oral cavity.  There were no s/s of aspiration with any consistency.  Pt's lung sounds are diminished, but clear, and he remains afebrile.    Diet Recommendation  Initiate / Change Diet: Dysphagia 3 (mechanical soft);Thin liquid    SLP Plan Discharge SLP treatment due to (comment) (d/c from hospital)   Pertinent Vitals/Pain n/a   Swallowing Goals  SLP Swallowing Goals Swallow Study Goal #1 - Progress: Met Swallow Study Goal #2 - Progress: Met  General Temperature Spikes Noted: No Respiratory Status: Supplemental O2 delivered via (comment) Behavior/Cognition: Alert;Cooperative;Pleasant mood Oral Cavity - Dentition: Adequate natural dentition;Missing dentition Patient Positioning: Upright in bed  Oral Cavity - Oral Hygiene Does patient have any of the following "at risk" factors?: Oxygen therapy - cannula, mask, simple oxygen devices;Lips - dry, cracked Brush patient's teeth BID with toothbrush (using toothpaste with fluoride): Yes Patient is HIGH RISK - Oral Care Protocol followed (see row info): Yes Patient is AT RISK - Oral Care Protocol followed (see row info): Yes   Dysphagia Treatment Treatment focused on: Skilled observation of diet tolerance;Upgraded PO texture trials;Patient/family/caregiver education Family/Caregiver Educated:  Patient Treatment Methods/Modalities: Skilled observation Patient observed directly with PO's: Yes Type of PO's observed: Regular;Thin liquids Feeding: Needs set up;Able to feed self Liquids provided via: Straw Oral Phase Signs & Symptoms: Prolonged mastication;Prolonged bolus formation;Prolonged oral phase Type of cueing: Verbal Amount of cueing: Minimal   GO     [name] T 07/23/2013, 2:29 PM

## 2013-07-23 NOTE — Progress Notes (Signed)
Discharge instructions and medications reviewed with the patient.  Follow up care reviewed with patient. States that family will take him to his appointments. Patient voices understanding to teaching.  Home Via [facility].

## 2013-09-17 ENCOUNTER — Ambulatory Visit: Payer: Self-pay | Admitting: Podiatry

## 2013-09-29 ENCOUNTER — Encounter: Payer: Self-pay | Admitting: *Deleted

## 2013-10-01 ENCOUNTER — Encounter: Payer: Medicare Other | Admitting: Cardiology

## 2013-10-23 ENCOUNTER — Ambulatory Visit: Payer: Self-pay | Admitting: Podiatry

## 2013-11-12 ENCOUNTER — Ambulatory Visit (INDEPENDENT_AMBULATORY_CARE_PROVIDER_SITE_OTHER): Payer: Medicare Other | Admitting: Podiatry

## 2013-11-12 ENCOUNTER — Encounter: Payer: Self-pay | Admitting: Podiatry

## 2013-11-12 VITALS — BP 126/90 | HR 121 | Resp 20

## 2013-11-12 DIAGNOSIS — B351 Tinea unguium: Secondary | ICD-10-CM

## 2013-11-12 DIAGNOSIS — M79609 Pain in unspecified limb: Secondary | ICD-10-CM

## 2013-11-12 NOTE — Progress Notes (Signed)
Subjective:     Patient ID: Bryce [name], male   DOB: [birth year], 67 y.o.   MRN: [id number]  HPI patient is found to have thick painful nails 1-5 both feet. He is with a caregiver is in a wheelchair and cannot take care of his nails   Review of Systems     Objective:   Physical Exam Neurovascular status unchanged with thick painful nail bed 1-5 both feet    Assessment:     Mycotic nail infection with pain 1-5 both feet    Plan:     Debridement painful nail bed 1-5 both feet with no iatrogenic bleeding noted

## 2013-11-12 NOTE — Patient Instructions (Signed)
Diabetes and Foot Care Diabetes may cause you to have problems because of poor blood supply (circulation) to your feet and legs. This may cause the skin on your feet to become thinner, break easier, and heal more slowly. Your skin may become dry, and the skin may peel and crack. You may also have nerve damage in your legs and feet causing decreased feeling in them. You may not notice minor injuries to your feet that could lead to infections or more serious problems. Taking care of your feet is one of the most important things you can do for yourself.  HOME CARE INSTRUCTIONS  Wear shoes at all times, even in the house. Do not go barefoot. Bare feet are easily injured.  Check your feet daily for blisters, cuts, and redness. If you cannot see the bottom of your feet, use a mirror or ask someone for help.  Wash your feet with warm water (do not use hot water) and mild soap. Then pat your feet and the areas between your toes until they are completely dry. Do not soak your feet as this can dry your skin.  Apply a moisturizing lotion or petroleum jelly (that does not contain alcohol and is unscented) to the skin on your feet and to dry, brittle toenails. Do not apply lotion between your toes.  Trim your toenails straight across. Do not dig under them or around the cuticle. File the edges of your nails with an emery board or nail file.  Do not cut corns or calluses or try to remove them with medicine.  Wear clean socks or stockings every day. Make sure they are not too tight. Do not wear knee-high stockings since they may decrease blood flow to your legs.  Wear shoes that fit properly and have enough cushioning. To break in new shoes, wear them for just a few hours a day. This prevents you from injuring your feet. Always look in your shoes before you put them on to be sure there are no objects inside.  Do not cross your legs. This may decrease the blood flow to your feet.  If you find a minor scrape,  cut, or break in the skin on your feet, keep it and the skin around it clean and dry. These areas may be cleansed with mild soap and water. Do not cleanse the area with peroxide, alcohol, or iodine.  When you remove an adhesive bandage, be sure not to damage the skin around it.  If you have a wound, look at it several times a day to make sure it is healing.  Do not use heating pads or hot water bottles. They may burn your skin. If you have lost feeling in your feet or legs, you may not know it is happening until it is too late.  Make sure your health care provider performs a complete foot exam at least annually or more often if you have foot problems. Report any cuts, sores, or bruises to your health care provider immediately. SEEK MEDICAL CARE IF:   You have an injury that is not healing.  You have cuts or breaks in the skin.  You have an ingrown nail.  You notice redness on your legs or feet.  You feel burning or tingling in your legs or feet.  You have pain or cramps in your legs and feet.  Your legs or feet are numb.  Your feet always feel cold. SEEK IMMEDIATE MEDICAL CARE IF:   There is increasing redness,   swelling, or pain in or around a wound.  There is a red line that goes up your leg.  Pus is coming from a wound.  You develop a fever or as directed by your health care provider.  You notice a bad smell coming from an ulcer or wound. Document Released: 11/24/2000 Document Revised: 07/30/2013 Document Reviewed: 05/06/2013 ExitCare Patient Information 2014 [employer].  

## 2013-11-13 ENCOUNTER — Encounter: Payer: Self-pay | Admitting: Cardiology

## 2014-01-29 ENCOUNTER — Encounter: Payer: Self-pay | Admitting: Cardiology

## 2014-04-06 ENCOUNTER — Other Ambulatory Visit (HOSPITAL_COMMUNITY): Payer: Self-pay | Admitting: Internal Medicine

## 2014-04-06 DIAGNOSIS — I739 Peripheral vascular disease, unspecified: Secondary | ICD-10-CM

## 2014-04-06 DIAGNOSIS — M25569 Pain in unspecified knee: Secondary | ICD-10-CM

## 2014-04-08 ENCOUNTER — Ambulatory Visit (HOSPITAL_COMMUNITY)
Admission: RE | Admit: 2014-04-08 | Discharge: 2014-04-08 | Disposition: A | Discharge status: Home / Self Care | Payer: Medicare Other | Source: Ambulatory Visit | Attending: Internal Medicine | Admitting: Internal Medicine

## 2014-04-08 DIAGNOSIS — I739 Peripheral vascular disease, unspecified: Secondary | ICD-10-CM | POA: Insufficient documentation

## 2014-04-08 DIAGNOSIS — M25569 Pain in unspecified knee: Secondary | ICD-10-CM | POA: Insufficient documentation

## 2014-04-08 DIAGNOSIS — M79609 Pain in unspecified limb: Secondary | ICD-10-CM

## 2014-04-08 NOTE — Progress Notes (Signed)
VASCULAR LAB PRELIMINARY  ARTERIAL  Technically limited due to pressure wraps on lower legs/ ankles.  ABI completed:    RIGHT    LEFT    PRESSURE WAVEFORM  PRESSURE WAVEFORM  BRACHIAL 147 Tri BRACHIAL 144 Tri  DP   DP    AT 180  Bi AT 162 Bi  PT Not audible  PT Not audible   PER   PER    GREAT TOE  NA GREAT TOE  NA    RIGHT LEFT  ABI 1.22 1.10     Farrel DemarkJill Eunice, RDMS, RVT  04/08/2014, 12:20 PM

## 2014-05-29 ENCOUNTER — Emergency Department (HOSPITAL_COMMUNITY)
Admission: EM | Admit: 2014-05-29 | Discharge: 2014-05-30 | Disposition: A | Discharge status: Home / Self Care | Payer: Medicare Other | Source: Home / Self Care | Attending: Emergency Medicine | Admitting: Emergency Medicine

## 2014-05-29 ENCOUNTER — Emergency Department (HOSPITAL_COMMUNITY): Payer: Medicare Other

## 2014-05-29 DIAGNOSIS — IMO0002 Reserved for concepts with insufficient information to code with codable children: Secondary | ICD-10-CM | POA: Insufficient documentation

## 2014-05-29 DIAGNOSIS — Z87448 Personal history of other diseases of urinary system: Secondary | ICD-10-CM

## 2014-05-29 DIAGNOSIS — I509 Heart failure, unspecified: Secondary | ICD-10-CM | POA: Diagnosis present

## 2014-05-29 DIAGNOSIS — E119 Type 2 diabetes mellitus without complications: Secondary | ICD-10-CM

## 2014-05-29 DIAGNOSIS — I452 Bifascicular block: Secondary | ICD-10-CM

## 2014-05-29 DIAGNOSIS — Z87828 Personal history of other (healed) physical injury and trauma: Secondary | ICD-10-CM

## 2014-05-29 DIAGNOSIS — I252 Old myocardial infarction: Secondary | ICD-10-CM

## 2014-05-29 DIAGNOSIS — Z79899 Other long term (current) drug therapy: Secondary | ICD-10-CM

## 2014-05-29 DIAGNOSIS — Z880 Allergy status to penicillin: Secondary | ICD-10-CM

## 2014-05-29 DIAGNOSIS — E669 Obesity, unspecified: Secondary | ICD-10-CM

## 2014-05-29 DIAGNOSIS — L8990 Pressure ulcer of unspecified site, unspecified stage: Secondary | ICD-10-CM | POA: Insufficient documentation

## 2014-05-29 DIAGNOSIS — K6289 Other specified diseases of anus and rectum: Secondary | ICD-10-CM | POA: Diagnosis present

## 2014-05-29 DIAGNOSIS — L89899 Pressure ulcer of other site, unspecified stage: Secondary | ICD-10-CM

## 2014-05-29 DIAGNOSIS — L03119 Cellulitis of unspecified part of limb: Principal | ICD-10-CM

## 2014-05-29 DIAGNOSIS — I890 Lymphedema, not elsewhere classified: Secondary | ICD-10-CM | POA: Diagnosis present

## 2014-05-29 DIAGNOSIS — F17200 Nicotine dependence, unspecified, uncomplicated: Secondary | ICD-10-CM

## 2014-05-29 DIAGNOSIS — I5032 Chronic diastolic (congestive) heart failure: Secondary | ICD-10-CM | POA: Diagnosis present

## 2014-05-29 DIAGNOSIS — Z7982 Long term (current) use of aspirin: Secondary | ICD-10-CM

## 2014-05-29 DIAGNOSIS — R195 Other fecal abnormalities: Secondary | ICD-10-CM | POA: Diagnosis present

## 2014-05-29 DIAGNOSIS — L02419 Cutaneous abscess of limb, unspecified: Principal | ICD-10-CM | POA: Diagnosis present

## 2014-05-29 DIAGNOSIS — L89309 Pressure ulcer of unspecified buttock, unspecified stage: Secondary | ICD-10-CM | POA: Insufficient documentation

## 2014-05-29 DIAGNOSIS — R609 Edema, unspecified: Secondary | ICD-10-CM | POA: Insufficient documentation

## 2014-05-29 DIAGNOSIS — I5033 Acute on chronic diastolic (congestive) heart failure: Secondary | ICD-10-CM | POA: Insufficient documentation

## 2014-05-29 DIAGNOSIS — D509 Iron deficiency anemia, unspecified: Secondary | ICD-10-CM | POA: Diagnosis present

## 2014-05-29 DIAGNOSIS — I498 Other specified cardiac arrhythmias: Secondary | ICD-10-CM | POA: Diagnosis present

## 2014-05-29 DIAGNOSIS — I10 Essential (primary) hypertension: Secondary | ICD-10-CM | POA: Diagnosis present

## 2014-05-29 DIAGNOSIS — Z6841 Body Mass Index (BMI) 40.0 and over, adult: Secondary | ICD-10-CM

## 2014-05-29 DIAGNOSIS — R062 Wheezing: Secondary | ICD-10-CM

## 2014-05-29 DIAGNOSIS — L259 Unspecified contact dermatitis, unspecified cause: Secondary | ICD-10-CM | POA: Diagnosis present

## 2014-05-29 DIAGNOSIS — L538 Other specified erythematous conditions: Secondary | ICD-10-CM | POA: Diagnosis present

## 2014-05-29 DIAGNOSIS — L97109 Non-pressure chronic ulcer of unspecified thigh with unspecified severity: Secondary | ICD-10-CM | POA: Diagnosis present

## 2014-05-29 DIAGNOSIS — Z66 Do not resuscitate: Secondary | ICD-10-CM | POA: Diagnosis present

## 2014-05-29 DIAGNOSIS — E43 Unspecified severe protein-calorie malnutrition: Secondary | ICD-10-CM | POA: Diagnosis present

## 2014-05-29 DIAGNOSIS — E876 Hypokalemia: Secondary | ICD-10-CM | POA: Diagnosis present

## 2014-05-29 LAB — PRO B NATRIURETIC PEPTIDE: Pro B Natriuretic peptide (BNP): 3056 pg/mL — ABNORMAL HIGH (ref 0–125)

## 2014-05-29 LAB — CBC WITH DIFFERENTIAL/PLATELET
BASOS PCT: 0 % (ref 0–1)
Basophils Absolute: 0 10*3/uL (ref 0.0–0.1)
EOS ABS: 0 10*3/uL (ref 0.0–0.7)
Eosinophils Relative: 0 % (ref 0–5)
HCT: 53.2 % — ABNORMAL HIGH (ref 39.0–52.0)
HEMOGLOBIN: 16.7 g/dL (ref 13.0–17.0)
LYMPHS ABS: 0.8 10*3/uL (ref 0.7–4.0)
Lymphocytes Relative: 8 % — ABNORMAL LOW (ref 12–46)
MCH: 31.1 pg (ref 26.0–34.0)
MCHC: 31.4 g/dL (ref 30.0–36.0)
MCV: 99.1 fL (ref 78.0–100.0)
MONO ABS: 1 10*3/uL (ref 0.1–1.0)
Monocytes Relative: 10 % (ref 3–12)
NEUTROS ABS: 8.3 10*3/uL — AB (ref 1.7–7.7)
NEUTROS PCT: 82 % — AB (ref 43–77)
Platelets: ADEQUATE 10*3/uL (ref 150–400)
RBC: 5.37 MIL/uL (ref 4.22–5.81)
RDW: 16.7 % — ABNORMAL HIGH (ref 11.5–15.5)
Smear Review: ADEQUATE
WBC: 10.1 10*3/uL (ref 4.0–10.5)

## 2014-05-29 LAB — I-STAT TROPONIN, ED: TROPONIN I, POC: 0.08 ng/mL (ref 0.00–0.08)

## 2014-05-29 LAB — I-STAT CHEM 8, ED
BUN: 35 mg/dL — ABNORMAL HIGH (ref 6–23)
CALCIUM ION: 0.99 mmol/L — AB (ref 1.13–1.30)
CREATININE: 1.1 mg/dL (ref 0.50–1.35)
Chloride: 97 mEq/L (ref 96–112)
GLUCOSE: 123 mg/dL — AB (ref 70–99)
HCT: 58 % — ABNORMAL HIGH (ref 39.0–52.0)
HEMOGLOBIN: 19.7 g/dL — AB (ref 13.0–17.0)
Potassium: 6.2 mEq/L — ABNORMAL HIGH (ref 3.7–5.3)
SODIUM: 140 meq/L (ref 137–147)
TCO2: 40 mmol/L (ref 0–100)

## 2014-05-29 LAB — TROPONIN I

## 2014-05-29 LAB — POTASSIUM: POTASSIUM: 4.1 meq/L (ref 3.7–5.3)

## 2014-05-29 LAB — CBG MONITORING, ED: Glucose-Capillary: 116 mg/dL — ABNORMAL HIGH (ref 70–99)

## 2014-05-29 MED ORDER — PREDNISONE 10 MG PO TABS: ORAL_TABLET | ORAL | Status: DC

## 2014-05-29 MED ORDER — ALBUTEROL SULFATE (2.5 MG/3ML) 0.083% IN NEBU
5.0000 mg | INHALATION_SOLUTION | Freq: Once | RESPIRATORY_TRACT | Status: AC
  Administered 2014-05-29: 5 mg via RESPIRATORY_TRACT
  Filled 2014-05-29: qty 6

## 2014-05-29 MED ORDER — PREDNISONE 20 MG PO TABS
40.0000 mg | ORAL_TABLET | Freq: Once | ORAL | Status: AC
  Administered 2014-05-29: 40 mg via ORAL
  Filled 2014-05-29: qty 2

## 2014-05-29 MED ORDER — SODIUM CHLORIDE 0.9 % IV BOLUS (SEPSIS): 1000.0000 mL | Freq: Once | INTRAVENOUS | Status: DC

## 2014-05-29 MED ORDER — IPRATROPIUM-ALBUTEROL 0.5-2.5 (3) MG/3ML IN SOLN
3.0000 mL | Freq: Once | RESPIRATORY_TRACT | Status: AC
Start: 2014-05-29 — End: 2014-05-29
  Administered 2014-05-29: 3 mL via RESPIRATORY_TRACT
  Filled 2014-05-29: qty 3

## 2014-05-29 MED ORDER — SODIUM CHLORIDE 0.9 % IV BOLUS (SEPSIS)
500.0000 mL | Freq: Once | INTRAVENOUS | Status: AC
  Administered 2014-05-29: 500 mL via INTRAVENOUS

## 2014-05-29 NOTE — ED Notes (Signed)
Attempted to get information from patient regarding his aide or home care service x2, patient reports aide's name is larry, states he does not remember phone number or home care service

## 2014-05-29 NOTE — ED Notes (Signed)
Wound care nurse paged  

## 2014-05-29 NOTE — ED Notes (Signed)
Spoke with Amy, case manager, who reports that [facility] will not provide ED with phone number for aide. States she sent sheriff to pt's house to see if entry is possible. States she will call back with any new information

## 2014-05-29 NOTE — ED Notes (Signed)
Notified by Noreene LarssonJill, PCT O2 sats for patient low. Pt denied SOB, poor waveform on monitor for pulse ox. Placed on NRB by tech at 15L. Pt with good wave form at 100% with new probe placed on earlobe. Pt weaned to 2L  with sats remaining at 100%

## 2014-05-29 NOTE — ED Notes (Signed)
Per [facility], pt from home, lives by himself, was on the commode with diarrhea when EMS arrived and had to clean him up. Pt has large pressure ulcer on his sacrum and one small one above it with patch still in place. Large wound is about the size of a cantaloupe per EMS. Pt urinated while at home and sprayed urine everywhere. Upon arrival to ED, pt needed to urinate and sprayed urine all over floor as well. VSS.

## 2014-05-29 NOTE — ED Notes (Signed)
Pt does not have keys to open home Pt has been calling [employer] to help with keys without success. Pt states he will continue to call. Cell phone with pt.Discharge instructions incl prescription reviewed with pt who verbalized understanding. Pt to room 28 via cart waiting for ability to open home then will be transferred home by transport

## 2014-05-29 NOTE — Discharge Instructions (Signed)
Heart Failure °Heart failure means your heart has trouble pumping blood. This makes it hard for your body to work well. Heart failure is usually a long-term (chronic) condition. You must take good care of yourself and follow your doctor's treatment plan. °HOME CARE °· Take your heart medicine as told by your doctor. °¨ Do not stop taking medicine unless your doctor tells you to. °¨ Do not skip any dose of medicine. °¨ Refill your medicines before they run out. °¨ Take other medicines only as told by your doctor or pharmacist. °· Stay active if told by your doctor. The elderly and people with severe heart failure should talk with a doctor about physical activity. °· Eat heart healthy foods. Choose foods that are without trans fat and are low in saturated fat, cholesterol, and salt (sodium). This includes fresh or frozen fruits and vegetables, fish, lean meats, fat-free or low-fat dairy foods, whole grains, and high-fiber foods. Lentils and dried peas and beans (legumes) are also good choices. °· Limit salt if told by your doctor. °· Cook in a healthy way. Roast, grill, broil, bake, poach, steam, or stir-fry foods. °· Limit fluids as told by your doctor. °· Weigh yourself every morning. Do this after you pee (urinate) and before you eat breakfast. Write down your weight to give to your doctor. °· Take your blood pressure and write it down if your doctor tell you to. °· Ask your doctor how to check your pulse. Check your pulse as told. °· Lose weight if told by your doctor. °· Stop smoking or chewing tobacco. Do not use gum or patches that help you quit without your doctor's approval. °· Schedule and go to doctor visits as told. °· Nonpregnant women should have no more than 1 drink a day. Men should have no more than 2 drinks a day. Talk to your doctor about drinking alcohol. °· Stop illegal drug use. °· Stay current with shots (immunizations). °· Manage your health conditions as told by your doctor. °· Learn to manage  your stress. °· Rest when you are tired. °· If it is really hot outside: °¨ Avoid intense activities. °¨ Use air conditioning or fans, or get in a cooler place. °¨ Avoid caffeine and alcohol. °¨ Wear loose-fitting, lightweight, and light-colored clothing. °· If it is really cold outside: °¨ Avoid intense activities. °¨ Layer your clothing. °¨ Wear mittens or gloves, a hat, and a scarf when going outside. °¨ Avoid alcohol. °· Learn about heart failure and get support as needed. °· Get help to maintain or improve your quality of life and your ability to care for yourself as needed. °GET HELP IF:  °· You gain 03 lb/1.4 kg or more in 1 day or 05 lb/2.3 kg in a week. °· You are more short of breath than usual. °· You cannot do your normal activities. °· You tire easily. °· You cough more than normal, especially with activity. °· You have any or more puffiness (swelling) in areas such as your hands, feet, ankles, or belly (abdomen). °· You cannot sleep because it is hard to breathe. °· You feel like your heart is beating fast (palpitations). °· You get dizzy or lightheaded when you stand up. °GET HELP RIGHT AWAY IF:  °· You have trouble breathing. °· There is a change in mental status, such as becoming less alert or not being able to focus. °· You have chest pain or discomfort. °· You faint. °MAKE SURE YOU:  °· Understand these   instructions.  Will watch your condition.  Will get help right away if you are not doing well or get worse. Document Released: 09/05/2008 Document Revised: 03/24/2013 Document Reviewed: 06/27/2012 [facility] Patient Information 2015 [employer] [place]. This information is not intended to replace advice given to you by your health care provider. Make sure you discuss any questions you have with your health care provider.    Take your next dose of prednisone tomorrow morning.  Make sure you are checking your blood glucose levels several times daily while on this medicine as it can elevate your  blood glucose.  Use your home oxygen and continue taking your other home medicines.  See your doctor this week, return here sooner for any worsening symptoms.

## 2014-05-29 NOTE — Progress Notes (Signed)
CSW spoke with Arline Aspindy, RN on call regional triage with [facility] who reports that she do not have any employer numbers that could give me the contact information to Wachovia CorporationLarry Hicks.  She instructed CSW to try tomorrow with the offices are open.     Maryelizabeth Rowanressa Corbett, MSW, [place]CSWA, 05/29/2014 Evening Clinical Social Worker [phone]

## 2014-05-29 NOTE — ED Notes (Signed)
Spoke with Thresa CSW, sRickard Patiencetates she got ahold of pt's aide Peyton NajjarLarry, who will be coming into ED to assist pt home

## 2014-05-29 NOTE — ED Notes (Signed)
Pt incontinence of urine, pt cleaned, bed changed.  Pt reports he needs to leave by 1700 today, reports "I'm leaving no matter what".   PA-C at bedside.

## 2014-05-29 NOTE — ED Notes (Addendum)
Spoke with case manager Amy regarding getting in contact with patient's aide, CM states that aide's name is Nadara EatonLarry Hicks. Pt uses [facility]. Will call back with available phone number.

## 2014-05-29 NOTE — Progress Notes (Signed)
  CARE MANAGEMENT ED NOTE 05/29/2014  Patient:  Bryce [name],Bryce [name]   Account Number:  [id number]  Date Initiated:  05/29/2014  Documentation initiated by:  Radford PaxFERRERO,AMY  Subjective/Objective Assessment:   Patient presents to Ed with decreased saturations, large sacral decubitis ulcers     Subjective/Objective Assessment Detail:   Patient refusing admission     Action/Plan:   Action/Plan Detail:   Anticipated DC Date:  05/29/2014     Status Recommendation to Physician:   Result of Recommendation:    Other ED Services  Consult Working Plan    DC Planning Services  CM consult  Other    Choice offered to / List presented to:            Status of service:  Completed, signed off  ED Comments:   ED Comments Detail:  [facility] but does not have a key to get into his house.  However, patient reports his aide Bryce [name] has a key.  Upon chart review, patient's aide is named Nadara EatonLarry [name], phone number provided fo Bryce [name] is disconnected.  Patient is active with [facility].  EDCM called [facility] to see if they have another phone number for Bryce [name].  EDCM also called EDSW who will have an officer check to see if anyone is at the patient's home. EDCM explained above to Valero EnergyEDRN Emily.  Awaiting phone call from [facility].
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
## 2014-05-29 NOTE — ED Notes (Signed)
Spoke with Thresa from SW, states she [facility] the [facility] call [facility] and will call back when she gets any more information

## 2014-05-29 NOTE — ED Notes (Signed)
Officer Trisha Mangleiaz and Rickard Patiencehresa found aide for patient and are Brewing technologistsending officer to [place] [employer]'s house. Found available contact number for aide. Aide will be contacting this RN after speaking with officer Nadara EatonLarry Hicks [phone]

## 2014-05-29 NOTE — ED Notes (Signed)
Spoke with Nadara EatonLarry Hicks, pt caretaker, and received key for patient's home. [facility] has been called for transport.

## 2014-05-29 NOTE — ED Provider Notes (Signed)
Patient seen and evaluated.  Hypoxic off of O2.  Bronchospastic, but clears with nebs.  Large decubitus.  BLE wounds.  Pt declines admission.  Has home O2 concentrator.  Advised to wear O2 24hours/day.  Rx for Prednisone, Albuterol.  Re check K 4.1.  Rolland PorterMark Marlowe Cinquemani, MD 05/29/14 [phone]

## 2014-05-29 NOTE — Progress Notes (Signed)
CSW called the after hours hotline for [facility] spoke with Annelle and requested a call back from the on call nurse.  CSW is currently waiting for the call back in order to get in touch with the aide.     Maryelizabeth Rowanressa Corbett, MSW, [place]ocks[place]CSWA, 05/29/2014 Evening Clinical Social Worker [phone]

## 2014-05-29 NOTE — ED Notes (Signed)
Pt unable to get in touch with his home health aid at present, home health aid has pt key to his apartment.  This RN called pt sister (emergency contact)- sister is unable to come to ED and does not have a key- she stated she will try and get in touch with pt's aid and call the ED back.

## 2014-05-29 NOTE — Progress Notes (Signed)
CSW requested the assistance from Officer Trisha MangleDiaz off duty [employer] at [facility] to help with finding the aide for this patient. The Officer reports that the Aide's name is Reola CalkinsLawrence Hicks not "Peyton NajjarLarry" and the phone number is [phone].  The Officer sent a [employer] Officer in the community to duty a safety check by the aide home and to call Lewie Loronmilie, [place]RN at [phone] with the update.  CSW called and spoke with Emilie to inform of the update and to expect the call from the Officer.     Maryelizabeth Rowanressa Ranae Casebier, MSW, [place]CSWA, 05/29/2014 Evening Clinical Social Worker [phone]

## 2014-05-29 NOTE — ED Notes (Signed)
Placed pt on 4L oxygen. Sat reading 86. Now 94. PA at bedside

## 2014-05-29 NOTE — ED Notes (Addendum)
Spoke with Social Work Rickard Patiencehresa, gave health care service and aide name. SW will call back if they have any success reaching aide

## 2014-05-30 ENCOUNTER — Encounter (HOSPITAL_COMMUNITY): Payer: Self-pay | Admitting: Emergency Medicine

## 2014-05-31 ENCOUNTER — Emergency Department (HOSPITAL_COMMUNITY): Payer: Medicare Other

## 2014-05-31 ENCOUNTER — Inpatient Hospital Stay (HOSPITAL_COMMUNITY)
Admission: AD | Admit: 2014-05-31 | Discharge: 2014-06-05 | DRG: 602 | Disposition: A | Discharge status: Skilled Nursing Facility | Payer: Medicare Other | Attending: Internal Medicine | Admitting: Internal Medicine

## 2014-05-31 ENCOUNTER — Encounter (HOSPITAL_COMMUNITY): Payer: Self-pay | Admitting: Emergency Medicine

## 2014-05-31 DIAGNOSIS — I10 Essential (primary) hypertension: Secondary | ICD-10-CM

## 2014-05-31 DIAGNOSIS — R9431 Abnormal electrocardiogram [ECG] [EKG]: Secondary | ICD-10-CM

## 2014-05-31 DIAGNOSIS — J189 Pneumonia, unspecified organism: Secondary | ICD-10-CM

## 2014-05-31 DIAGNOSIS — R0602 Shortness of breath: Secondary | ICD-10-CM

## 2014-05-31 DIAGNOSIS — E1151 Type 2 diabetes mellitus with diabetic peripheral angiopathy without gangrene: Secondary | ICD-10-CM | POA: Diagnosis present

## 2014-05-31 DIAGNOSIS — E785 Hyperlipidemia, unspecified: Secondary | ICD-10-CM

## 2014-05-31 DIAGNOSIS — I509 Heart failure, unspecified: Secondary | ICD-10-CM | POA: Diagnosis present

## 2014-05-31 DIAGNOSIS — L02419 Cutaneous abscess of limb, unspecified: Secondary | ICD-10-CM

## 2014-05-31 DIAGNOSIS — J9600 Acute respiratory failure, unspecified whether with hypoxia or hypercapnia: Secondary | ICD-10-CM

## 2014-05-31 DIAGNOSIS — L03119 Cellulitis of unspecified part of limb: Principal | ICD-10-CM

## 2014-05-31 DIAGNOSIS — I5032 Chronic diastolic (congestive) heart failure: Secondary | ICD-10-CM

## 2014-05-31 DIAGNOSIS — L988 Other specified disorders of the skin and subcutaneous tissue: Secondary | ICD-10-CM

## 2014-05-31 DIAGNOSIS — F17200 Nicotine dependence, unspecified, uncomplicated: Secondary | ICD-10-CM

## 2014-05-31 DIAGNOSIS — L304 Erythema intertrigo: Secondary | ICD-10-CM

## 2014-05-31 DIAGNOSIS — E119 Type 2 diabetes mellitus without complications: Secondary | ICD-10-CM

## 2014-05-31 LAB — COMPREHENSIVE METABOLIC PANEL
ALBUMIN: 2.4 g/dL — AB (ref 3.5–5.2)
ALK PHOS: 96 U/L (ref 39–117)
ALT: 26 U/L (ref 0–53)
AST: 20 U/L (ref 0–37)
BILIRUBIN TOTAL: 0.5 mg/dL (ref 0.3–1.2)
BUN: 21 mg/dL (ref 6–23)
CHLORIDE: 98 meq/L (ref 96–112)
CO2: 37 mEq/L — ABNORMAL HIGH (ref 19–32)
Calcium: 8.2 mg/dL — ABNORMAL LOW (ref 8.4–10.5)
Creatinine, Ser: 0.98 mg/dL (ref 0.50–1.35)
GFR calc Af Amer: >90 mL/min (ref >90)
GFR calc non Af Amer: 84 mL/min — ABNORMAL LOW (ref >90)
Glucose, Bld: 105 mg/dL — ABNORMAL HIGH (ref 70–99)
POTASSIUM: 3.1 meq/L — AB (ref 3.7–5.3)
SODIUM: 145 meq/L (ref 137–147)
Total Protein: 7 g/dL (ref 6.0–8.3)

## 2014-05-31 LAB — I-STAT TROPONIN, ED: Troponin i, poc: 0.18 ng/mL (ref 0.00–0.08)

## 2014-05-31 LAB — POC OCCULT BLOOD, ED: Fecal Occult Bld: POSITIVE — AB

## 2014-05-31 LAB — PRO B NATRIURETIC PEPTIDE: PRO B NATRI PEPTIDE: 3281 pg/mL — AB (ref 0–125)

## 2014-05-31 LAB — URINALYSIS, ROUTINE W REFLEX MICROSCOPIC
Bilirubin Urine: NEGATIVE
GLUCOSE, UA: NEGATIVE mg/dL
Hgb urine dipstick: NEGATIVE
Ketones, ur: NEGATIVE mg/dL
LEUKOCYTES UA: NEGATIVE
NITRITE: NEGATIVE
PH: 5.5 (ref 5.0–8.0)
Protein, ur: NEGATIVE mg/dL
SPECIFIC GRAVITY, URINE: 1.008 (ref 1.005–1.030)
Urobilinogen, UA: 1 mg/dL (ref 0.0–1.0)

## 2014-05-31 LAB — CBC
HEMATOCRIT: 52.1 % — AB (ref 39.0–52.0)
Hemoglobin: 15.8 g/dL (ref 13.0–17.0)
MCH: 30.4 pg (ref 26.0–34.0)
MCHC: 30.3 g/dL (ref 30.0–36.0)
MCV: 100.2 fL — ABNORMAL HIGH (ref 78.0–100.0)
Platelets: 244 10*3/uL (ref 150–400)
RBC: 5.2 MIL/uL (ref 4.22–5.81)
RDW: 16.2 % — AB (ref 11.5–15.5)
WBC: 8.8 10*3/uL (ref 4.0–10.5)

## 2014-05-31 LAB — I-STAT CG4 LACTIC ACID, ED: Lactic Acid, Venous: 1.4 mmol/L (ref 0.5–2.2)

## 2014-05-31 MED ORDER — VITAMIN D3 25 MCG (1000 UNIT) PO TABS
2000.0000 [IU] | ORAL_TABLET | Freq: Every morning | ORAL | Status: DC
  Administered 2014-06-01 – 2014-06-04 (×4): 2000 [IU] via ORAL
  Filled 2014-05-31 (×5): qty 2

## 2014-05-31 MED ORDER — CLONIDINE HCL 0.2 MG PO TABS
0.2000 mg | ORAL_TABLET | Freq: Two times a day (BID) | ORAL | Status: DC
  Administered 2014-06-01 – 2014-06-04 (×7): 0.2 mg via ORAL
  Filled 2014-05-31 (×11): qty 1

## 2014-05-31 MED ORDER — AMLODIPINE BESYLATE 10 MG PO TABS
10.0000 mg | ORAL_TABLET | Freq: Every morning | ORAL | Status: DC
  Administered 2014-06-01 – 2014-06-04 (×4): 10 mg via ORAL
  Filled 2014-05-31 (×5): qty 1

## 2014-05-31 MED ORDER — HEPARIN SODIUM (PORCINE) 5000 UNIT/ML IJ SOLN
5000.0000 [IU] | Freq: Three times a day (TID) | INTRAMUSCULAR | Status: DC
  Filled 2014-05-31 (×4): qty 1

## 2014-05-31 MED ORDER — PANTOPRAZOLE SODIUM 40 MG PO TBEC
40.0000 mg | DELAYED_RELEASE_TABLET | Freq: Every day | ORAL | Status: DC
  Administered 2014-06-01 – 2014-06-04 (×4): 40 mg via ORAL
  Filled 2014-05-31 (×5): qty 1

## 2014-05-31 MED ORDER — IPRATROPIUM-ALBUTEROL 18-103 MCG/ACT IN AERO: 1.0000 | INHALATION_SPRAY | Freq: Every morning | RESPIRATORY_TRACT | Status: DC

## 2014-05-31 MED ORDER — TRAMADOL HCL 50 MG PO TABS
50.0000 mg | ORAL_TABLET | Freq: Three times a day (TID) | ORAL | Status: DC | PRN
  Administered 2014-06-01 – 2014-06-03 (×4): 100 mg via ORAL
  Filled 2014-05-31 (×5): qty 2

## 2014-05-31 MED ORDER — FERROUS SULFATE 325 (65 FE) MG PO TABS
650.0000 mg | ORAL_TABLET | Freq: Every day | ORAL | Status: DC
  Administered 2014-06-01 – 2014-06-04 (×4): 650 mg via ORAL
  Filled 2014-05-31 (×7): qty 2

## 2014-05-31 MED ORDER — SODIUM CHLORIDE 0.9 % IJ SOLN
3.0000 mL | Freq: Two times a day (BID) | INTRAMUSCULAR | Status: DC
  Administered 2014-06-01 – 2014-06-03 (×4): 3 mL via INTRAVENOUS

## 2014-05-31 MED ORDER — ASPIRIN 325 MG PO TABS
325.0000 mg | ORAL_TABLET | Freq: Once | ORAL | Status: AC
  Administered 2014-05-31: 325 mg via ORAL
  Filled 2014-05-31: qty 1

## 2014-05-31 MED ORDER — ASPIRIN EC 81 MG PO TBEC
81.0000 mg | DELAYED_RELEASE_TABLET | Freq: Every day | ORAL | Status: DC
  Administered 2014-06-01 – 2014-06-04 (×4): 81 mg via ORAL
  Filled 2014-05-31 (×5): qty 1

## 2014-05-31 MED ORDER — SODIUM CHLORIDE 0.9 % IJ SOLN: 3.0000 mL | INTRAMUSCULAR | Status: DC | PRN

## 2014-05-31 MED ORDER — LEVOFLOXACIN IN D5W 750 MG/150ML IV SOLN
750.0000 mg | INTRAVENOUS | Status: DC
  Administered 2014-06-01 – 2014-06-04 (×5): 750 mg via INTRAVENOUS
  Filled 2014-05-31 (×5): qty 150

## 2014-05-31 MED ORDER — SODIUM CHLORIDE 0.9 % IV SOLN: 250.0000 mL | INTRAVENOUS | Status: DC | PRN

## 2014-05-31 MED ORDER — SPIRONOLACTONE 12.5 MG HALF TABLET
12.5000 mg | ORAL_TABLET | Freq: Every morning | ORAL | Status: DC
  Administered 2014-06-01 – 2014-06-04 (×4): 12.5 mg via ORAL
  Filled 2014-05-31 (×5): qty 1

## 2014-05-31 MED ORDER — SIMVASTATIN 5 MG PO TABS
5.0000 mg | ORAL_TABLET | Freq: Every day | ORAL | Status: DC
  Administered 2014-06-01 – 2014-06-04 (×4): 5 mg via ORAL
  Filled 2014-05-31 (×5): qty 1

## 2014-05-31 MED ORDER — FUROSEMIDE 40 MG PO TABS
40.0000 mg | ORAL_TABLET | Freq: Two times a day (BID) | ORAL | Status: DC
Start: 2014-06-01 — End: 2014-06-01
  Administered 2014-06-01: 40 mg via ORAL
  Filled 2014-05-31 (×4): qty 1

## 2014-05-31 MED ORDER — VITAMIN B-1 100 MG PO TABS
100.0000 mg | ORAL_TABLET | Freq: Every morning | ORAL | Status: DC
  Administered 2014-06-01 – 2014-06-04 (×4): 100 mg via ORAL
  Filled 2014-05-31 (×5): qty 1

## 2014-05-31 MED ORDER — POTASSIUM CHLORIDE CRYS ER 10 MEQ PO TBCR
10.0000 meq | EXTENDED_RELEASE_TABLET | Freq: Two times a day (BID) | ORAL | Status: DC
  Administered 2014-06-01: 10 meq via ORAL
  Filled 2014-05-31 (×3): qty 1

## 2014-05-31 MED ORDER — FOLIC ACID 1 MG PO TABS
1.0000 mg | ORAL_TABLET | Freq: Every morning | ORAL | Status: DC
  Administered 2014-06-01 – 2014-06-04 (×4): 1 mg via ORAL
  Filled 2014-05-31 (×5): qty 1

## 2014-05-31 NOTE — ED Notes (Signed)
Bed: [id number] Expected date:  Expected time:  Means of arrival:  Comments: EMS/rectal bleeding

## 2014-05-31 NOTE — ED Notes (Signed)
MD at bedside. 
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
## 2014-05-31 NOTE — ED Notes (Signed)
Pt BIB EMS. Pt is wheelchair bound and has hx of hemorrhoids. Pt states he has had intermittent bleeding from rectum for the past week. Pt also states he is normally able to stand but has become weaker lately and now is unable to stand. Pt alert, no acute distress. Pt arrives with coban wrapped dressing to bilateral lower legs. Pt states he is having rectal pain also.

## 2014-05-31 NOTE — ED Notes (Signed)
Critical troponin result given to Dr Gwendolyn GrantWalden

## 2014-05-31 NOTE — ED Notes (Signed)
Pt once again refuses IV access. States "I don't do that."

## 2014-06-01 ENCOUNTER — Encounter (HOSPITAL_COMMUNITY): Payer: Self-pay | Admitting: *Deleted

## 2014-06-01 DIAGNOSIS — I5032 Chronic diastolic (congestive) heart failure: Secondary | ICD-10-CM

## 2014-06-01 DIAGNOSIS — I369 Nonrheumatic tricuspid valve disorder, unspecified: Secondary | ICD-10-CM

## 2014-06-01 DIAGNOSIS — L988 Other specified disorders of the skin and subcutaneous tissue: Secondary | ICD-10-CM

## 2014-06-01 LAB — GLUCOSE, CAPILLARY
GLUCOSE-CAPILLARY: 122 mg/dL — AB (ref 70–99)
Glucose-Capillary: 108 mg/dL — ABNORMAL HIGH (ref 70–99)
Glucose-Capillary: 137 mg/dL — ABNORMAL HIGH (ref 70–99)

## 2014-06-01 LAB — BASIC METABOLIC PANEL
BUN: 15 mg/dL (ref 6–23)
CALCIUM: 7.7 mg/dL — AB (ref 8.4–10.5)
CO2: 40 mEq/L (ref 19–32)
Chloride: 99 mEq/L (ref 96–112)
Creatinine, Ser: 0.82 mg/dL (ref 0.50–1.35)
Glucose, Bld: 110 mg/dL — ABNORMAL HIGH (ref 70–99)
POTASSIUM: 3.2 meq/L — AB (ref 3.7–5.3)
SODIUM: 146 meq/L (ref 137–147)

## 2014-06-01 LAB — MAGNESIUM: Magnesium: 1.2 mg/dL — ABNORMAL LOW (ref 1.5–2.5)

## 2014-06-01 LAB — TSH: TSH: 1.47 u[IU]/mL (ref 0.350–4.500)

## 2014-06-01 LAB — TROPONIN I
Troponin I: <0.3 ng/mL (ref <0.30)
Troponin I: <0.3 ng/mL (ref <0.30)

## 2014-06-01 MED ORDER — VANCOMYCIN HCL 10 G IV SOLR
2500.0000 mg | Freq: Once | INTRAVENOUS | Status: AC
  Administered 2014-06-01: 2500 mg via INTRAVENOUS
  Filled 2014-06-01: qty 2500

## 2014-06-01 MED ORDER — JUVEN PO PACK
1.0000 | PACK | Freq: Every morning | ORAL | Status: DC
  Administered 2014-06-02 – 2014-06-04 (×3): 1 via ORAL
  Filled 2014-06-01 (×4): qty 1

## 2014-06-01 MED ORDER — IPRATROPIUM-ALBUTEROL 0.5-2.5 (3) MG/3ML IN SOLN
3.0000 mL | Freq: Every day | RESPIRATORY_TRACT | Status: DC
  Administered 2014-06-01 – 2014-06-05 (×5): 3 mL via RESPIRATORY_TRACT
  Filled 2014-06-01 (×5): qty 3

## 2014-06-01 MED ORDER — BIOTENE DRY MOUTH MT LIQD
15.0000 mL | Freq: Two times a day (BID) | OROMUCOSAL | Status: DC
  Administered 2014-06-01 – 2014-06-04 (×6): 15 mL via OROMUCOSAL

## 2014-06-01 MED ORDER — SODIUM CHLORIDE 0.9 % IV SOLN
INTRAVENOUS | Status: DC
  Administered 2014-06-01: 02:00:00 via INTRAVENOUS

## 2014-06-01 MED ORDER — PRO-STAT SUGAR FREE PO LIQD
30.0000 mL | Freq: Two times a day (BID) | ORAL | Status: DC
  Administered 2014-06-01 – 2014-06-04 (×6): 30 mL via ORAL
  Filled 2014-06-01 (×12): qty 30

## 2014-06-01 MED ORDER — POTASSIUM CHLORIDE CRYS ER 20 MEQ PO TBCR
40.0000 meq | EXTENDED_RELEASE_TABLET | Freq: Two times a day (BID) | ORAL | Status: AC
  Administered 2014-06-01 – 2014-06-02 (×4): 40 meq via ORAL
  Filled 2014-06-01 (×4): qty 2

## 2014-06-01 MED ORDER — VANCOMYCIN HCL 10 G IV SOLR
1500.0000 mg | Freq: Two times a day (BID) | INTRAVENOUS | Status: DC
  Administered 2014-06-02 – 2014-06-03 (×3): 1500 mg via INTRAVENOUS
  Filled 2014-06-01 (×4): qty 1500

## 2014-06-01 MED ORDER — INSULIN ASPART 100 UNIT/ML ~~LOC~~ SOLN
0.0000 [IU] | Freq: Three times a day (TID) | SUBCUTANEOUS | Status: DC
  Administered 2014-06-01 – 2014-06-02 (×3): 1 [IU] via SUBCUTANEOUS
  Administered 2014-06-02: 2 [IU] via SUBCUTANEOUS
  Administered 2014-06-02: 1 [IU] via SUBCUTANEOUS
  Administered 2014-06-03 (×2): 2 [IU] via SUBCUTANEOUS
  Administered 2014-06-03: 1 [IU] via SUBCUTANEOUS
  Administered 2014-06-04: 2 [IU] via SUBCUTANEOUS

## 2014-06-01 MED ORDER — FUROSEMIDE 40 MG PO TABS
40.0000 mg | ORAL_TABLET | Freq: Every day | ORAL | Status: DC
Start: 2014-06-02 — End: 2014-06-05
  Administered 2014-06-02 – 2014-06-04 (×3): 40 mg via ORAL
  Filled 2014-06-01 (×4): qty 1

## 2014-06-01 NOTE — Progress Notes (Addendum)
Patient Bryce [name]:Garry Vesta Mixer Sterling      DOB: 12/10/1946      VWU:[id number]  Discussed sacral wounds with nurse who received patient.  Patient unable to be fully examined on narrow gurney in ED.  Evaluation by nursing reveals active oozing from perirectal lesion, unclear if this is mass.  He refers to it as a hemorrhoid.  Will cancel heparin due to active bleeding and request evaluation as soon as possible in the am by wound team. Patient refused internal catheter placement.  He is incontinent of stool. Wound care per nursing protcol tonight, legs will also need to be evaluated under uniboot.  Patient unable to tell me his medications.  Spoke with pharmacy tech.  Meds were reconciled with caregiver at home two days ago.  Current, med rec references this evaluation as no care givers available.  Will ask primary team to re-reconcile in the am as care givers become available.   Melissa L. Ladona Ridgelaylor, MD MBA The Palliative Medicine Team at [facility] Team Phone: [phone] Pager: [phone]

## 2014-06-01 NOTE — Progress Notes (Signed)
CRITICAL VALUE ALERT  Critical value received:  CO2  Date of notification:  06/01/14  Time of notification:  0857  Critical value read back:yes  Nurse who received alert:  Kathee DeltonJulia Christophr Calix RN  MD notified (1st page):  [place]  Time of first page:  0906  MD notified (2nd page):  Time of second page:  Responding MD:  Sharl MaLama  Time MD responded:  [phone]

## 2014-06-01 NOTE — Progress Notes (Signed)
Pt incontinent in ER dept & on arrival to unit, the pt had to be bathed, & cleaned & wound care given due to active bleeding from Rt buttock wound, trickling blood, Dr Ladona Ridgelaylor just made aware at 2:35am so she is going to stop Heparin for now. Wet to dry dsg's done to all buttock wounds & Allevyn to Rt thigh wound after cleansing well, that site also bloody but not dripping blood at this time. Dr Ladona Ridgelaylor also made aware of pt's refusal of Foley cath insertion, & his poor historian skill, which contradicts her note on pt in ER dept earlier. Wound care given again due to soilage from incontinent stool, & it takes 5 or more people to do this care, very time consuming & difficult for all involved even the patient was uncomfortable.

## 2014-06-01 NOTE — Progress Notes (Signed)
  Echocardiogram 2D Echocardiogram has been performed.  CHUNG, KWONG 06/01/2014, 11:30 AM

## 2014-06-01 NOTE — Care Management Note (Addendum)
    Page 1 of 1   06/05/2014     11:32:05 AM CARE MANAGEMENT NOTE 06/05/2014  Patient:  Bryce [name],Bryce [name]   Account Number:  [id number]  Date Initiated:  06/01/2014  Documentation initiated by:  Lanier ClamMAHABIR,KATHY  Subjective/Objective Assessment:   68 Y/O M ADMITTED W/CHF.     Action/Plan:   FROM HOME.ACTIVE W/[facility]-RN,[facility] DME-HOSPITAL BED,02,NEB MACHINE,W/C,RW,TUB TRANSFER BENCH.   Anticipated DC Date:  06/05/2014   Anticipated DC Plan:  SKILLED NURSING FACILITY      DC Planning Services  CM consult      Choice offered to / List presented to:             Status of service:  Completed, signed off Medicare Important Message given?  YES (If response is "NO", the following Medicare IM given date fields will be blank) Date Medicare IM given:  06/04/2014 Date Additional Medicare IM given:    Discharge Disposition:  SKILLED NURSING FACILITY  Per UR Regulation:  Reviewed for med. necessity/level of care/duration of stay  If discussed at Long Length of Stay Meetings, dates discussed:    Comments:  06/05/14 KATHY MAHABIR RN,BSN NCM [phone] I WAS NOTIFIED THAT PATIENT WANTS HOME W/HOME HEALTH CARE.AFTER TALKING TO PATIENT W/DR. MYERS/SW/PATIENT FRIEND-LARRY ALL WITNESSED PATIENT AGREEING TO SNF.SW CONTACTED PATIENT'S SISTER BETTY ON SPEAKER PHONE & INFORMED OF CHOICES FOR SNF,PATIENT &  SISTER AGREED TO [facility].  06/04/14 KATHY MAHABIR RN,BSN NCM [phone] NOW AGREE TO SNF.SW FOLLOWING.IV ABX,IV LASIX,WOUND CARE.  06/02/14 KATHY MAHABIR RN,BSN NCM [phone] PATIENT DECLINES SNF, BUT MAY NEED AMBULANCE TRANSP IF UNABLE TO GET FAMILY TO TRANSP @ D/C.INFORMED [facility] DME REP [place] OF CONCERNS ABOUT HOME HOSPITAL BED.  06/01/14 KATHY MAHABIR RN,BSN NCM [phone] TC [facility] REP KRISTIN CONFIRMED ACTIVE W/[facility]HC/DME.D/C PLAN RETURN HOME.AWAIT FINAL HHC ORDERS.

## 2014-06-01 NOTE — Progress Notes (Signed)
[facility] PROGRESS NOTE  Bryce [name] WUJ:[id number] DOB: 08/25/1946 DOA: 05/31/2014 PCP: Dorrene GermanAVBUERE,EDWIN A, MD  Interval history 68 y.o. male very complex medical patient. He has been seen in ED this week but signed out AMA. He returns reporting he is having trouble with is hemorrhoids but in reality he has significant lower extremity cellulitis, and maceration of the skin on his thighs, per ed report buttocks and perrectal area. His penis is also cellulitic but does not appear to be gangrenous or abscessed. The patient had slight CHF during last evening admission and again tonight. He is threatening to leave but then says he will stay till the weekend. He is refusing an IV despite my efforts and the ED attendings efforts . He agrees to take pills but will not permit IV to be placed. We are in a difficult situation as the patient appears to have some capacity for decision making in that he understands that if he does not have CPR or use a life support machine in an emergency situation that he will die. He states "we all will die and I have been through that I do not want it." He , however has very limited insight into how sick he is right now and will not permit proper treatment    Assessment/Plan: 1. Cellulitis and chronic wounds of legs,, buttocks- patient has been started on IV Levaquin, will also add vancomycin per pharmacy consultation. Wound care has been consulted for further recommendations. 2. Guaiac-positive stools-patient's hemoglobin is stable at 15.8/52.1, not sure whether guaiac-positive stools versus bleeding from the perirectal area mixing with stool. We'll continue to monitor the H&H. I do not see any colonoscopy done in the past. His hemoglobin starts to drop we'll consider GI consultation. 3. Congestive heart failure- denies shortness of breath at this time, we'll continue with the home dose of Lasix 40 mg by mouth daily. Continue Aldactone 12.5 mg daily 4. Diabetes  mellitus-we'll start sliding scale insulin with NovoLog, continue to hold oral hypoglycemics. 5. Hypertension-continue Catapres 0.2 twice a day, amlodipine 10 mg daily, Aldactone 12.5 mg daily. 6. Iron deficiency anemia-continue ferrous sulfate 325 mg by mouth daily  Code Status: Full code Family Communication: *Called and discussed with patient's sister at home Disposition Plan: To be decided   Consultants:  None  Procedures:  None  Antibiotics:  None (indicate start date, and stop date if known)  HPI/Subjective: Patient seen and examined, admitted with rectal pain. Patient came with hemorrhoidal bleeding, I do not see any colonoscopy in the epic.Stool guaiac was found positive. As per ED notes patient does have perirectal wound.  Objective: Filed Vitals:   06/01/14 0554  BP: 115/65  Pulse: 101  Temp: 98.5 F (36.9 C)  Resp: 20    Intake/Output Summary (Last 24 hours) at 06/01/14 0958 Last data filed at 06/01/14 0925  Gross per 24 hour  Intake  786.5 ml  Output   1000 ml  Net -213.5 ml   Filed Weights   06/01/14 0008  Weight: 142.429 kg (314 lb)    Exam:  Physical Exam: Head: Normocephalic, atraumatic.  Lungs: Normal respiratory effort. B/L Clear to auscultation, no crackles or wheezes.  Heart: Regular RR. S1 and S2 normal  Abdomen: BS normoactive. Soft, Nondistended, non-tender.  Buttocks-macerated skin with oozing of serosanguineous discharge noted in the perirectal area. Extremities: Chronic brawny induration noted, lower extremity dressing,    Data Reviewed: Basic Metabolic Panel:  Recent Labs Lab 05/29/14 1220 05/29/14 1240 05/31/14 1718 06/01/14  0757  NA 140  --  145 146  K 6.2* 4.1 3.1* 3.2*  CL 97  --  98 99  CO2  --   --  37* 40*  GLUCOSE 123*  --  105* 110*  BUN 35*  --  21 15  CREATININE 1.10  --  0.98 0.82  CALCIUM  --   --  8.2* 7.7*  MG  --   --   --  1.2*   Liver Function Tests:  Recent Labs Lab 05/31/14 1718  AST 20   ALT 26  ALKPHOS 96  BILITOT 0.5  PROT 7.0  ALBUMIN 2.4*   No results found for this basename: LIPASE, AMYLASE,  in the last 168 hours No results found for this basename: AMMONIA,  in the last 168 hours CBC:  Recent Labs Lab 05/29/14 1205 05/29/14 1220 05/31/14 1718  WBC 10.1  --  8.8  NEUTROABS 8.3*  --   --   HGB 16.7 19.7* 15.8  HCT 53.2* 58.0* 52.1*  MCV 99.1  --  100.2*  PLT PLATELET CLUMPS NOTED ON SMEAR, COUNT APPEARS ADEQUATE  --  244   Cardiac Enzymes:  Recent Labs Lab 05/29/14 1240 06/01/14 0019 06/01/14 0730  TROPONINI <0.30 <0.30 <0.30   BNP (last 3 results)  Recent Labs  07/13/13 1620 05/29/14 1206 05/31/14 1718  PROBNP 1489.0* 3056.0* 3281.0*   CBG:  Recent Labs Lab 05/29/14 1048 06/01/14 0734  GLUCAP 116* 108*    Recent Results (from the past 240 hour(s))  CULTURE, BLOOD (ROUTINE X 2)     Status: None   Collection Time    05/29/14 11:52 AM      Result Value Ref Range Status   Specimen Description BLOOD LEFT ARM   Final   Special Requests BOTTLES DRAWN AEROBIC ONLY 5CC   Final   Culture  Setup Time     Final   Value: 05/29/2014 17:42     Performed at [facility]   Culture     Final   Value:        BLOOD CULTURE RECEIVED NO GROWTH TO DATE CULTURE WILL BE HELD FOR 5 DAYS BEFORE ISSUING A FINAL NEGATIVE REPORT     Performed at [facility]   Report Status PENDING   Incomplete  CULTURE, BLOOD (ROUTINE X 2)     Status: None   Collection Time    05/29/14 12:56 PM      Result Value Ref Range Status   Specimen Description BLOOD LEFT ANTECUBITAL   Final   Special Requests BOTTLES DRAWN AEROBIC AND ANAEROBIC 10CCS   Final   Culture  Setup Time     Final   Value: 05/29/2014 17:43     Performed at [facility]   Culture     Final   Value:        BLOOD CULTURE RECEIVED NO GROWTH TO DATE CULTURE WILL BE HELD FOR 5 DAYS BEFORE ISSUING A FINAL NEGATIVE REPORT     Performed at [facility]   Report Status  PENDING   Incomplete     Studies: Dg Chest 2 View  05/31/2014   CLINICAL DATA:  Weakness, shortness of breath  EXAM: CHEST  2 VIEW  COMPARISON:  05/29/2014  FINDINGS: Stable cardiomegaly. No pleural effusion or pneumothorax. Bilateral interstitial thickening and prominence of the central pulmonary vasculature. Unremarkable osseous structures.  IMPRESSION: Findings concerning for mild CHF.   Electronically Signed   By: Elige KoHetal  Patel  On: 05/31/2014 19:01    Scheduled Meds: . amLODipine  10 mg Oral q morning - 10a  . antiseptic oral rinse  15 mL Mouth Rinse BID  . aspirin EC  81 mg Oral Daily  . cholecalciferol  2,000 Units Oral q morning - 10a  . cloNIDine  0.2 mg Oral BID  . ferrous sulfate  650 mg Oral QPC breakfast  . folic acid  1 mg Oral q morning - 10a  . [START ON 06/02/2014] furosemide  40 mg Oral Daily  . ipratropium-albuterol  3 mL Nebulization Daily  . levofloxacin (LEVAQUIN) IV  750 mg Intravenous Q24H  . pantoprazole  40 mg Oral Daily  . potassium chloride  40 mEq Oral BID  . simvastatin  5 mg Oral q1800  . sodium chloride  3 mL Intravenous Q12H  . spironolactone  12.5 mg Oral q morning - 10a  . thiamine  100 mg Oral q morning - 10a   Continuous Infusions: . sodium chloride 10 mL/hr at 06/01/14 0139    Active Problems:   DM   CHF (congestive heart failure)   Cellulitis and abscess of leg   Heart failure    Time spent: 35 minutes    [facility]  [facility] Pager [phone]*. If 7PM-7AM, please contact night-coverage at [email or web address], password [facility] 06/01/2014, 9:58 AM  LOS: 1 day

## 2014-06-01 NOTE — Progress Notes (Signed)
[facility]  Patient Status: Active (receiving services up to time of hospitalization)  [facility] is providing the following services: RN  If patient discharges after hours, please call [phone].   Lanae CrumblyKristen Hayworth 06/01/2014, 11:31 AM

## 2014-06-01 NOTE — Consult Note (Signed)
WOC wound consult note Reason for Consult: Numerous skin issues on bilateral LEs (wearing Unna's Boots) and in perineum secondary to moisture. Also noted is some ITD (intertriginous dermatitis). Combination ulcer on right posterior thigh from moisture and pressure Wound type:Moisture associated skin damage, intertriginous skin damage and venous insufficiency versus lymphedema Pressure Ulcer POA: Yes Measurement:Right posterior thigh measuring 9cm x 6cm and 0.2cm.  60% pink, 40% stringy yellow slough.  Perineum:  Numerous partial thickness and scattered open areas with deeper lesions in the creases from pooling urine and perhaps stool.  Hypertrophic areas that have developed as a result of prolonged exposure to urine and stool are particularly friable and bleed easily when cleansed.  Intertriginous dermatitis with ulcerations (full thickness) at the pannus in an area measuring 6cm x 3cm  with the largest open area measuring 1cm x 1.5cm x 0.2cm oval. Bilateral LEs with open areas: the left with an ulcer at the posterior aspect (Achilles Area) measuring 2cm x 1.5cm x 0.2cm and the right with an area of hypertrophic tissue measuring 10cm x 20cm that is weeping serous exudate. Wound bed:As described above Drainage (amount, consistency, odor) As described above Periwound:As described above Dressing procedure/placement/frequency: I have requested ortho tech to re-apply Unna's boots although I suspect his etiology here is more lymphedema than venous insufficiency.  He will likely need more than one per extremity to properly address his size. I will use saline dressings on the right posterior thigh and in the deeper crevices in the perineum, topical zinc-oxide based skin protective cream to the scattered open areas on the buttocks due to moisture.  He is on a therapeutic mattress with low air loss feature and will be turned and repositioned per protocol. I will provide pressure redistribution heel boots to prevent  pressure ulcerations. I understand that he has come to us from home; I will submit that a higher level of care, at least from the integumentary perspective, is indicated. WOC nursing team will not follow, but will remain available to this patient, the nursing and medical teams.  Please re-consult if needed. Thanks, Ladona MowLaurie Louise Rawson, MSN, RN, GNP, [place], CWON-AP [phone])
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
## 2014-06-01 NOTE — Plan of Care (Signed)
Problem: Consults Goal: Heart Failure Patient Education (See Patient Education module for education specifics.) Outcome: Progressing Packet given but to sleepy to have education at this time Goal: Skin Care Protocol Initiated - if Braden Score 18 or less If consults are not indicated, leave blank or document N/A Outcome: Not Met (add Reason) Many wounds

## 2014-06-02 LAB — CBC
HEMATOCRIT: 48.5 % (ref 39.0–52.0)
Hemoglobin: 13.6 g/dL (ref 13.0–17.0)
MCH: 29.6 pg (ref 26.0–34.0)
MCHC: 28 g/dL — AB (ref 30.0–36.0)
MCV: 105.7 fL — ABNORMAL HIGH (ref 78.0–100.0)
PLATELETS: 220 10*3/uL (ref 150–400)
RBC: 4.59 MIL/uL (ref 4.22–5.81)
RDW: 16 % — AB (ref 11.5–15.5)
WBC: 6.6 10*3/uL (ref 4.0–10.5)

## 2014-06-02 LAB — GLUCOSE, CAPILLARY
GLUCOSE-CAPILLARY: 124 mg/dL — AB (ref 70–99)
GLUCOSE-CAPILLARY: 136 mg/dL — AB (ref 70–99)
GLUCOSE-CAPILLARY: 176 mg/dL — AB (ref 70–99)
Glucose-Capillary: 125 mg/dL — ABNORMAL HIGH (ref 70–99)
Glucose-Capillary: 122 mg/dL — ABNORMAL HIGH (ref 70–99)

## 2014-06-02 LAB — BASIC METABOLIC PANEL
BUN: 14 mg/dL (ref 6–23)
CO2: 39 meq/L — AB (ref 19–32)
Calcium: 7.8 mg/dL — ABNORMAL LOW (ref 8.4–10.5)
Chloride: 97 mEq/L (ref 96–112)
Creatinine, Ser: 0.88 mg/dL (ref 0.50–1.35)
GFR calc Af Amer: >90 mL/min (ref >90)
GFR calc non Af Amer: 88 mL/min — ABNORMAL LOW (ref >90)
GLUCOSE: 139 mg/dL — AB (ref 70–99)
POTASSIUM: 3.6 meq/L — AB (ref 3.7–5.3)
Sodium: 140 mEq/L (ref 137–147)

## 2014-06-02 NOTE — Progress Notes (Signed)
[facility] PROGRESS NOTE  Bryce [name] ZOX:[id number] DOB: [birth year] DOA: 05/31/2014 PCP: Dorrene German, MD  Interval history 68 y.o. male very complex medical patient. He has been seen in ED this week but signed out AMA. He returns reporting he is having trouble with is hemorrhoids but in reality he has significant lower extremity cellulitis, and maceration of the skin on his thighs, per ed report buttocks and perrectal area. His penis is also cellulitic but does not appear to be gangrenous or abscessed. The patient had slight CHF during last evening admission and again tonight. He is threatening to leave but then says he will stay till the weekend. He is refusing an IV despite my efforts and the ED attendings efforts . He agrees to take pills but will not permit IV to be placed. We are in a difficult situation as the patient appears to have some capacity for decision making in that he understands that if he does not have CPR or use a life support machine in an emergency situation that he will die. He states "we all will die and I have been through that I do not want it." He , however has very limited insight into how sick he is right now and will not permit proper treatment    Assessment/Plan: 1. Cellulitis and chronic wounds of legs,, buttocks- patient has been started on IV Levaquin, will also add vancomycin per pharmacy consultation. Wound care has seen the patient. Patient has significant perirectal wounds. 2. Guaiac-positive stools-patient has numerous open areas on the buttocks and premium, which could have been the source of guaiac-positive stools. patient's hemoglobin is stable at 15.8/52.1, will check CBC today not sure whether guaiac-positive stools versus bleeding from the perirectal area mixing with stool. We'll continue to monitor the H&H. I do not see any colonoscopy done in the past. If his hemoglobin starts to drop we'll consider GI consultation. 3. Congestive heart  failure- denies shortness of breath at this time, we'll continue with the home dose of Lasix 40 mg by mouth daily. Continue Aldactone 12.5 mg daily 4. Diabetes mellitus-we'll start sliding scale insulin with NovoLog, continue to hold oral hypoglycemics. 5. Hypertension-continue Catapres 0.2 twice a day, amlodipine 10 mg daily, Aldactone 12.5 mg daily. 6. Iron deficiency anemia-continue ferrous sulfate 325 mg by mouth daily  Code Status: Full code Family Communication: *Called and discussed with patient's sister at home Disposition Plan: Home with home health   Consultants:  None  Procedures:  None  Antibiotics:  None (indicate start date, and stop date if known)  HPI/Subjective: Patient seen and examined, admitted with rectal pain. Patient came with hemorrhoidal bleeding, I do not see any colonoscopy in the epic.Stool guaiac was found positive. As per ED notes patient does have perirectal wound. Patient feels better this morning, wants to try regular food.  Objective: Filed Vitals:   06/02/14 1412  BP: 103/72  Pulse: 103  Temp: 97.4 F (36.3 C)  Resp: 18    Intake/Output Summary (Last 24 hours) at 06/02/14 1556 Last data filed at 06/02/14 1412  Gross per 24 hour  Intake   1050 ml  Output    950 ml  Net    100 ml   Filed Weights   06/01/14 0008 06/02/14 0403  Weight: 142.429 kg (314 lb) 121.564 kg (268 lb)    Exam:  Physical Exam: Head: Normocephalic, atraumatic.  Lungs: Normal respiratory effort. B/L Clear to auscultation, no crackles or wheezes.  Heart: Regular RR. S1 and  S2 normal  Abdomen: BS normoactive. Soft, Nondistended, non-tender.  Buttocks-macerated skin with oozing of serosanguineous discharge noted in the perirectal area. Extremities: Chronic brawny induration noted, lower extremity dressing,    Data Reviewed: Basic Metabolic Panel:  Recent Labs Lab 05/29/14 1220 05/29/14 1240 05/31/14 1718 06/01/14 0757 06/02/14 0446  NA 140  --  145  146 140  K 6.2* 4.1 3.1* 3.2* 3.6*  CL 97  --  98 99 97  CO2  --   --  37* 40* 39*  GLUCOSE 123*  --  105* 110* 139*  BUN 35*  --  21 15 14   CREATININE 1.10  --  0.98 0.82 0.88  CALCIUM  --   --  8.2* 7.7* 7.8*  MG  --   --   --  1.2*  --    Liver Function Tests:  Recent Labs Lab 05/31/14 1718  AST 20  ALT 26  ALKPHOS 96  BILITOT 0.5  PROT 7.0  ALBUMIN 2.4*   No results found for this basename: LIPASE, AMYLASE,  in the last 168 hours No results found for this basename: AMMONIA,  in the last 168 hours CBC:  Recent Labs Lab 05/29/14 1205 05/29/14 1220 05/31/14 1718  WBC 10.1  --  8.8  NEUTROABS 8.3*  --   --   HGB 16.7 19.7* 15.8  HCT 53.2* 58.0* 52.1*  MCV 99.1  --  100.2*  PLT PLATELET CLUMPS NOTED ON SMEAR, COUNT APPEARS ADEQUATE  --  244   Cardiac Enzymes:  Recent Labs Lab 05/29/14 1240 06/01/14 0019 06/01/14 0730 06/01/14 1323  TROPONINI <0.30 <0.30 <0.30 <0.30   BNP (last 3 results)  Recent Labs  07/13/13 1620 05/29/14 1206 05/31/14 1718  PROBNP 1489.0* 3056.0* 3281.0*   CBG:  Recent Labs Lab 06/01/14 1154 06/01/14 1702 06/01/14 2209 06/02/14 0726 06/02/14 1215  GLUCAP 137* 122* 124* 125* 136*    Recent Results (from the past 240 hour(s))  CULTURE, BLOOD (ROUTINE X 2)     Status: None   Collection Time    05/29/14 11:52 AM      Result Value Ref Range Status   Specimen Description BLOOD LEFT ARM   Final   Special Requests BOTTLES DRAWN AEROBIC ONLY 5CC   Final   Culture  Setup Time     Final   Value: 05/29/2014 17:42     Performed at [facility]   Culture     Final   Value:        BLOOD CULTURE RECEIVED NO GROWTH TO DATE CULTURE WILL BE HELD FOR 5 DAYS BEFORE ISSUING A FINAL NEGATIVE REPORT     Performed at [facility]   Report Status PENDING   Incomplete  CULTURE, BLOOD (ROUTINE X 2)     Status: None   Collection Time    05/29/14 12:56 PM      Result Value Ref Range Status   Specimen Description BLOOD LEFT  ANTECUBITAL   Final   Special Requests BOTTLES DRAWN AEROBIC AND ANAEROBIC 10CCS   Final   Culture  Setup Time     Final   Value: 05/29/2014 17:43     Performed at [facility]   Culture     Final   Value:        BLOOD CULTURE RECEIVED NO GROWTH TO DATE CULTURE WILL BE HELD FOR 5 DAYS BEFORE ISSUING A FINAL NEGATIVE REPORT     Performed at [facility]   Report  Status PENDING   Incomplete     Studies: Dg Chest 2 View  05/31/2014   CLINICAL DATA:  Weakness, shortness of breath  EXAM: CHEST  2 VIEW  COMPARISON:  05/29/2014  FINDINGS: Stable cardiomegaly. No pleural effusion or pneumothorax. Bilateral interstitial thickening and prominence of the central pulmonary vasculature. Unremarkable osseous structures.  IMPRESSION: Findings concerning for mild CHF.   Electronically Signed   By: Elige KoHetal  Patel   On: 05/31/2014 19:01    Scheduled Meds: . amLODipine  10 mg Oral q morning - 10a  . antiseptic oral rinse  15 mL Mouth Rinse BID  . aspirin EC  81 mg Oral Daily  . cholecalciferol  2,000 Units Oral q morning - 10a  . cloNIDine  0.2 mg Oral BID  . feeding supplement (PRO-STAT SUGAR FREE 64)  30 mL Oral BID WC  . ferrous sulfate  650 mg Oral QPC breakfast  . folic acid  1 mg Oral q morning - 10a  . furosemide  40 mg Oral Daily  . insulin aspart  0-9 Units Subcutaneous TID WC  . ipratropium-albuterol  3 mL Nebulization Daily  . levofloxacin (LEVAQUIN) IV  750 mg Intravenous Q24H  . nutrition supplement (JUVEN)  1 packet Oral q morning - 10a  . pantoprazole  40 mg Oral Daily  . potassium chloride  40 mEq Oral BID  . simvastatin  5 mg Oral q1800  . sodium chloride  3 mL Intravenous Q12H  . spironolactone  12.5 mg Oral q morning - 10a  . thiamine  100 mg Oral q morning - 10a  . vancomycin  1,500 mg Intravenous Q12H   Continuous Infusions: . sodium chloride 10 mL/hr at 06/01/14 0139    Active Problems:   DM   CHF (congestive heart failure)   Cellulitis and abscess of  leg   Heart failure    Time spent: 35 minutes    [facility],GAGAN S  [facility] Pager [phone] If 7PM-7AM, please contact night-coverage at [email or web address], password [email or web address] 06/02/2014, 3:56 PM  LOS: 2 days

## 2014-06-02 NOTE — Clinical Documentation Improvement (Signed)
Possible Clinical Conditions?  Please clarify level and type of CHF.  Chronic Systolic Congestive Heart Failure Chronic Diastolic Congestive Heart Failure Chronic Systolic & Diastolic Congestive Heart Failure Acute Systolic Congestive Heart Failure Acute Diastolic Congestive Heart Failure Acute Systolic & Diastolic Congestive Heart Failure Acute on Chronic Systolic Congestive Heart Failure Acute on Chronic Diastolic Congestive Heart Failure Acute on Chronic Systolic & Diastolic Congestive Heart Failure Other Condition________________________________________ Cannot Clinically Determine  Supporting Information: ED notes: ZOX:[id number]:Diastolic CHF, acute Echo 3/09 w EF 65-75%, moderate LVH,  seen 2 days ago and was found to have acute on chronic CHF; Will admit for CHF exacerbation  06/01/14 note: Active Problems:CHF (congestive heart failure)   Risk Factors:: Cellulitis. Acute respiratory failure. PNA. Current tobacco use. Hypertension. Diabetes mellitus. Dyslipidemia  Signs & Symptoms: Diagnostics:Recent Labs   07/13/13 1620  05/29/14 1206  05/31/14 1718   PROBNP  1489.0*  3056.0*  3281.0*    05/31/14 CHEST 2 VIEW:FINDINGS:  Stable cardiomegaly. No pleural effusion or pneumothorax. Bilateral  interstitial thickening and prominence of the central pulmonary  vasculature. Unremarkable osseous structures.  IMPRESSION:  Findings concerning for mild CHF  06/01/14  Transthoracic Echocardiography LV EF: 55% - 60% Indications: CHF - 428.0 Study Conclusions  - Left ventricle: The cavity size was normal. Wall thickness was normal. Systolic function was normal. The estimated ejection fraction was in the range of 55% to 60%. Doppler parameters are consistent with abnormal left ventricular relaxation (grade 1 diastolic dysfunction  Treatment:furosemide (LASIX) 40 MG tablet daily Continue Aldactone 12.5 mg daily    Thank You, Andy GaussGarnet C Jaxten Brosh ,RN Clinical Documentation Specialist:   [phone]  [facility]- Health Information Management

## 2014-06-03 DIAGNOSIS — J189 Pneumonia, unspecified organism: Secondary | ICD-10-CM

## 2014-06-03 DIAGNOSIS — J9600 Acute respiratory failure, unspecified whether with hypoxia or hypercapnia: Secondary | ICD-10-CM

## 2014-06-03 LAB — BASIC METABOLIC PANEL
BUN: 15 mg/dL (ref 6–23)
CHLORIDE: 98 meq/L (ref 96–112)
CO2: 37 mEq/L — ABNORMAL HIGH (ref 19–32)
Calcium: 8.1 mg/dL — ABNORMAL LOW (ref 8.4–10.5)
Creatinine, Ser: 0.9 mg/dL (ref 0.50–1.35)
GFR, EST NON AFRICAN AMERICAN: 87 mL/min — AB (ref >90)
Glucose, Bld: 108 mg/dL — ABNORMAL HIGH (ref 70–99)
POTASSIUM: 3.6 meq/L — AB (ref 3.7–5.3)
Sodium: 143 mEq/L (ref 137–147)

## 2014-06-03 LAB — GLUCOSE, CAPILLARY
GLUCOSE-CAPILLARY: 155 mg/dL — AB (ref 70–99)
Glucose-Capillary: 167 mg/dL — ABNORMAL HIGH (ref 70–99)
Glucose-Capillary: 124 mg/dL — ABNORMAL HIGH (ref 70–99)
Glucose-Capillary: 116 mg/dL — ABNORMAL HIGH (ref 70–99)

## 2014-06-03 LAB — VANCOMYCIN, TROUGH: VANCOMYCIN TR: 27.4 ug/mL — AB (ref 10.0–20.0)

## 2014-06-03 NOTE — Progress Notes (Signed)
PT Cancellation Note  Patient Details Name: Bryce MountJoseph P Viles MRN: [id number] DOB: 09/13/1946   Cancelled Treatment:    Reason Eval/Treat Not Completed: Other (comment) Pt agitated this morning and sleeping upon checking back this afternoon.  Will check back as schedule permits.   LEMYRE,KATHrine E 06/03/2014, 3:14 PM Zenovia JarredKati Lemyre, PT, DPT 06/03/2014 Pager: [phone]
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
## 2014-06-03 NOTE — Progress Notes (Signed)
Security officer spoke with patient and confiscated cigarettes and lighter.  Taken by officer and locked up.

## 2014-06-03 NOTE — ED Provider Notes (Signed)
Medical screening examination/treatment/procedure(s) were conducted as a shared visit with non-physician practitioner(s) and myself.  I personally evaluated the patient during the encounter.   EKG Interpretation   Date/Time:  Friday May 29 2014 12:17:03 EDT Ventricular Rate:  108 PR Interval:  120 QRS Duration: 149 QT Interval:  463 QTC Calculation: 621 R Axis:   -80 Text Interpretation:  Sinus tachycardia Ventricular premature complex  Biatrial enlargement Right bundle branch block Probable inferior infarct,  acute Anterolateral infarct, age indeterminate ED PHYSICIAN INTERPRETATION  [facility]ABLE [facility] Confirmed by TEST, Record ([id number]) on  05/31/2014 10:16:03 AM      Please see my initial dictation on this patient.  Rolland PorterMark James, MD 06/03/14 [phone]

## 2014-06-03 NOTE — Progress Notes (Signed)
Patient laying in bed upon entering room.  Nurse tech informed Clinical research associatewriter patient had cigarettes in bag at bedside.  Writer and nurse tech informed patient that our hospital is a tobacco free campus and he could not smoke.  Patient told nurse tech "fuck you, I can do what I want to do."  Patient reinforced education by writer and explained the dangers of smoking with oxygen around.  Patient yelled at staff " get the fuck out of my room and leave me alone."  Nurse tech placed bag of cigarettes in closet until security can come up and secure.  Will continue to monitor patient.

## 2014-06-03 NOTE — Progress Notes (Addendum)
Patient ID: Bryce MountJoseph P [name], male   DOB: 01/26/1946, 68 y.o.   MRN: [id number] [facility] PROGRESS NOTE  Bryce [name] VOZ:[id number] DOB: 07/23/1946 DOA: 05/31/2014 PCP: Dorrene GermanAVBUERE,EDWIN A, MD  Brief narrative: 68 y.o. male with history of hypertension, diabetes, anemia, congestive heart failure with significant lower extremity cellulitis and skin maceration, buttock and perirectal area. He was admitted on 05/31/2014 with working diagnosis of chronic wound infection involving multiple areas. Throughout the hospital stay he has refused certain medications and seems to have poor insight about his medical condition and treatment.  Assessment/Plan:   Principal Problem: Cellulitis and chronic wounds of legs,, buttocks  WOC assessment: Numerous skin issues on bilateral LEs and in perineum secondary to moisture. Also noted is intertriginous dermatitis. Combination ulcer on right posterior thigh from moisture and pressure. Right posterior thigh measuring 9 cm x 6 cm and 0.2 cm. Perineum: Numerous partial thickness and scattered open areas with deeper lesions in the creases. Intertriginous dermatitis with ulcerations (full thickness) at the pannus in an area measuring 6 cm x 3 cm with the largest open area measuring 1 cm x 1.5 cm x 0.2 cm. Bilateral LEs with open areas: the left with an ulcer at the posterior aspect (Achilles Area) measuring 2 cm x 1.5 cm x 0.2 cm and the right with an area of hypertrophic tissue measuring 10 cm x 20 cm with weeping serous exudate.  Dressing recommendations: re-apply Unna's boots, etiology more likely lymphedema than venous insufficiency. Needs more than one per extremity to properly address his size. Use saline dressings on the right posterior thigh and in the deeper crevices in the perineum, topical zinc-oxide based skin protective cream to the scattered open areas on the buttocks due to moisture. Continue therapeutic mattress with low air loss feature. Also needs  pressure redistribution heel boots to prevent pressure ulcerations.  Please note that the patient has refused IV medications throughout the hospital course but has finally been started on IV Levaquin 06/02/2014.  Active Problems: Guaiac-positive stools  Likely secondary to perineal wounds that bleed easily especially as noted by WOC areas exposed to urine and stool which are very friable and bleed even with cleansing   Hemoglobin is WNL; no current indications for transfusion Chronic diastolic congestive heart failure  2 D ECHO in 2014 showed EF in 55%   Pro-BNP on this admission is 3281  Seems to be compensated; no shortness of breath or chest pain  Initial troponin was 0.18 but subsequent troponin levels x 2 negative   Continue lasix 40 mg daily; replete potassium as needed  Continue aspirin daily and simvastatin daily Hypokalemia and hypomagnesemia  Supplement and repeat BMP and Mg level in AM Diabetes mellitus  Continue sliding scale insulin  CBG's in past 24 hours: 122, 167, 124 Hypertension  Continue Catapres 0.2 mg twice a day, amlodipine 10 mg daily, Aldactone 12.5 mg daily. Iron deficiency anemia  Continue ferrous sulfate 325 mg by mouth daily Severe malnutrition  Not eating much and this has been rather progressive  Pt's caretaker spends two hours per day with him and says pt has poor oral intake, no support at home   Code Status: DNR/DNI Family Communication: Pt and caretaker at bedside Disposition Plan: Will likely need SNF but refusing at this time    Consultants:  None Procedures:  None Antibiotics:  Levaquin 6/23 -->  HPI/Subjective: No events overnight.   Objective: Filed Vitals:   06/02/14 2147 06/03/14 0655 06/03/14 1225 06/03/14 1530  BP: 119/68  115/76  110/67  Pulse: 103 94  93  Temp: 97.8 F (36.6 C) 98.1 F (36.7 C)  98.4 F (36.9 C)  TempSrc: Oral Oral  Oral  Resp: 18 18  20   Height:      Weight:  133.811 kg (295 lb)     SpO2: 94% 97% 93% 95%    Intake/Output Summary (Last 24 hours) at 06/03/14 1623 Last data filed at 06/03/14 1531  Gross per 24 hour  Intake   1250 ml  Output   3300 ml  Net  -2050 ml    Exam:   General:  Pt is alert, follows commands appropriately, not in acute distress, morbid obesity   Cardiovascular: Regular rate and rhythm, S1/S2 appreciated   Respiratory: Clear to auscultation bilaterally, diminished breath sounds at bases   Abdomen: Soft, non tender, non distended, bowel sounds present, no guarding  Extremities: has extensive perineal wounds, LE wounds  Neuro: No focal neurologic deficits   Data Reviewed: Basic Metabolic Panel:  Recent Labs Lab 05/29/14 1220 05/29/14 1240 05/31/14 1718 06/01/14 0757 06/02/14 0446 06/03/14 0504  NA 140  --  145 146 140 143  K 6.2* 4.1 3.1* 3.2* 3.6* 3.6*  CL 97  --  98 99 97 98  CO2  --   --  37* 40* 39* 37*  GLUCOSE 123*  --  105* 110* 139* 108*  BUN 35*  --  21 15 14 15   CREATININE 1.10  --  0.98 0.82 0.88 0.90  CALCIUM  --   --  8.2* 7.7* 7.8* 8.1*  MG  --   --   --  1.2*  --   --    Liver Function Tests:  Recent Labs Lab 05/31/14 1718  AST 20  ALT 26  ALKPHOS 96  BILITOT 0.5  PROT 7.0  ALBUMIN 2.4*   No results found for this basename: LIPASE, AMYLASE,  in the last 168 hours No results found for this basename: AMMONIA,  in the last 168 hours CBC:  Recent Labs Lab 05/29/14 1205 05/29/14 1220 05/31/14 1718 06/02/14 1646  WBC 10.1  --  8.8 6.6  NEUTROABS 8.3*  --   --   --   HGB 16.7 19.7* 15.8 13.6  HCT 53.2* 58.0* 52.1* 48.5  MCV 99.1  --  100.2* 105.7*  PLT PLATELET CLUMPS NOTED ON SMEAR, COUNT APPEARS ADEQUATE  --  244 220   Cardiac Enzymes:  Recent Labs Lab 05/29/14 1240 06/01/14 0019 06/01/14 0730 06/01/14 1323  TROPONINI <0.30 <0.30 <0.30 <0.30   CBG:  Recent Labs Lab 06/02/14 1215 06/02/14 1722 06/02/14 2152 06/03/14 0747 06/03/14 1215  GLUCAP 136* 176* 122* 167* 124*     CULTURE, BLOOD (ROUTINE X 2)     Status: None   Collection Time    05/29/14 11:52 AM      Result Value Ref Range Status   Specimen Description BLOOD LEFT ARM   Final   Value:        BLOOD CULTURE RECEIVED NO GROWTH TO DATE CULTURE WILL BE HELD FOR 5 DAYS BEFORE ISSUING A FINAL NEGATIVE REPORT     Performed at [facility]   Report Status PENDING   Incomplete  CULTURE, BLOOD (ROUTINE X 2)     Status: None   Collection Time    05/29/14 12:56 PM      Result Value Ref Range Status   Specimen Description BLOOD LEFT ANTECUBITAL   Final   Value:  BLOOD CULTURE RECEIVED NO GROWTH TO DATE CULTURE WILL BE HELD FOR 5 DAYS BEFORE ISSUING A FINAL NEGATIVE REPORT     [facility]   Report Status PENDING   Incomplete     Scheduled Meds: . amLODipine  10 mg Oral q morning - 10a  . aspirin EC  81 mg Oral Daily  . cloNIDine  0.2 mg Oral BID  . feeding supplement (PRO-STAT SUGAR FREE 64)  30 mL Oral BID WC  . ferrous sulfate  650 mg Oral QPC breakfast  . folic acid  1 mg Oral q morning - 10a  . furosemide  40 mg Oral Daily  . insulin aspart  0-9 Units Subcutaneous TID WC  . ipratropium-albuterol  3 mL Nebulization Daily  . levofloxacin (LEVAQUIN)   750 mg Intravenous Q24H  . nutrition supplement   1 packet Oral q morning - 10a  . pantoprazole  40 mg Oral Daily  . simvastatin  5 mg Oral q1800  . spironolactone  12.5 mg Oral q morning - 10a  . thiamine  100 mg Oral q morning - 10a   Continuous Infusions: . sodium chloride 10 mL/hr at 06/01/14 0139     Debbora PrestoMAGICK-Rim Thatch, MD  [facility] Pager [phone]  If 7PM-7AM, please contact night-coverage [email or web address] Password [email or web address] 06/03/2014, 4:23 PM   LOS: 3 days

## 2014-06-04 ENCOUNTER — Ambulatory Visit: Payer: Medicare Other | Admitting: Rehabilitative and Restorative Service Providers"

## 2014-06-04 LAB — CULTURE, BLOOD (ROUTINE X 2)
Culture: NO GROWTH
Culture: NO GROWTH

## 2014-06-04 LAB — BASIC METABOLIC PANEL
BUN: 13 mg/dL (ref 6–23)
CO2: 42 meq/L — AB (ref 19–32)
Calcium: 8.5 mg/dL (ref 8.4–10.5)
Chloride: 95 mEq/L — ABNORMAL LOW (ref 96–112)
Creatinine, Ser: 0.86 mg/dL (ref 0.50–1.35)
GFR calc Af Amer: >90 mL/min (ref >90)
GFR calc non Af Amer: 88 mL/min — ABNORMAL LOW (ref >90)
GLUCOSE: 108 mg/dL — AB (ref 70–99)
Potassium: 3.9 mEq/L (ref 3.7–5.3)
SODIUM: 144 meq/L (ref 137–147)

## 2014-06-04 LAB — GLUCOSE, CAPILLARY
GLUCOSE-CAPILLARY: 94 mg/dL (ref 70–99)
GLUCOSE-CAPILLARY: 132 mg/dL — AB (ref 70–99)
Glucose-Capillary: 88 mg/dL (ref 70–99)
Glucose-Capillary: 159 mg/dL — ABNORMAL HIGH (ref 70–99)

## 2014-06-04 LAB — VANCOMYCIN, RANDOM: Vancomycin Rm: 17.8 ug/mL

## 2014-06-04 LAB — CBC
HEMATOCRIT: 51 % (ref 39.0–52.0)
HEMOGLOBIN: 14.2 g/dL (ref 13.0–17.0)
MCH: 29.9 pg (ref 26.0–34.0)
MCHC: 27.8 g/dL — ABNORMAL LOW (ref 30.0–36.0)
MCV: 107.4 fL — ABNORMAL HIGH (ref 78.0–100.0)
Platelets: 241 10*3/uL (ref 150–400)
RBC: 4.75 MIL/uL (ref 4.22–5.81)
RDW: 16.1 % — ABNORMAL HIGH (ref 11.5–15.5)
WBC: 6.7 10*3/uL (ref 4.0–10.5)

## 2014-06-04 MED ORDER — VANCOMYCIN HCL 10 G IV SOLR
1250.0000 mg | INTRAVENOUS | Status: DC
  Administered 2014-06-04 – 2014-06-05 (×2): 1250 mg via INTRAVENOUS
  Filled 2014-06-04 (×2): qty 1250

## 2014-06-04 NOTE — Evaluation (Signed)
Physical Therapy Evaluation Patient Details Name: Perry MountJoseph P Killmer MRN: [id number] DOB: 06/04/1946 Today's Date: 06/04/2014   History of Present Illness  68 y.o. male with history of hypertension, diabetes, anemia, congestive heart failure with significant lower extremity cellulitis and skin maceration, buttock and perirectal area who was admitted on 05/31/2014 with working diagnosis of chronic wound infection involving multiple areas. Per MD notes: "Throughout the hospital stay he has refused certain medications and seems to have poor insight about his medical condition and treatment."  Clinical Impression  Pt admitted with above. Pt currently with functional limitations due to the deficits listed below (see PT Problem List).  Pt will benefit from skilled PT to increase their independence and safety with mobility to allow discharge to the venue listed below.  Pt presents very deconditioned with decreased strength, endurance and sitting balance at this time.  Recommend SNF at this time.      Follow Up Recommendations SNF    Equipment Recommendations  None recommended by PT    Recommendations for Other Services       Precautions / Restrictions Precautions Precautions: Fall      Mobility  Bed Mobility Overal bed mobility: +2 for physical assistance;Needs Assistance Bed Mobility: Sidelying to Sit;Sit to Sidelying   Sidelying to sit: +2 for physical assistance;Total assist     Sit to sidelying: +2 for physical assistance;Total assist General bed mobility comments: verbal cues for technique, pt able to assist very minimally with reaching and grabbing rail, present very deconditioned  Transfers                    Ambulation/Gait                Stairs            Wheelchair Mobility    Modified Rankin (Stroke Patients Only)       Balance Overall balance assessment: Needs assistance Sitting-balance support: Single extremity supported;Feet  unsupported Sitting balance-Leahy Scale: Zero Sitting balance - Comments: requires assist for trunk control, only able to tolerate sitting EOB (towards pt's L side) approx 5 min Postural control: Left lateral lean (possibly due to bed positioning)                                   Pertinent Vitals/Pain Activity to tolerance, repositioned to comfort    Home Living Family/patient expects to be discharged to:: Private residence Living Arrangements: Alone           Home Layout: One level Home Equipment: Environmental consultantWalker - 2 wheels;Wheelchair - power Additional Comments: pt not very forthcoming about home environment or PLOF    Prior Function Level of Independence: Independent with assistive device(s)         Comments: pt reports independence with mobility      Hand Dominance        Extremity/Trunk Assessment   Upper Extremity Assessment: Generalized weakness           Lower Extremity Assessment: Generalized weakness;RLE deficits/detail;LLE deficits/detail RLE Deficits / Details: unable to lift LEs against gravity LLE Deficits / Details: unable to lift LEs against gravity     Communication   Communication: Other (comment) (difficult to hear at times)  Cognition Arousal/Alertness: Awake/alert Behavior During Therapy: Flat affect Overall Cognitive Status: Within Functional Limits for tasks assessed  General Comments      Exercises        Assessment/Plan    PT Assessment Patient needs continued PT services  PT Diagnosis Generalized weakness   PT Problem List Decreased strength;Decreased activity tolerance;Decreased mobility;Decreased balance;Obesity  PT Treatment Interventions DME instruction;Gait training;Functional mobility training;Therapeutic activities;Therapeutic exercise;Patient/family education;Wheelchair mobility training;Balance training;Neuromuscular re-education   PT Goals (Current goals can be found in the  Care Plan section) Acute Rehab PT Goals PT Goal Formulation: With patient Time For Goal Achievement: 06/18/14 Potential to Achieve Goals: Good    Frequency Min 2X/week   Barriers to discharge        Co-evaluation               End of Session   Activity Tolerance: Patient limited by fatigue Patient left: in bed;with call bell/phone within reach           Time: 0902-0923 PT Time Calculation (min): 21 min   Charges:   PT Evaluation $Initial PT Evaluation Tier I: 1 Procedure PT Treatments $Therapeutic Activity: 8-22 mins   PT G Codes:          Bernadean Saling,KATHrine E 06/04/2014, 11:30 AM Zenovia JarredKati Boniface Goffe, PT, DPT 06/04/2014 Pager: [phone]

## 2014-06-04 NOTE — Progress Notes (Signed)
Patient ID: Bryce [name], male   DOB: August 19, 1946, 68 y.o.   MRN: [id number] [facility] PROGRESS NOTE  Bryce [name] [id number] DOB: 12-01-1946 DOA: 05/31/2014 PCP: Dorrene German, MD  Brief narrative: 68 y.o. male with history of hypertension, diabetes, anemia, congestive heart failure with significant lower extremity cellulitis and skin maceration, buttock and perirectal area. He was admitted on 05/31/2014 with working diagnosis of chronic wound infection involving multiple areas. Throughout the hospital stay he has refused certain medications and seems to have poor insight about his medical condition and treatment.   Assessment/Plan:   Principal Problem:  Cellulitis and chronic wounds of legs,, buttocks  WOC assessment: Numerous skin issues on bilateral LEs and in perineum secondary to moisture. Also noted is intertriginous dermatitis. Combination ulcer on right posterior thigh from moisture and pressure. Right posterior thigh measuring 9 cm x 6 cm and 0.2 cm. Perineum: Numerous partial thickness and scattered open areas with deeper lesions in the creases. Intertriginous dermatitis with ulcerations (full thickness) at the pannus in an area measuring 6 cm x 3 cm with the largest open area measuring 1 cm x 1.5 cm x 0.2 cm. Bilateral LEs with open areas: the left with an ulcer at the posterior aspect (Achilles Area) measuring 2 cm x 1.5 cm x 0.2 cm and the right with an area of hypertrophic tissue measuring 10 cm x 20 cm with weeping serous exudate. Dressing recommendations: re-apply Unna's boots, etiology more likely lymphedema than venous insufficiency. Needs more than one per extremity to properly address his size. Use saline dressings on the right posterior thigh and in the deeper crevices in the perineum, topical zinc-oxide based skin protective cream to the scattered open areas on the buttocks due to moisture. Continue therapeutic mattress with low air loss feature. Also needs  pressure redistribution heel boots to prevent pressure ulcerations.  Please note that the patient has refused IV medications throughout the hospital course but has finally been started on IV Levaquin 06/02/2014. Patient also took vancomycin on 06/01/2014 but then has refused to take it. We restarted vanco today 06/04/2014. Active Problems:  Guaiac-positive stools  Likely secondary to perineal wounds that bleed easily especially as noted by WOC areas exposed to urine and stool which are very friable and bleed even with cleansing  Hemoglobin is WNL; no current indications for transfusion Chronic diastolic congestive heart failure  2 D ECHO in 2014 showed EF in 55%. CHF compensated at this time. Pro-BNP on this admission is 3281  Initial troponin was 0.18 but subsequent troponin levels x 2 negative  Continue lasix 40 mg daily; potassium supplemented and WNL this am Continue aspirin daily and simvastatin daily Hypokalemia and hypomagnesemia  Supplemented Diabetes mellitus  Continue sliding scale insulin  CBG's in past 24 hours: 94, 88, 159 Hypertension  Continue Catapres 0.2 mg twice a day, amlodipine 10 mg daily, Aldactone 12.5 mg daily. Iron deficiency anemia  Continue ferrous sulfate 325 mg by mouth daily Severe protein calorie malnutrition Nutrition consulted  Pt's caretaker spends two hours per day with him and says pt has poor oral intake, no support at home    Code Status: DNR/DNI  Family Communication: Pt and caretaker at bedside  Disposition Plan: Will likely need SNF but refusing at this time; remains inpatient for now  Consultants:  WOC Procedures:  None Antibiotics:  Levaquin 06/02/2014 --> Vancomycin 06/01/2014 and then 06/04/2014 -->   HPI/Subjective: No events overnight.   Objective: Filed Vitals:   06/04/14 5784 06/04/14 6962  06/04/14 1156 06/04/14 1414  BP: 121/82  112/74 110/68  Pulse: 99  98 96  Temp: 98 F (36.7 C)   98.1 F (36.7 C)  TempSrc: Axillary    Oral  Resp:    18  Height:      Weight: 118.842 kg (262 lb)     SpO2: 93% 97%  98%    Intake/Output Summary (Last 24 hours) at 06/04/14 1717 Last data filed at 06/04/14 1352  Gross per 24 hour  Intake   1150 ml  Output   2050 ml  Net   -900 ml    Exam:  General: Pt is not in acute distress  Cardiovascular: Regular rate and rhythm, S1/S2 appreciated  Respiratory: bilateral air entry, no wheezing  Abdomen: Soft, non tender, non distended, bowel sounds present, no guarding  Extremities: extensive perineal wounds, LE wounds  Neuro: alert, no focal neurologic deficits   Data Reviewed: Basic Metabolic Panel:  Recent Labs Lab 05/31/14 1718 06/01/14 0757 06/02/14 0446 06/03/14 0504 06/04/14 0119  NA 145 146 140 143 144  K 3.1* 3.2* 3.6* 3.6* 3.9  CL 98 99 97 98 95*  CO2 37* 40* 39* 37* 42*  GLUCOSE 105* 110* 139* 108* 108*  BUN 21 15 14 15 13   CREATININE 0.98 0.82 0.88 0.90 0.86  CALCIUM 8.2* 7.7* 7.8* 8.1* 8.5  MG  --  1.2*  --   --   --    Liver Function Tests:  Recent Labs Lab 05/31/14 1718  AST 20  ALT 26  ALKPHOS 96  BILITOT 0.5  PROT 7.0  ALBUMIN 2.4*   No results found for this basename: LIPASE, AMYLASE,  in the last 168 hours No results found for this basename: AMMONIA,  in the last 168 hours CBC:  Recent Labs Lab 05/29/14 1205 05/29/14 1220 05/31/14 1718 06/02/14 1646 06/04/14 0119  WBC 10.1  --  8.8 6.6 6.7  NEUTROABS 8.3*  --   --   --   --   HGB 16.7 19.7* 15.8 13.6 14.2  HCT 53.2* 58.0* 52.1* 48.5 51.0  MCV 99.1  --  100.2* 105.7* 107.4*  PLT PLATELET CLUMPS NOTED ON SMEAR, COUNT APPEARS ADEQUATE  --  244 220 241   Cardiac Enzymes:  Recent Labs Lab 05/29/14 1240 06/01/14 0019 06/01/14 0730 06/01/14 1323  TROPONINI <0.30 <0.30 <0.30 <0.30   BNP: No components found with this basename: POCBNP,  CBG:  Recent Labs Lab 06/03/14 1710 06/03/14 2148 06/04/14 0809 06/04/14 1142 06/04/14 1640  GLUCAP 155* 116* 94 88 159*     CULTURE, BLOOD (ROUTINE X 2)     Status: None   Collection Time    05/29/14 11:52 AM      Result Value Ref Range Status   Specimen Description BLOOD LEFT ARM   Final   Value: NO GROWTH 5 DAYS     Performed at [facility]   Report Status 06/04/2014 FINAL   Final  CULTURE, BLOOD (ROUTINE X 2)     Status: None   Collection Time    05/29/14 12:56 PM      Result Value Ref Range Status   Specimen Description BLOOD LEFT ANTECUBITAL   Final   Value: NO GROWTH 5 DAYS     [facility]formed at [facility]   Report Status 06/04/2014 FINAL   Final     Scheduled Meds: . amLODipine  10 mg Oral q morning - 10a  . aspirin EC  81 mg Oral Daily  .  cholecalciferol  2,000 Units Oral q morning - 10a  . cloNIDine  0.2 mg Oral BID  . feeding supplement  30 mL Oral BID WC  . ferrous sulfate  650 mg Oral QPC breakfast  . folic acid  1 mg Oral q morning - 10a  . furosemide  40 mg Oral Daily  . insulin aspart  0-9 Units Subcutaneous TID WC  . ipratropium-albuterol  3 mL Nebulization Daily  . levofloxacin (LEVAQUIN)   750 mg Intravenous Q24H  . nutrition supplement   1 packet Oral q morning - 10a  . pantoprazole  40 mg Oral Daily  . simvastatin  5 mg Oral q1800  . spironolactone  12.5 mg Oral q morning - 10a  . thiamine  100 mg Oral q morning - 10a  . vancomycin  1,250 mg Intravenous Q24H   Continuous Infusions: . sodium chloride 10 mL/hr at 06/01/14 0139     Debbora PrestoMAGICK-MYERS, ISKRA, MD  [facility] Pager [phone]  If 7PM-7AM, please contact night-coverage [email or web address] Password [email or web address] 06/04/2014, 5:17 PM   LOS: 4 days

## 2014-06-04 NOTE — Progress Notes (Signed)
CRITICAL VALUE ALERT  Critical value received:  CO2 42  Date of notification:  06/04/14  Time of notification: 0235  Critical value read back:yes  Nurse who received alert: Karie SchwalbeJoanne Scotton  MD notified (1st page):  yes  Time of first page:  [phone]

## 2014-06-04 NOTE — Progress Notes (Signed)
ANTIBIOTIC CONSULT NOTE - FOLLOW UP  Pharmacy Consult for Vancomycin Indication: Cellulitis  Allergies  Allergen Reactions  . Penicillins Other (See Comments)    "I fall out"    Patient Measurements: Height: 5\' 7"  (170.2 cm) Weight: 295 lb (133.811 kg) IBW/kg (Calculated) : 66.1 Adjusted Body Weight:   Vital Signs: Temp: 98.4 F (36.9 C) (06/24 2144) Temp src: Oral (06/24 2144) BP: 128/83 mmHg (06/24 2315) Pulse Rate: 97 (06/24 2315) Intake/Output from previous day: 06/24 0701 - 06/25 0700 In: 470 [P.O.:360; I.V.:110] Out: 2900 [Urine:2900] Intake/Output from this shift: Total I/O In: -  Out: 400 [Urine:400]  Labs:  Recent Labs  06/02/14 0446 06/02/14 1646 06/03/14 0504 06/04/14 0119  WBC  --  6.6  --  6.7  HGB  --  13.6  --  14.2  PLT  --  220  --  241  CREATININE 0.88  --  0.90 0.86   Estimated Creatinine Clearance: 111.4 ml/min (by C-G formula based on Cr of 0.86).  Recent Labs  06/03/14 1205 06/04/14 0015  VANCOTROUGH 27.4*  --   VANCORANDOM  --  17.8     Microbiology: Recent Results (from the past 720 hour(s))  CULTURE, BLOOD (ROUTINE X 2)     Status: None   Collection Time    05/29/14 11:52 AM      Result Value Ref Range Status   Specimen Description BLOOD LEFT ARM   Final   Special Requests BOTTLES DRAWN AEROBIC ONLY 5CC   Final   Culture  Setup Time     Final   Value: 05/29/2014 17:42     Performed at [facility]   Culture     Final   Value:        BLOOD CULTURE RECEIVED NO GROWTH TO DATE CULTURE WILL BE HELD FOR 5 DAYS BEFORE ISSUING A FINAL NEGATIVE REPORT     Performed at [facility]   Report Status PENDING   Incomplete  CULTURE, BLOOD (ROUTINE X 2)     Status: None   Collection Time    05/29/14 12:56 PM      Result Value Ref Range Status   Specimen Description BLOOD LEFT ANTECUBITAL   Final   Special Requests BOTTLES DRAWN AEROBIC AND ANAEROBIC 10CCS   Final   Culture  Setup Time     Final   Value: 05/29/2014  17:43     Performed at [facility]   Culture     Final   Value:        BLOOD CULTURE RECEIVED NO GROWTH TO DATE CULTURE WILL BE HELD FOR 5 DAYS BEFORE ISSUING A FINAL NEGATIVE REPORT     Performed at [facility]   Report Status PENDING   Incomplete    Anti-infectives   Start     Dose/Rate Route Frequency Ordered Stop   06/04/14 0600  vancomycin (VANCOCIN) 1,250 mg in sodium chloride 0.9 % 250 mL IVPB     1,250 mg 166.7 mL/hr over 90 Minutes Intravenous Every 24 hours 06/04/14 0528     06/02/14 0000  vancomycin (VANCOCIN) 1,500 mg in sodium chloride 0.9 % 500 mL IVPB  Status:  Discontinued     1,500 mg 250 mL/hr over 120 Minutes Intravenous Every 12 hours 06/01/14 1035 06/03/14 1308   06/01/14 1200  vancomycin (VANCOCIN) 2,500 mg in sodium chloride 0.9 % 500 mL IVPB     2,500 mg 250 mL/hr over 120 Minutes Intravenous  Once 06/01/14 1035  06/01/14 1439   06/01/14 0000  levofloxacin (LEVAQUIN) IVPB 750 mg     750 mg 100 mL/hr over 90 Minutes Intravenous Every 24 hours 05/31/14 2359        Assessment: Patient with vancomycin level still above goal but closer to goal.  SCr stable.  Goal of Therapy:  Vancomycin trough level 10-15 mcg/ml  Plan:  Measure antibiotic drug levels at steady state Follow up culture results Begin vancomycin 1250mg  iv q24hr at 0600  Darlina GuysGrimsley Jr, Jacquenette ShoneJulian Crowford 06/04/2014,5:29 AM

## 2014-06-05 DIAGNOSIS — R9431 Abnormal electrocardiogram [ECG] [EKG]: Secondary | ICD-10-CM

## 2014-06-05 LAB — BASIC METABOLIC PANEL
BUN: 10 mg/dL (ref 6–23)
CALCIUM: 8.8 mg/dL (ref 8.4–10.5)
CHLORIDE: 91 meq/L — AB (ref 96–112)
CREATININE: 0.82 mg/dL (ref 0.50–1.35)
GFR calc Af Amer: >90 mL/min (ref >90)
GFR calc non Af Amer: >90 mL/min (ref >90)
Glucose, Bld: 93 mg/dL (ref 70–99)
Potassium: 3.4 mEq/L — ABNORMAL LOW (ref 3.7–5.3)
Sodium: 145 mEq/L (ref 137–147)

## 2014-06-05 LAB — CBC
HEMATOCRIT: 52.9 % — AB (ref 39.0–52.0)
Hemoglobin: 14.6 g/dL (ref 13.0–17.0)
MCH: 29.7 pg (ref 26.0–34.0)
MCHC: 27.6 g/dL — ABNORMAL LOW (ref 30.0–36.0)
MCV: 107.7 fL — AB (ref 78.0–100.0)
PLATELETS: 227 10*3/uL (ref 150–400)
RBC: 4.91 MIL/uL (ref 4.22–5.81)
RDW: 15.8 % — ABNORMAL HIGH (ref 11.5–15.5)
WBC: 7.9 10*3/uL (ref 4.0–10.5)

## 2014-06-05 MED ORDER — TRAMADOL HCL 50 MG PO TABS: 50.0000 mg | ORAL_TABLET | Freq: Three times a day (TID) | ORAL | Status: DC | PRN

## 2014-06-05 MED ORDER — POTASSIUM CHLORIDE ER 20 MEQ PO TBCR: 20.0000 meq | EXTENDED_RELEASE_TABLET | Freq: Every day | ORAL | Status: AC

## 2014-06-05 MED ORDER — IPRATROPIUM-ALBUTEROL 0.5-2.5 (3) MG/3ML IN SOLN: 3.0000 mL | RESPIRATORY_TRACT | Status: AC | PRN

## 2014-06-05 MED ORDER — LEVOFLOXACIN 500 MG PO TABS: 500.0000 mg | ORAL_TABLET | Freq: Every day | ORAL | Status: DC

## 2014-06-05 NOTE — Progress Notes (Signed)
Clinical Social Work Department CLINICAL SOCIAL WORK PLACEMENT NOTE 06/05/2014  Patient:  Bryce [name],Bryce [name]  Account Number:  [id number] Admit date:  05/31/2014  Clinical Social Worker:  Orpah GreekKELLY FOLEY, LCSWA  Date/time:  06/05/2014 11:12 AM  Clinical Social Work is seeking post-discharge placement for this patient at the following level of care:   SKILLED NURSING   (*CSW will update this form in Epic as items are completed)   06/05/2014  Patient/family provided with [facility] Department of Clinical Social Work's list of facilities offering this level of care within the geographic area requested by the patient (or if unable, by the patient's family).  06/05/2014  Patient/family informed of their freedom to choose among providers that offer the needed level of care, that participate in Medicare, Medicaid or managed care program needed by the patient, have an available bed and are willing to accept the patient.  06/05/2014  Patient/family informed of [facility]' ownership interest in [facility], as well as of the fact that they are under no obligation to receive care at this facility.  PASARR submitted to EDS on 06/05/2014 PASARR number received on 06/05/2014  FL2 transmitted to all facilities in geographic area requested by pt/family on  06/05/2014 FL2 transmitted to all facilities within larger geographic area on   Patient informed that his/her managed care company has contracts with or will negotiate with  certain facilities, including the following:     Patient/family informed of bed offers received:  06/05/2014 [facility] [place] Physician recommends and patient chooses bed at    [facility]  on  06/05/2014 Patient to be transferred to facility by [facility] Patient and family notified of transfer on 06/05/2014 Name of family member notified:  patient's sister, Bryce [name] via phone  The  following physician request were entered in Epic:   Additional Comments:   Lincoln MaxinKelly Harrison, LCSW [facility] Clinical Social Worker cell #: [phone]

## 2014-06-05 NOTE — Discharge Instructions (Signed)
Dressing Change °A dressing is a material placed over wounds. It keeps the wound clean, dry, and protected from further injury. This provides an environment that favors wound healing.  °BEFORE YOU BEGIN °· Get your supplies together. Things you may need include: °¨ Saline solution. °¨ Flexible gauze dressing. °¨ Medicated cream. °¨ Tape. °¨ Gloves. °¨ Abdominal dressing pads. °¨ Gauze squares. °¨ Plastic bags. °· Take pain medicine 30 minutes before the dressing change if you need it. °· Take a shower before you do the first dressing change of the day. Use plastic wrap or a plastic bag to prevent the dressing from getting wet. °REMOVING YOUR OLD DRESSING  °· Wash your hands with soap and water. Dry your hands with a clean towel. °· Put on your gloves. °· Remove any tape. °· Carefully remove the old dressing. If the dressing sticks, you may dampen it with warm water to loosen it, or follow your caregiver's specific directions. °· Remove any gauze or packing tape that is in your wound. °· Take off your gloves. °· Put the gloves, tape, gauze, or any packing tape into a plastic bag. °CHANGING YOUR DRESSING °· Open the supplies. °· Take the cap off the saline solution. °· Open the gauze package so that the gauze remains on the inside of the package. °· Put on your gloves. °· Clean your wound as told by your caregiver. °· If you have been told to keep your wound dry, follow those instructions. °· Your caregiver may tell you to do one or more of the following: °¨ Pick up the gauze. Pour the saline solution over the gauze. Squeeze out the extra saline solution. °¨ Put medicated cream or other medicine on your wound if you have been told to do so. °¨ Put the solution soaked gauze only in your wound, not on the skin around it. °¨ Pack your wound loosely or as told by your caregiver. °¨ Put dry gauze on your wound. °¨ Put abdominal dressing pads over the dry gauze if your wet gauze soaks through. °· Tape the abdominal dressing  pads in place so they will not fall off. Do not wrap the tape completely around the affected part (arm, leg, abdomen). °· Wrap the dressing pads with a flexible gauze dressing to secure it in place. °· Take off your gloves. Put them in the plastic bag with the old dressing. Tie the bag shut and throw it away. °· Keep the dressing clean and dry until your next dressing change. °· Wash your hands. °SEEK MEDICAL CARE IF: °· Your skin around the wound looks red. °· Your wound feels more tender or sore. °· You see pus in the wound. °· Your wound smells bad. °· You have a fever. °· Your skin around the wound has a rash that itches and burns. °· You see black or yellow skin in your wound that was not there before. °· You feel nauseous, throw up, and feel very tired. °Document Released: 01/04/2005 Document Revised: 02/19/2012 Document Reviewed: 10/09/2011 °ExitCare® Patient Information ©2015 [employer]. This information is not intended to replace advice given to you by your health care provider. Make sure you discuss any questions you have with your health care provider. ° °

## 2014-06-05 NOTE — Progress Notes (Signed)
Patient is set to [facility] today. Patient & sister, Bryce RhodesBetty aware. Discharge packet given to RN, Victorino DikePam. [facility] called for transport.   Clinical Social Work Department BRIEF PSYCHOSOCIAL ASSESSMENT 06/05/2014  Patient:  Bryce [name],[name]     Account Number:  [id number]     Admit date:  05/31/2014  Clinical Social Worker:  Orpah GreekFOLEY,KELLY, LCSWA  Date/Time:  06/05/2014 11:08 AM  Referred by:  Physician  Date Referred:  06/05/2014 Referred for  SNF Placement   Other Referral:   Interview type:  Patient Other interview type:   and sister, Bryce RhodesBetty via phone    PSYCHOSOCIAL DATA Living Status:  ALONE Admitted from facility:   Level of care:   Primary support name:  Bryce [name] (sister) ph#: [phone] Primary support relationship to patient:  SIBLING Degree of support available:   good    CURRENT CONCERNS Current Concerns  Post-Acute Placement   Other Concerns:    SOCIAL WORK ASSESSMENT / PLAN CSW received consult that patient is now agreeable with plan for SNF.   Assessment/plan status:  [facility] Other assessment/ plan:   Information/referral to community resources:   CSW completed FL2 and [facility] - provided bed offers.    PATIENT'S/FAMILY'S RESPONSE TO PLAN OF CARE: Patient & sister via phone voiced agreement in plan for SNF, [facility] as it is located closest to where they both live. [facility].     Bryce MaxinKelly Harrison, LCSW [facility] Clinical Social Worker cell #: [phone]

## 2014-06-05 NOTE — Discharge Summary (Signed)
Physician Discharge Summary  Bryce [name]:[id number] DOB: 08/02/1946 DOA: 05/31/2014  PCP: Dorrene German, MD  Admit date: 05/31/2014 Discharge date: 06/05/2014  Recommendations for Outpatient Follow-up:  1. Pt will need to follow up with PCP in 2-3 weeks post discharge 2. Please obtain BMP to evaluate electrolytes and kidney function, potassium level 3. Please also check CBC to evaluate Hg and Hct levels 4. Please note that pt discharged on Levaquin to complete 10 more days of therapy post discharge   Discharge Diagnoses:  Active Problems:   DM   CHF (congestive heart failure)   Cellulitis and abscess of leg   Heart failure  Discharge Condition: Stable  Diet recommendation: Heart healthy diet discussed in details   Brief narrative:  68 y.o. male with history of hypertension, diabetes, anemia, congestive heart failure with significant lower extremity cellulitis and skin maceration, buttock and perirectal area. He was admitted on 05/31/2014 with working diagnosis of chronic wound infection involving multiple areas. Throughout the hospital stay he has refused certain medications and seems to have poor insight about his medical condition and treatment.   Assessment/Plan:  Principal Problem:  Cellulitis and chronic wounds of legs,, buttocks  WOC assessment: Numerous skin issues on bilateral LEs and in perineum secondary to moisture. Also noted is intertriginous dermatitis. Combination ulcer on right posterior thigh from moisture and pressure. Right posterior thigh measuring 9 cm x 6 cm and 0.2 cm. Perineum: Numerous partial thickness and scattered open areas with deeper lesions in the creases. Intertriginous dermatitis with ulcerations (full thickness) at the pannus in an area measuring 6 cm x 3 cm with the largest open area measuring 1 cm x 1.5 cm x 0.2 cm. Bilateral LEs with open areas: the left with an ulcer at the posterior aspect (Achilles Area) measuring 2 cm x 1.5 cm x 0.2 cm  and the right with an area of hypertrophic tissue measuring 10 cm x 20 cm with weeping serous exudate. Dressing recommendations: re-apply Unna's boots, etiology more likely lymphedema than venous insufficiency. Needs more than one per extremity to properly address his size. Use saline dressings on the right posterior thigh and in the deeper crevices in the perineum, topical zinc-oxide based skin protective cream to the scattered open areas on the buttocks due to moisture. Continue therapeutic mattress with low air loss feature. Also needs pressure redistribution heel boots to prevent pressure ulcerations.  Please note that the patient has refused IV medications throughout the hospital course but has finally been started on IV Levaquin 06/02/2014. Patient also took vancomycin on 06/01/2014 but then has refused to take it.  Pt will continue taking Levaquin PO for 10 more days post discharge  Active Problems:  Guaiac-positive stools  Likely secondary to perineal wounds that bleed easily especially as noted by WOC areas exposed to urine and stool which are very friable and bleed even with cleansing  Hemoglobin is WNL; no current indications for transfusion Chronic diastolic congestive heart failure  2 D ECHO in 2014 showed EF in 55%. CHF compensated at this time.  Pro-BNP on this admission is 3281  Initial troponin was 0.18 but subsequent troponin levels x 2 negative  Continue lasix 40 mg daily; potassium supplement provided inpatient and to continue upon discharge  Continue aspirin daily and simvastatin daily Hypokalemia and hypomagnesemia  Supplement daily as pt is on Lasix  Diabetes mellitus  Continued sliding scale insulin  Continue Metformin as per home medical regimen  Hypertension  Continue Catapres 0.2 mg twice a  day, amlodipine 10 mg daily, Aldactone 12.5 mg daily. Iron deficiency anemia  Continue ferrous sulfate 325 mg by mouth daily Severe protein calorie malnutrition  Nutrition consulted   Pt's caretaker spends two hours per day with him and says pt has poor oral intake, no support at home   Code Status: DNR/DNI  Family Communication: Pt and caretaker at bedside  Disposition Plan:SNF  Consultants:  WOC Procedures:  None Antibiotics:  Levaquin 06/02/2014 --> 10 more days post discharge  Vancomycin 06/01/2014 and then 06/04/2014 --> 6/26   Discharge Exam: Filed Vitals:   06/05/14 0429  BP: 145/73  Pulse: 105  Temp: 98.2 F (36.8 C)  Resp: 20   Filed Vitals:   06/04/14 1414 06/04/14 2015 06/05/14 0425 06/05/14 0429  BP: 110/68 149/76  145/73  Pulse: 96 101  105  Temp: 98.1 F (36.7 C) 98.2 F (36.8 C)  98.2 F (36.8 C)  TempSrc: Oral Oral  Oral  Resp: 18 18  20   Height:      Weight:   114.76 kg (253 lb)   SpO2: 98% 96%  99%    General: Pt is alert, follows commands appropriately, not in acute distress Cardiovascular: Regular rate and rhythm, S1/S2 +, no murmurs, no rubs, no gallops Respiratory: Clear to auscultation bilaterally, no wheezing, no crackles, no rhonchi   Discharge Instructions  Discharge Instructions   Diet - low sodium heart healthy    Complete by:  As directed      Increase activity slowly    Complete by:  As directed             Medication List         amLODipine 10 MG tablet  Commonly known as:  NORVASC  Take 10 mg by mouth every morning.     aspirin EC 81 MG tablet  Take 81 mg by mouth every morning.     cloNIDine 0.2 MG tablet  Commonly known as:  CATAPRES  Take 0.2 mg by mouth 2 (two) times daily.     COMBIVENT 18-103 MCG/ACT inhaler  Generic drug:  albuterol-ipratropium  Inhale 1 puff into the lungs every morning.     ipratropium-albuterol 0.5-2.5 (3) MG/3ML Soln  Commonly known as:  DUONEB  Take 3 mLs by nebulization every 4 (four) hours as needed.     ferrous sulfate 325 (65 FE) MG tablet  Take 650 mg by mouth daily with breakfast.     folic acid 1 MG tablet  Commonly known as:  FOLVITE  Take 1 mg by  mouth every morning.     furosemide 40 MG tablet  Commonly known as:  LASIX  Take 40 mg by mouth every morning.     levofloxacin 500 MG tablet  Commonly known as:  LEVAQUIN  Take 1 tablet (500 mg total) by mouth daily.     metFORMIN 500 MG tablet  Commonly known as:  GLUCOPHAGE  Take 500 mg by mouth 2 (two) times daily with a meal.     omeprazole 20 MG capsule  Commonly known as:  PRILOSEC  Take 20 mg by mouth 2 (two) times daily.     Potassium Chloride ER 20 MEQ Tbcr  Take 20 mEq by mouth daily.     pravastatin 40 MG tablet  Commonly known as:  PRAVACHOL  Take 40 mg by mouth every evening.     spironolactone 25 MG tablet  Commonly known as:  ALDACTONE  Take 12.5 mg by mouth every morning.  thiamine 100 MG tablet  Take 100 mg by mouth every morning.     traMADol 50 MG tablet  Commonly known as:  ULTRAM  Take 1-2 tablets (50-100 mg total) by mouth every 8 (eight) hours as needed for moderate pain.     Vitamin D 2000 UNITS tablet  Take 2,000 Units by mouth every morning.           Follow-up Information   Schedule an appointment as soon as possible for a visit with Dorrene German, MD.   Specialty:  Internal Medicine   Contact information:   [address] [place] [address] [phone]        The results of significant diagnostics from this hospitalization (including imaging, microbiology, ancillary and laboratory) are listed below for reference.     Microbiology: Recent Results (from the past 240 hour(s))  CULTURE, BLOOD (ROUTINE X 2)     Status: None   Collection Time    05/29/14 11:52 AM      Result Value Ref Range Status   Specimen Description BLOOD LEFT ARM   Final   Special Requests BOTTLES DRAWN AEROBIC ONLY 5CC   Final   Culture  Setup Time     Final   Value: 05/29/2014 17:42     Performed at [facility]   Culture     Final   Value: NO GROWTH 5 DAYS     Performed at [facility]   Report Status 06/04/2014 FINAL    Final  CULTURE, BLOOD (ROUTINE X 2)     Status: None   Collection Time    05/29/14 12:56 PM      Result Value Ref Range Status   Specimen Description BLOOD LEFT ANTECUBITAL   Final   Special Requests BOTTLES DRAWN AEROBIC AND ANAEROBIC 10CCS   Final   Culture  Setup Time     Final   Value: 05/29/2014 17:43     Performed at [facility]   Culture     Final   Value: NO GROWTH 5 DAYS     Performed at [facility]   Report Status 06/04/2014 FINAL   Final     Labs: Basic Metabolic Panel:  Recent Labs Lab 06/01/14 0757 06/02/14 0446 06/03/14 0504 06/04/14 0119 06/05/14 0501  NA 146 140 143 144 145  K 3.2* 3.6* 3.6* 3.9 3.4*  CL 99 97 98 95* 91*  CO2 40* 39* 37* 42* >45*  GLUCOSE 110* 139* 108* 108* 93  BUN 15 14 15 13 10   CREATININE 0.82 0.88 0.90 0.86 0.82  CALCIUM 7.7* 7.8* 8.1* 8.5 8.8  MG 1.2*  --   --   --   --    Liver Function Tests:  Recent Labs Lab 05/31/14 1718  AST 20  ALT 26  ALKPHOS 96  BILITOT 0.5  PROT 7.0  ALBUMIN 2.4*   No results found for this basename: LIPASE, AMYLASE,  in the last 168 hours No results found for this basename: AMMONIA,  in the last 168 hours CBC:  Recent Labs Lab 05/29/14 1205 05/29/14 1220 05/31/14 1718 06/02/14 1646 06/04/14 0119 06/05/14 0501  WBC 10.1  --  8.8 6.6 6.7 7.9  NEUTROABS 8.3*  --   --   --   --   --   HGB 16.7 19.7* 15.8 13.6 14.2 14.6  HCT 53.2* 58.0* 52.1* 48.5 51.0 52.9*  MCV 99.1  --  100.2* 105.7* 107.4* 107.7*  PLT PLATELET CLUMPS NOTED ON  SMEAR, COUNT APPEARS ADEQUATE  --  244 220 241 227   Cardiac Enzymes:  Recent Labs Lab 05/29/14 1240 06/01/14 0019 06/01/14 0730 06/01/14 1323  TROPONINI <0.30 <0.30 <0.30 <0.30   BNP: BNP (last 3 results)  Recent Labs  07/13/13 1620 05/29/14 1206 05/31/14 1718  PROBNP 1489.0* 3056.0* 3281.0*   CBG:  Recent Labs Lab 06/03/14 2148 06/04/14 0809 06/04/14 1142 06/04/14 1640 06/04/14 2115  GLUCAP 116* 94 88 159* 132*      SIGNED: Time coordinating discharge: Over 30 minutes  Debbora PrestoMAGICK-MYERS, ISKRA, MD  [facility] 06/05/2014, 10:38 AM Pager [phone]  If 7PM-7AM, please contact night-coverage [email or web address] Password [email or web address]

## 2014-06-05 NOTE — Progress Notes (Signed)
Pt refuses to have finger stick for blood sugar, refusing to eat, and will not take any meds.  Very tearful and confused.  Will continue to monitor.

## 2014-06-05 NOTE — Progress Notes (Signed)
NUTRITION FOLLOW UP/CONSULT  Intervention:   -Continue to encourage PO intake and supplement acceptance -May benefit from addition of Glucerna shakes TID if pt continues to refuse meals  Nutrition Dx:   Increased nutrient needs (protein/kcal) related to wound healing as evidenced by estimated nutrition needs, pressure ulcers on bilateral LE- ongoing  Goal:   Pt to meet >/= 90% of their estimated nutrition needs- not met  Monitor:   Total protein/energy intake, labs, weights, skin integrity, supplement acceptance  Assessment:   6/22: -Pt denied any changes in weight or appetite pta. Was very lethargic during time of RD assessment and provided minimal diet recall information. Has appeared to gain 40 lbs over past year per previous medical records  -WOC evaluated pt on 6/22, noted multiple pressure ulcers on bilateral LEs and MASD on perineum  -PO intake approximately 50% of meals. Would benefit from additional protein supplementation to assist in overall skin integrity and wound healing  6/26: -Received consult for further evaluation of pt's nutritional status -Per caretaker, pt has hx of poor PO intake. Pt very disoriented during time of RD follow up, was unable to provide additional food/nutrition information -Is currently refusing meals, supplement and medications per discussion with RN and NT. NT noted that pt wasn't refusing foods on Tuesday 6/22; however was very sleepy and had minimal PO intake, approximately 25% -Pt to be d/c today per RN -Recommend for pt to continue with protein supplements upon d/c, and consider addition of Glucerna shakes TID if pt continues to refuse meals as RD concerned with nutritional status and skin integrity  Height: Ht Readings from Last 1 Encounters:  06/01/14 5' 7"  (1.702 m)    Weight Status:   Wt Readings from Last 1 Encounters:  06/05/14 253 lb (114.76 kg)    Re-estimated needs:  Kcal: 2400-2600  Protein: >/=135 grams  Fluid: >/=2400  ml/daily   Skin: see WOC note on 6/22  Diet Order: General   Intake/Output Summary (Last 24 hours) at 06/05/14 1052 Last data filed at 06/05/14 5885  Gross per 24 hour  Intake    590 ml  Output   2450 ml  Net  -1860 ml    Last BM: 6/25   Labs:   Recent Labs Lab 06/01/14 0757  06/03/14 0504 06/04/14 0119 06/05/14 0501  NA 146  < > 143 144 145  K 3.2*  < > 3.6* 3.9 3.4*  CL 99  < > 98 95* 91*  CO2 40*  < > 37* 42* >45*  BUN 15  < > 15 13 10   CREATININE 0.82  < > 0.90 0.86 0.82  CALCIUM 7.7*  < > 8.1* 8.5 8.8  MG 1.2*  --   --   --   --   GLUCOSE 110*  < > 108* 108* 93  < > = values in this interval not displayed.  CBG (last 3)   Recent Labs  06/04/14 1142 06/04/14 1640 06/04/14 2115  GLUCAP 88 159* 132*    Scheduled Meds: . amLODipine  10 mg Oral q morning - 10a  . antiseptic oral rinse  15 mL Mouth Rinse BID  . aspirin EC  81 mg Oral Daily  . cholecalciferol  2,000 Units Oral q morning - 10a  . cloNIDine  0.2 mg Oral BID  . feeding supplement (PRO-STAT SUGAR FREE 64)  30 mL Oral BID WC  . ferrous sulfate  650 mg Oral QPC breakfast  . folic acid  1 mg Oral q  morning - 10a  . furosemide  40 mg Oral Daily  . insulin aspart  0-9 Units Subcutaneous TID WC  . ipratropium-albuterol  3 mL Nebulization Daily  . levofloxacin (LEVAQUIN) IV  750 mg Intravenous Q24H  . nutrition supplement (JUVEN)  1 packet Oral q morning - 10a  . pantoprazole  40 mg Oral Daily  . simvastatin  5 mg Oral q1800  . sodium chloride  3 mL Intravenous Q12H  . spironolactone  12.5 mg Oral q morning - 10a  . thiamine  100 mg Oral q morning - 10a  . vancomycin  1,250 mg Intravenous Q24H    Continuous Infusions: . sodium chloride 10 mL/hr at 06/01/14 [place] RD LDN Clinical Dietitian [phone]

## 2014-06-09 ENCOUNTER — Non-Acute Institutional Stay (SKILLED_NURSING_FACILITY): Payer: Medicare Other | Admitting: Internal Medicine

## 2014-06-09 ENCOUNTER — Encounter: Payer: Self-pay | Admitting: Internal Medicine

## 2014-06-09 DIAGNOSIS — I798 Other disorders of arteries, arterioles and capillaries in diseases classified elsewhere: Secondary | ICD-10-CM

## 2014-06-09 DIAGNOSIS — L03119 Cellulitis of unspecified part of limb: Secondary | ICD-10-CM

## 2014-06-09 DIAGNOSIS — I159 Secondary hypertension, unspecified: Secondary | ICD-10-CM

## 2014-06-09 DIAGNOSIS — I10 Essential (primary) hypertension: Secondary | ICD-10-CM | POA: Insufficient documentation

## 2014-06-09 DIAGNOSIS — L02419 Cutaneous abscess of limb, unspecified: Secondary | ICD-10-CM

## 2014-06-09 DIAGNOSIS — E785 Hyperlipidemia, unspecified: Secondary | ICD-10-CM

## 2014-06-09 DIAGNOSIS — N400 Enlarged prostate without lower urinary tract symptoms: Secondary | ICD-10-CM | POA: Insufficient documentation

## 2014-06-09 DIAGNOSIS — E1151 Type 2 diabetes mellitus with diabetic peripheral angiopathy without gangrene: Secondary | ICD-10-CM

## 2014-06-09 DIAGNOSIS — I5032 Chronic diastolic (congestive) heart failure: Secondary | ICD-10-CM

## 2014-06-09 DIAGNOSIS — E669 Obesity, unspecified: Secondary | ICD-10-CM

## 2014-06-09 DIAGNOSIS — E1159 Type 2 diabetes mellitus with other circulatory complications: Secondary | ICD-10-CM

## 2014-06-09 DIAGNOSIS — I158 Other secondary hypertension: Secondary | ICD-10-CM
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
## 2014-06-10 ENCOUNTER — Encounter (HOSPITAL_BASED_OUTPATIENT_CLINIC_OR_DEPARTMENT_OTHER): Payer: Medicare Other | Attending: General Surgery

## 2014-06-19 ENCOUNTER — Non-Acute Institutional Stay (SKILLED_NURSING_FACILITY): Payer: Medicare Other | Admitting: Internal Medicine

## 2014-06-19 DIAGNOSIS — M25512 Pain in left shoulder: Secondary | ICD-10-CM

## 2014-06-19 DIAGNOSIS — M25519 Pain in unspecified shoulder: Secondary | ICD-10-CM
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
## 2014-06-22 ENCOUNTER — Other Ambulatory Visit: Payer: Self-pay

## 2014-06-22 MED ORDER — TRAMADOL HCL 50 MG PO TABS: 100.0000 mg | ORAL_TABLET | Freq: Two times a day (BID) | ORAL | Status: DC

## 2014-06-22 NOTE — Telephone Encounter (Signed)
RX faxed to [facility] @ [phone], phone number [phone] 

## 2014-06-26 ENCOUNTER — Encounter: Payer: Self-pay | Admitting: Internal Medicine

## 2014-06-26 ENCOUNTER — Non-Acute Institutional Stay (SKILLED_NURSING_FACILITY): Payer: Medicare Other | Admitting: Internal Medicine

## 2014-06-26 DIAGNOSIS — I5032 Chronic diastolic (congestive) heart failure: Secondary | ICD-10-CM

## 2014-06-26 DIAGNOSIS — F3289 Other specified depressive episodes: Secondary | ICD-10-CM

## 2014-06-26 DIAGNOSIS — E1151 Type 2 diabetes mellitus with diabetic peripheral angiopathy without gangrene: Secondary | ICD-10-CM

## 2014-06-26 DIAGNOSIS — E1159 Type 2 diabetes mellitus with other circulatory complications: Secondary | ICD-10-CM

## 2014-06-26 DIAGNOSIS — I798 Other disorders of arteries, arterioles and capillaries in diseases classified elsewhere: Secondary | ICD-10-CM

## 2014-06-26 DIAGNOSIS — I10 Essential (primary) hypertension: Secondary | ICD-10-CM

## 2014-06-26 DIAGNOSIS — M25512 Pain in left shoulder: Secondary | ICD-10-CM | POA: Insufficient documentation

## 2014-06-26 DIAGNOSIS — F32A Depression, unspecified: Secondary | ICD-10-CM

## 2014-06-26 DIAGNOSIS — F329 Major depressive disorder, single episode, unspecified: Secondary | ICD-10-CM | POA: Insufficient documentation

## 2014-06-26 DIAGNOSIS — M25519 Pain in unspecified shoulder: Secondary | ICD-10-CM

## 2014-06-26 DIAGNOSIS — K219 Gastro-esophageal reflux disease without esophagitis: Secondary | ICD-10-CM
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
## 2014-11-19 ENCOUNTER — Encounter (HOSPITAL_BASED_OUTPATIENT_CLINIC_OR_DEPARTMENT_OTHER): Payer: Medicare Other

## 2014-12-14 ENCOUNTER — Encounter (HOSPITAL_BASED_OUTPATIENT_CLINIC_OR_DEPARTMENT_OTHER): Payer: Commercial Managed Care - HMO | Attending: Plastic Surgery

## 2014-12-14 DIAGNOSIS — L97819 Non-pressure chronic ulcer of other part of right lower leg with unspecified severity: Secondary | ICD-10-CM | POA: Insufficient documentation

## 2014-12-14 DIAGNOSIS — L97829 Non-pressure chronic ulcer of other part of left lower leg with unspecified severity: Secondary | ICD-10-CM | POA: Insufficient documentation

## 2014-12-14 DIAGNOSIS — I87333 Chronic venous hypertension (idiopathic) with ulcer and inflammation of bilateral lower extremity: Secondary | ICD-10-CM | POA: Insufficient documentation

## 2014-12-14 DIAGNOSIS — I890 Lymphedema, not elsewhere classified: Secondary | ICD-10-CM | POA: Insufficient documentation

## 2014-12-25 DIAGNOSIS — L97829 Non-pressure chronic ulcer of other part of left lower leg with unspecified severity: Secondary | ICD-10-CM | POA: Diagnosis not present

## 2014-12-25 DIAGNOSIS — L97819 Non-pressure chronic ulcer of other part of right lower leg with unspecified severity: Secondary | ICD-10-CM | POA: Diagnosis not present

## 2014-12-25 DIAGNOSIS — I890 Lymphedema, not elsewhere classified: Secondary | ICD-10-CM | POA: Diagnosis not present

## 2014-12-25 DIAGNOSIS — I87333 Chronic venous hypertension (idiopathic) with ulcer and inflammation of bilateral lower extremity: Secondary | ICD-10-CM | POA: Diagnosis not present
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
## 2014-12-27 ENCOUNTER — Other Ambulatory Visit: Payer: Self-pay | Admitting: Internal Medicine

## 2015-01-15 ENCOUNTER — Encounter (HOSPITAL_BASED_OUTPATIENT_CLINIC_OR_DEPARTMENT_OTHER): Payer: Commercial Managed Care - HMO | Attending: Internal Medicine

## 2015-01-15 DIAGNOSIS — L97221 Non-pressure chronic ulcer of left calf limited to breakdown of skin: Secondary | ICD-10-CM | POA: Insufficient documentation

## 2015-01-15 DIAGNOSIS — I890 Lymphedema, not elsewhere classified: Secondary | ICD-10-CM | POA: Diagnosis not present

## 2015-01-15 DIAGNOSIS — L97111 Non-pressure chronic ulcer of right thigh limited to breakdown of skin: Secondary | ICD-10-CM | POA: Insufficient documentation

## 2015-01-15 DIAGNOSIS — L97121 Non-pressure chronic ulcer of left thigh limited to breakdown of skin: Secondary | ICD-10-CM | POA: Insufficient documentation

## 2015-01-15 DIAGNOSIS — L97211 Non-pressure chronic ulcer of right calf limited to breakdown of skin: Secondary | ICD-10-CM | POA: Diagnosis not present

## 2015-01-15 DIAGNOSIS — I87333 Chronic venous hypertension (idiopathic) with ulcer and inflammation of bilateral lower extremity: Secondary | ICD-10-CM | POA: Diagnosis present

## 2015-01-15 LAB — GLUCOSE, CAPILLARY: GLUCOSE-CAPILLARY: 94 mg/dL (ref 70–99)

## 2015-01-22 DIAGNOSIS — L97121 Non-pressure chronic ulcer of left thigh limited to breakdown of skin: Secondary | ICD-10-CM | POA: Diagnosis not present

## 2015-01-22 DIAGNOSIS — I87333 Chronic venous hypertension (idiopathic) with ulcer and inflammation of bilateral lower extremity: Secondary | ICD-10-CM | POA: Diagnosis not present

## 2015-01-22 DIAGNOSIS — L97111 Non-pressure chronic ulcer of right thigh limited to breakdown of skin: Secondary | ICD-10-CM | POA: Diagnosis not present

## 2015-01-22 DIAGNOSIS — L97211 Non-pressure chronic ulcer of right calf limited to breakdown of skin: Secondary | ICD-10-CM | POA: Diagnosis not present

## 2015-02-05 DIAGNOSIS — L97211 Non-pressure chronic ulcer of right calf limited to breakdown of skin: Secondary | ICD-10-CM | POA: Diagnosis not present

## 2015-02-05 DIAGNOSIS — I87333 Chronic venous hypertension (idiopathic) with ulcer and inflammation of bilateral lower extremity: Secondary | ICD-10-CM | POA: Diagnosis not present

## 2015-02-05 DIAGNOSIS — L97121 Non-pressure chronic ulcer of left thigh limited to breakdown of skin: Secondary | ICD-10-CM | POA: Diagnosis not present

## 2015-02-05 DIAGNOSIS — L97111 Non-pressure chronic ulcer of right thigh limited to breakdown of skin: Secondary | ICD-10-CM | POA: Diagnosis not present

## 2015-02-15 ENCOUNTER — Encounter (HOSPITAL_COMMUNITY): Payer: Self-pay | Admitting: *Deleted

## 2015-02-15 ENCOUNTER — Inpatient Hospital Stay (HOSPITAL_COMMUNITY): Payer: Commercial Managed Care - HMO

## 2015-02-15 ENCOUNTER — Inpatient Hospital Stay (HOSPITAL_COMMUNITY)
Admission: EM | Admit: 2015-02-15 | Discharge: 2015-03-12 | DRG: 870 | Disposition: E | Payer: Commercial Managed Care - HMO | Attending: Internal Medicine | Admitting: Internal Medicine

## 2015-02-15 ENCOUNTER — Emergency Department (HOSPITAL_COMMUNITY): Payer: Commercial Managed Care - HMO

## 2015-02-15 DIAGNOSIS — R197 Diarrhea, unspecified: Secondary | ICD-10-CM | POA: Diagnosis present

## 2015-02-15 DIAGNOSIS — I272 Other secondary pulmonary hypertension: Secondary | ICD-10-CM | POA: Diagnosis present

## 2015-02-15 DIAGNOSIS — E669 Obesity, unspecified: Secondary | ICD-10-CM | POA: Diagnosis present

## 2015-02-15 DIAGNOSIS — Z22322 Carrier or suspected carrier of Methicillin resistant Staphylococcus aureus: Secondary | ICD-10-CM | POA: Diagnosis not present

## 2015-02-15 DIAGNOSIS — N170 Acute kidney failure with tubular necrosis: Secondary | ICD-10-CM | POA: Diagnosis present

## 2015-02-15 DIAGNOSIS — K611 Rectal abscess: Secondary | ICD-10-CM | POA: Diagnosis present

## 2015-02-15 DIAGNOSIS — Z7982 Long term (current) use of aspirin: Secondary | ICD-10-CM

## 2015-02-15 DIAGNOSIS — R451 Restlessness and agitation: Secondary | ICD-10-CM | POA: Diagnosis not present

## 2015-02-15 DIAGNOSIS — I509 Heart failure, unspecified: Secondary | ICD-10-CM | POA: Diagnosis not present

## 2015-02-15 DIAGNOSIS — Z515 Encounter for palliative care: Secondary | ICD-10-CM

## 2015-02-15 DIAGNOSIS — J9690 Respiratory failure, unspecified, unspecified whether with hypoxia or hypercapnia: Secondary | ICD-10-CM

## 2015-02-15 DIAGNOSIS — E872 Acidosis: Secondary | ICD-10-CM | POA: Diagnosis present

## 2015-02-15 DIAGNOSIS — Z9981 Dependence on supplemental oxygen: Secondary | ICD-10-CM

## 2015-02-15 DIAGNOSIS — IMO0001 Reserved for inherently not codable concepts without codable children: Secondary | ICD-10-CM | POA: Insufficient documentation

## 2015-02-15 DIAGNOSIS — A4159 Other Gram-negative sepsis: Principal | ICD-10-CM | POA: Diagnosis present

## 2015-02-15 DIAGNOSIS — I890 Lymphedema, not elsewhere classified: Secondary | ICD-10-CM | POA: Diagnosis present

## 2015-02-15 DIAGNOSIS — Z833 Family history of diabetes mellitus: Secondary | ICD-10-CM

## 2015-02-15 DIAGNOSIS — I5032 Chronic diastolic (congestive) heart failure: Secondary | ICD-10-CM | POA: Diagnosis present

## 2015-02-15 DIAGNOSIS — L97829 Non-pressure chronic ulcer of other part of left lower leg with unspecified severity: Secondary | ICD-10-CM | POA: Diagnosis present

## 2015-02-15 DIAGNOSIS — N179 Acute kidney failure, unspecified: Secondary | ICD-10-CM | POA: Diagnosis present

## 2015-02-15 DIAGNOSIS — Z66 Do not resuscitate: Secondary | ICD-10-CM | POA: Diagnosis not present

## 2015-02-15 DIAGNOSIS — L03119 Cellulitis of unspecified part of limb: Secondary | ICD-10-CM | POA: Diagnosis not present

## 2015-02-15 DIAGNOSIS — L97819 Non-pressure chronic ulcer of other part of right lower leg with unspecified severity: Secondary | ICD-10-CM | POA: Diagnosis present

## 2015-02-15 DIAGNOSIS — F17210 Nicotine dependence, cigarettes, uncomplicated: Secondary | ICD-10-CM | POA: Diagnosis present

## 2015-02-15 DIAGNOSIS — I129 Hypertensive chronic kidney disease with stage 1 through stage 4 chronic kidney disease, or unspecified chronic kidney disease: Secondary | ICD-10-CM | POA: Diagnosis present

## 2015-02-15 DIAGNOSIS — Z4659 Encounter for fitting and adjustment of other gastrointestinal appliance and device: Secondary | ICD-10-CM

## 2015-02-15 DIAGNOSIS — N189 Chronic kidney disease, unspecified: Secondary | ICD-10-CM | POA: Diagnosis present

## 2015-02-15 DIAGNOSIS — I878 Other specified disorders of veins: Secondary | ICD-10-CM | POA: Diagnosis present

## 2015-02-15 DIAGNOSIS — E785 Hyperlipidemia, unspecified: Secondary | ICD-10-CM | POA: Diagnosis present

## 2015-02-15 DIAGNOSIS — W19XXXA Unspecified fall, initial encounter: Secondary | ICD-10-CM | POA: Diagnosis present

## 2015-02-15 DIAGNOSIS — G9340 Encephalopathy, unspecified: Secondary | ICD-10-CM | POA: Diagnosis present

## 2015-02-15 DIAGNOSIS — L02415 Cutaneous abscess of right lower limb: Secondary | ICD-10-CM | POA: Diagnosis present

## 2015-02-15 DIAGNOSIS — L03317 Cellulitis of buttock: Secondary | ICD-10-CM | POA: Diagnosis present

## 2015-02-15 DIAGNOSIS — D649 Anemia, unspecified: Secondary | ICD-10-CM | POA: Diagnosis present

## 2015-02-15 DIAGNOSIS — Z79899 Other long term (current) drug therapy: Secondary | ICD-10-CM

## 2015-02-15 DIAGNOSIS — L03115 Cellulitis of right lower limb: Secondary | ICD-10-CM | POA: Diagnosis present

## 2015-02-15 DIAGNOSIS — R740 Nonspecific elevation of levels of transaminase and lactic acid dehydrogenase [LDH]: Secondary | ICD-10-CM | POA: Diagnosis present

## 2015-02-15 DIAGNOSIS — J9601 Acute respiratory failure with hypoxia: Secondary | ICD-10-CM | POA: Diagnosis present

## 2015-02-15 DIAGNOSIS — Z6833 Body mass index (BMI) 33.0-33.9, adult: Secondary | ICD-10-CM | POA: Diagnosis not present

## 2015-02-15 DIAGNOSIS — K7290 Hepatic failure, unspecified without coma: Secondary | ICD-10-CM | POA: Diagnosis present

## 2015-02-15 DIAGNOSIS — E871 Hypo-osmolality and hyponatremia: Secondary | ICD-10-CM | POA: Diagnosis present

## 2015-02-15 DIAGNOSIS — E1151 Type 2 diabetes mellitus with diabetic peripheral angiopathy without gangrene: Secondary | ICD-10-CM | POA: Diagnosis present

## 2015-02-15 DIAGNOSIS — N400 Enlarged prostate without lower urinary tract symptoms: Secondary | ICD-10-CM | POA: Diagnosis present

## 2015-02-15 DIAGNOSIS — I10 Essential (primary) hypertension: Secondary | ICD-10-CM | POA: Diagnosis present

## 2015-02-15 DIAGNOSIS — L03116 Cellulitis of left lower limb: Secondary | ICD-10-CM | POA: Diagnosis present

## 2015-02-15 DIAGNOSIS — A419 Sepsis, unspecified organism: Secondary | ICD-10-CM | POA: Diagnosis not present

## 2015-02-15 DIAGNOSIS — Z7951 Long term (current) use of inhaled steroids: Secondary | ICD-10-CM

## 2015-02-15 DIAGNOSIS — Z993 Dependence on wheelchair: Secondary | ICD-10-CM

## 2015-02-15 DIAGNOSIS — R159 Full incontinence of feces: Secondary | ICD-10-CM | POA: Diagnosis present

## 2015-02-15 DIAGNOSIS — L02419 Cutaneous abscess of limb, unspecified: Secondary | ICD-10-CM | POA: Diagnosis present

## 2015-02-15 DIAGNOSIS — L0390 Cellulitis, unspecified: Secondary | ICD-10-CM | POA: Insufficient documentation

## 2015-02-15 DIAGNOSIS — Z880 Allergy status to penicillin: Secondary | ICD-10-CM

## 2015-02-15 DIAGNOSIS — R0902 Hypoxemia: Secondary | ICD-10-CM

## 2015-02-15 DIAGNOSIS — J811 Chronic pulmonary edema: Secondary | ICD-10-CM

## 2015-02-15 DIAGNOSIS — R6521 Severe sepsis with septic shock: Secondary | ICD-10-CM | POA: Diagnosis present

## 2015-02-15 DIAGNOSIS — R34 Anuria and oliguria: Secondary | ICD-10-CM | POA: Diagnosis present

## 2015-02-15 DIAGNOSIS — R748 Abnormal levels of other serum enzymes: Secondary | ICD-10-CM | POA: Insufficient documentation

## 2015-02-15 DIAGNOSIS — J9600 Acute respiratory failure, unspecified whether with hypoxia or hypercapnia: Secondary | ICD-10-CM

## 2015-02-15 DIAGNOSIS — Z9289 Personal history of other medical treatment: Secondary | ICD-10-CM

## 2015-02-15 LAB — CBC WITH DIFFERENTIAL/PLATELET
Basophils Absolute: 0 10*3/uL (ref 0.0–0.1)
Basophils Relative: 0 % (ref 0–1)
Eosinophils Absolute: 0 10*3/uL (ref 0.0–0.7)
Eosinophils Relative: 0 % (ref 0–5)
HCT: 46.2 % (ref 39.0–52.0)
Hemoglobin: 15.3 g/dL (ref 13.0–17.0)
LYMPHS PCT: 3 % — AB (ref 12–46)
Lymphs Abs: 0.4 10*3/uL — ABNORMAL LOW (ref 0.7–4.0)
MCH: 30.1 pg (ref 26.0–34.0)
MCHC: 33.1 g/dL (ref 30.0–36.0)
MCV: 90.9 fL (ref 78.0–100.0)
Monocytes Absolute: 1.2 10*3/uL — ABNORMAL HIGH (ref 0.1–1.0)
Monocytes Relative: 9 % (ref 3–12)
Neutro Abs: 11.6 10*3/uL — ABNORMAL HIGH (ref 1.7–7.7)
Neutrophils Relative %: 88 % — ABNORMAL HIGH (ref 43–77)
Platelets: 222 10*3/uL (ref 150–400)
RBC: 5.08 MIL/uL (ref 4.22–5.81)
RDW: 16.6 % — AB (ref 11.5–15.5)
WBC: 13.2 10*3/uL — ABNORMAL HIGH (ref 4.0–10.5)

## 2015-02-15 LAB — COMPREHENSIVE METABOLIC PANEL
ALT: 203 U/L — AB (ref 0–53)
AST: 57 U/L — AB (ref 0–37)
Albumin: 2.1 g/dL — ABNORMAL LOW (ref 3.5–5.2)
Alkaline Phosphatase: 112 U/L (ref 39–117)
Anion gap: 22 — ABNORMAL HIGH (ref 5–15)
BUN: 128 mg/dL — AB (ref 6–23)
CHLORIDE: 88 mmol/L — AB (ref 96–112)
CO2: 21 mmol/L (ref 19–32)
CREATININE: 6.53 mg/dL — AB (ref 0.50–1.35)
Calcium: 8 mg/dL — ABNORMAL LOW (ref 8.4–10.5)
GFR calc Af Amer: 9 mL/min — ABNORMAL LOW (ref >90)
GFR calc non Af Amer: 8 mL/min — ABNORMAL LOW (ref >90)
Glucose, Bld: 168 mg/dL — ABNORMAL HIGH (ref 70–99)
POTASSIUM: 3.9 mmol/L (ref 3.5–5.1)
Sodium: 131 mmol/L — ABNORMAL LOW (ref 135–145)
Total Bilirubin: 6.1 mg/dL — ABNORMAL HIGH (ref 0.3–1.2)
Total Protein: 7.3 g/dL (ref 6.0–8.3)

## 2015-02-15 LAB — I-STAT CG4 LACTIC ACID, ED: LACTIC ACID, VENOUS: 3.88 mmol/L — AB (ref 0.5–2.0)

## 2015-02-15 LAB — GLUCOSE, CAPILLARY: Glucose-Capillary: 135 mg/dL — ABNORMAL HIGH (ref 70–99)

## 2015-02-15 LAB — BRAIN NATRIURETIC PEPTIDE: B Natriuretic Peptide: 2107.7 pg/mL — ABNORMAL HIGH (ref 0.0–100.0)

## 2015-02-15 LAB — MRSA PCR SCREENING: MRSA by PCR: POSITIVE — AB

## 2015-02-15 LAB — LACTIC ACID, PLASMA
LACTIC ACID, VENOUS: 3.9 mmol/L — AB (ref 0.5–2.0)
Lactic Acid, Venous: 2.6 mmol/L (ref 0.5–2.0)

## 2015-02-15 MED ORDER — ALBUTEROL SULFATE (2.5 MG/3ML) 0.083% IN NEBU
5.0000 mg | INHALATION_SOLUTION | Freq: Once | RESPIRATORY_TRACT | Status: AC
  Administered 2015-02-15: 5 mg via RESPIRATORY_TRACT
  Filled 2015-02-15: qty 6

## 2015-02-15 MED ORDER — DOCUSATE SODIUM 100 MG PO CAPS
100.0000 mg | ORAL_CAPSULE | Freq: Two times a day (BID) | ORAL | Status: DC
  Administered 2015-02-15: 100 mg via ORAL
  Filled 2015-02-15 (×5): qty 1

## 2015-02-15 MED ORDER — ACETAMINOPHEN 650 MG RE SUPP: 650.0000 mg | RECTAL | Status: DC | PRN

## 2015-02-15 MED ORDER — SODIUM CHLORIDE 0.9 % IV SOLN: INTRAVENOUS | Status: DC

## 2015-02-15 MED ORDER — BISACODYL 10 MG RE SUPP: 10.0000 mg | Freq: Every day | RECTAL | Status: DC | PRN

## 2015-02-15 MED ORDER — OXYCODONE HCL 5 MG PO TABS: 5.0000 mg | ORAL_TABLET | ORAL | Status: DC | PRN

## 2015-02-15 MED ORDER — THIAMINE HCL 100 MG PO TABS: 100.0000 mg | ORAL_TABLET | Freq: Every morning | ORAL | Status: DC

## 2015-02-15 MED ORDER — ASPIRIN EC 81 MG PO TBEC
81.0000 mg | DELAYED_RELEASE_TABLET | Freq: Every morning | ORAL | Status: DC
  Administered 2015-02-17 – 2015-03-01 (×13): 81 mg via ORAL
  Filled 2015-02-15 (×15): qty 1

## 2015-02-15 MED ORDER — SODIUM CHLORIDE 0.9 % IJ SOLN
3.0000 mL | Freq: Two times a day (BID) | INTRAMUSCULAR | Status: DC
  Administered 2015-02-16 – 2015-02-17 (×2): 3 mL via INTRAVENOUS

## 2015-02-15 MED ORDER — SODIUM CHLORIDE 0.9 % IV BOLUS (SEPSIS)
1000.0000 mL | Freq: Once | INTRAVENOUS | Status: AC
  Administered 2015-02-15: 1000 mL via INTRAVENOUS

## 2015-02-15 MED ORDER — HEPARIN SODIUM (PORCINE) 5000 UNIT/ML IJ SOLN
5000.0000 [IU] | Freq: Three times a day (TID) | INTRAMUSCULAR | Status: DC
  Administered 2015-02-15 – 2015-02-20 (×13): 5000 [IU] via SUBCUTANEOUS
  Filled 2015-02-15 (×19): qty 1

## 2015-02-15 MED ORDER — SENNA 8.6 MG PO TABS
1.0000 | ORAL_TABLET | Freq: Two times a day (BID) | ORAL | Status: DC
  Administered 2015-02-15 – 2015-03-01 (×16): 8.6 mg via ORAL
  Filled 2015-02-15 (×29): qty 1

## 2015-02-15 MED ORDER — SODIUM CHLORIDE 0.9 % IV BOLUS (SEPSIS)
500.0000 mL | Freq: Once | INTRAVENOUS | Status: AC
  Administered 2015-02-15: 500 mL via INTRAVENOUS

## 2015-02-15 MED ORDER — INSULIN ASPART 100 UNIT/ML ~~LOC~~ SOLN
0.0000 [IU] | Freq: Three times a day (TID) | SUBCUTANEOUS | Status: DC
  Administered 2015-02-16 (×2): 3 [IU] via SUBCUTANEOUS

## 2015-02-15 MED ORDER — VANCOMYCIN HCL 10 G IV SOLR
2000.0000 mg | Freq: Once | INTRAVENOUS | Status: AC
  Administered 2015-02-15: 2000 mg via INTRAVENOUS
  Filled 2015-02-15: qty 2000

## 2015-02-15 MED ORDER — IPRATROPIUM BROMIDE 0.02 % IN SOLN
0.5000 mg | Freq: Once | RESPIRATORY_TRACT | Status: AC
  Administered 2015-02-15: 0.5 mg via RESPIRATORY_TRACT
  Filled 2015-02-15: qty 2.5

## 2015-02-15 MED ORDER — VITAMIN B-1 100 MG PO TABS
100.0000 mg | ORAL_TABLET | Freq: Every day | ORAL | Status: DC
  Administered 2015-02-15 – 2015-02-27 (×12): 100 mg via ORAL
  Filled 2015-02-15 (×14): qty 1

## 2015-02-15 MED ORDER — ONDANSETRON HCL 4 MG PO TABS: 4.0000 mg | ORAL_TABLET | Freq: Four times a day (QID) | ORAL | Status: DC | PRN

## 2015-02-15 MED ORDER — ACETAMINOPHEN 325 MG PO TABS: 650.0000 mg | ORAL_TABLET | Freq: Four times a day (QID) | ORAL | Status: DC | PRN

## 2015-02-15 MED ORDER — ONDANSETRON HCL 4 MG/2ML IJ SOLN: 4.0000 mg | Freq: Four times a day (QID) | INTRAMUSCULAR | Status: DC | PRN

## 2015-02-15 MED ORDER — PIPERACILLIN-TAZOBACTAM 3.375 G IVPB 30 MIN
3.3750 g | INTRAVENOUS | Status: AC
  Administered 2015-02-15: 3.375 g via INTRAVENOUS
  Filled 2015-02-15: qty 50

## 2015-02-15 MED ORDER — IPRATROPIUM-ALBUTEROL 0.5-2.5 (3) MG/3ML IN SOLN: 3.0000 mL | RESPIRATORY_TRACT | Status: DC | PRN

## 2015-02-15 MED ORDER — ALBUTEROL SULFATE (2.5 MG/3ML) 0.083% IN NEBU: 2.5000 mg | INHALATION_SOLUTION | RESPIRATORY_TRACT | Status: DC | PRN

## 2015-02-15 MED ORDER — FOLIC ACID 1 MG PO TABS
1.0000 mg | ORAL_TABLET | Freq: Every morning | ORAL | Status: DC
  Administered 2015-02-17 – 2015-02-27 (×11): 1 mg via ORAL
  Filled 2015-02-15 (×13): qty 1

## 2015-02-15 MED ORDER — PIPERACILLIN-TAZOBACTAM IN DEX 2-0.25 GM/50ML IV SOLN
2.2500 g | Freq: Four times a day (QID) | INTRAVENOUS | Status: DC
  Administered 2015-02-15 – 2015-02-16 (×2): 2.25 g via INTRAVENOUS
  Filled 2015-02-15 (×6): qty 50

## 2015-02-15 NOTE — ED Notes (Signed)
Per [facility] pt fell at home while family was trying to get his bandages changed.  Family couldn't get him up so they called EMS.  Family and pt stated that 20 mins prior to arrival pt passed a large bright red stool.  VS are as follows: 88/50 HR:100, CBG: 195

## 2015-02-15 NOTE — ED Notes (Signed)
Pt was informed about need of urine. Pt stated he was unable to give urine at this time
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
## 2015-02-15 NOTE — Progress Notes (Signed)
[facility]
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
## 2015-02-15 NOTE — Progress Notes (Signed)
Pt has orders for foley catheter. Educated pt on importance of keeping skin dry and preventing further skin breakdown but pt is refusing at this time.

## 2015-02-15 NOTE — ED Notes (Signed)
Lactic acid value reported by lab: 3.9

## 2015-02-15 NOTE — Progress Notes (Signed)
eLink Physician-Brief Progress Note Patient Name: Bryce MountJoseph P [name] DOB: 01/03/1946 MRN: [id number]   Date of Service  02/16/2015  HPI/Events of Note  Admitted with cellulitis and severe sepsis, receiving resuscitation  eICU Interventions  Repeat lactate now, look for clearance     Intervention Category Intermediate Interventions: Other:  Lylliana Kitamura S. 02/25/2015, 6:05 PM

## 2015-02-15 NOTE — Progress Notes (Signed)
Attempted to take report on pt. Bryce GrieveBryan, RN in ED was unable to give report at this time, in MRI with a pt. Gave callback number, waiting for call back.
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
## 2015-02-16 ENCOUNTER — Inpatient Hospital Stay (HOSPITAL_COMMUNITY): Payer: Commercial Managed Care - HMO

## 2015-02-16 DIAGNOSIS — R740 Nonspecific elevation of levels of transaminase and lactic acid dehydrogenase [LDH]: Secondary | ICD-10-CM

## 2015-02-16 DIAGNOSIS — R748 Abnormal levels of other serum enzymes: Secondary | ICD-10-CM | POA: Insufficient documentation

## 2015-02-16 DIAGNOSIS — N170 Acute kidney failure with tubular necrosis: Secondary | ICD-10-CM

## 2015-02-16 LAB — URINALYSIS, ROUTINE W REFLEX MICROSCOPIC
Glucose, UA: 100 mg/dL — AB
Ketones, ur: NEGATIVE mg/dL
NITRITE: NEGATIVE
SPECIFIC GRAVITY, URINE: 1.022 (ref 1.005–1.030)
UROBILINOGEN UA: 1 mg/dL (ref 0.0–1.0)
pH: 5 (ref 5.0–8.0)

## 2015-02-16 LAB — GLUCOSE, CAPILLARY
GLUCOSE-CAPILLARY: 178 mg/dL — AB (ref 70–99)
Glucose-Capillary: 120 mg/dL — ABNORMAL HIGH (ref 70–99)
Glucose-Capillary: 156 mg/dL — ABNORMAL HIGH (ref 70–99)
Glucose-Capillary: 96 mg/dL (ref 70–99)

## 2015-02-16 LAB — POCT I-STAT 3, ART BLOOD GAS (G3+)
ACID-BASE DEFICIT: 10 mmol/L — AB (ref 0.0–2.0)
Bicarbonate: 19.5 mEq/L — ABNORMAL LOW (ref 20.0–24.0)
O2 SAT: 99 %
PO2 ART: 187 mmHg — AB (ref 80.0–100.0)
Patient temperature: 97.8
TCO2: 21 mmol/L (ref 0–100)
pCO2 arterial: 53.2 mmHg — ABNORMAL HIGH (ref 35.0–45.0)
pH, Arterial: 7.169 — CL (ref 7.350–7.450)

## 2015-02-16 LAB — BLOOD GAS, ARTERIAL
Acid-base deficit: 8.8 mmol/L — ABNORMAL HIGH (ref 0.0–2.0)
BICARBONATE: 16.6 meq/L — AB (ref 20.0–24.0)
Drawn by: 242311
FIO2: 0.5 %
O2 Saturation: 95.3 %
PATIENT TEMPERATURE: 98.6
PEEP/CPAP: 5 cmH2O
PH ART: 7.283 — AB (ref 7.350–7.450)
RATE: 35 resp/min
TCO2: 17.7 mmol/L (ref 0–100)
VT: 530 mL
pCO2 arterial: 36.2 mmHg (ref 35.0–45.0)
pO2, Arterial: 91.9 mmHg (ref 80.0–100.0)

## 2015-02-16 LAB — COMPREHENSIVE METABOLIC PANEL
ALT: 167 U/L — ABNORMAL HIGH (ref 0–53)
ANION GAP: 18 — AB (ref 5–15)
AST: 56 U/L — ABNORMAL HIGH (ref 0–37)
Albumin: 2 g/dL — ABNORMAL LOW (ref 3.5–5.2)
Alkaline Phosphatase: 100 U/L (ref 39–117)
BUN: 127 mg/dL — ABNORMAL HIGH (ref 6–23)
CO2: 21 mmol/L (ref 19–32)
CREATININE: 6.81 mg/dL — AB (ref 0.50–1.35)
Calcium: 7.9 mg/dL — ABNORMAL LOW (ref 8.4–10.5)
Chloride: 93 mmol/L — ABNORMAL LOW (ref 96–112)
GFR, EST AFRICAN AMERICAN: 9 mL/min — AB (ref >90)
GFR, EST NON AFRICAN AMERICAN: 7 mL/min — AB (ref >90)
GLUCOSE: 146 mg/dL — AB (ref 70–99)
Potassium: 4 mmol/L (ref 3.5–5.1)
SODIUM: 132 mmol/L — AB (ref 135–145)
TOTAL PROTEIN: 8.5 g/dL — AB (ref 6.0–8.3)
Total Bilirubin: 6.6 mg/dL — ABNORMAL HIGH (ref 0.3–1.2)

## 2015-02-16 LAB — BASIC METABOLIC PANEL
Anion gap: 21 — ABNORMAL HIGH (ref 5–15)
BUN: 126 mg/dL — AB (ref 6–23)
CHLORIDE: 92 mmol/L — AB (ref 96–112)
CO2: 19 mmol/L (ref 19–32)
Calcium: 7.8 mg/dL — ABNORMAL LOW (ref 8.4–10.5)
Creatinine, Ser: 6.51 mg/dL — ABNORMAL HIGH (ref 0.50–1.35)
GFR calc Af Amer: 9 mL/min — ABNORMAL LOW (ref >90)
GFR calc non Af Amer: 8 mL/min — ABNORMAL LOW (ref >90)
GLUCOSE: 157 mg/dL — AB (ref 70–99)
Potassium: 3.3 mmol/L — ABNORMAL LOW (ref 3.5–5.1)
Sodium: 132 mmol/L — ABNORMAL LOW (ref 135–145)

## 2015-02-16 LAB — TROPONIN I
Troponin I: 0.15 ng/mL — ABNORMAL HIGH (ref <0.031)
Troponin I: 0.15 ng/mL — ABNORMAL HIGH (ref <0.031)
Troponin I: 0.16 ng/mL — ABNORMAL HIGH (ref <0.031)

## 2015-02-16 LAB — RENAL FUNCTION PANEL
ANION GAP: 18 — AB (ref 5–15)
Albumin: 1.8 g/dL — ABNORMAL LOW (ref 3.5–5.2)
BUN: 130 mg/dL — ABNORMAL HIGH (ref 6–23)
CHLORIDE: 93 mmol/L — AB (ref 96–112)
CO2: 18 mmol/L — ABNORMAL LOW (ref 19–32)
Calcium: 7.9 mg/dL — ABNORMAL LOW (ref 8.4–10.5)
Creatinine, Ser: 6.77 mg/dL — ABNORMAL HIGH (ref 0.50–1.35)
GFR calc Af Amer: 9 mL/min — ABNORMAL LOW (ref >90)
GFR, EST NON AFRICAN AMERICAN: 8 mL/min — AB (ref >90)
Glucose, Bld: 140 mg/dL — ABNORMAL HIGH (ref 70–99)
PHOSPHORUS: 10 mg/dL — AB (ref 2.3–4.6)
POTASSIUM: 3.2 mmol/L — AB (ref 3.5–5.1)
Sodium: 129 mmol/L — ABNORMAL LOW (ref 135–145)

## 2015-02-16 LAB — PROTEIN / CREATININE RATIO, URINE
Creatinine, Urine: 113.2 mg/dL
Protein Creatinine Ratio: 6.84 — ABNORMAL HIGH (ref 0.00–0.15)
Total Protein, Urine: 774 mg/dL

## 2015-02-16 LAB — CBC
HCT: 43.8 % (ref 39.0–52.0)
Hemoglobin: 14.3 g/dL (ref 13.0–17.0)
MCH: 29.3 pg (ref 26.0–34.0)
MCHC: 32.6 g/dL (ref 30.0–36.0)
MCV: 89.8 fL (ref 78.0–100.0)
PLATELETS: 257 10*3/uL (ref 150–400)
RBC: 4.88 MIL/uL (ref 4.22–5.81)
RDW: 16.9 % — AB (ref 11.5–15.5)
WBC: 13.4 10*3/uL — AB (ref 4.0–10.5)

## 2015-02-16 LAB — CREATININE, URINE, RANDOM: CREATININE, URINE: 114.09 mg/dL

## 2015-02-16 LAB — URINE MICROSCOPIC-ADD ON

## 2015-02-16 LAB — PROCALCITONIN: Procalcitonin: 12.09 ng/mL

## 2015-02-16 LAB — SODIUM, URINE, RANDOM: SODIUM UR: 31 mmol/L

## 2015-02-16 LAB — LACTIC ACID, PLASMA: LACTIC ACID, VENOUS: 3.4 mmol/L — AB (ref 0.5–2.0)

## 2015-02-16 LAB — BRAIN NATRIURETIC PEPTIDE: B Natriuretic Peptide: 1689.6 pg/mL — ABNORMAL HIGH (ref 0.0–100.0)

## 2015-02-16 MED ORDER — PHENYLEPHRINE HCL 10 MG/ML IJ SOLN
30.0000 ug/min | INTRAVENOUS | Status: DC
  Administered 2015-02-16: 125 ug/min via INTRAVENOUS
  Administered 2015-02-16: 200 ug/min via INTRAVENOUS
  Administered 2015-02-16: 60 ug/min via INTRAVENOUS
  Administered 2015-02-16: 125 ug/min via INTRAVENOUS
  Filled 2015-02-16 (×5): qty 4

## 2015-02-16 MED ORDER — MIDAZOLAM HCL 2 MG/2ML IJ SOLN
INTRAMUSCULAR | Status: AC
  Administered 2015-02-16: 2 mg
  Administered 2015-02-16: 10:00:00
  Filled 2015-02-16: qty 4

## 2015-02-16 MED ORDER — HEPARIN (PORCINE) 2000 UNITS/L FOR CRRT
INTRAVENOUS_CENTRAL | Status: DC | PRN
  Administered 2015-02-17 – 2015-02-25 (×2): via INTRAVENOUS_CENTRAL
  Filled 2015-02-16 (×3): qty 1000

## 2015-02-16 MED ORDER — MIDAZOLAM HCL 2 MG/2ML IJ SOLN
2.0000 mg | Freq: Once | INTRAMUSCULAR | Status: AC
  Administered 2015-02-16: 2 mg via INTRAVENOUS

## 2015-02-16 MED ORDER — CHLORHEXIDINE GLUCONATE 0.12 % MT SOLN
15.0000 mL | Freq: Two times a day (BID) | OROMUCOSAL | Status: DC
Start: 2015-02-16 — End: 2015-03-01
  Administered 2015-02-16 – 2015-03-01 (×26): 15 mL via OROMUCOSAL
  Filled 2015-02-16 (×25): qty 15

## 2015-02-16 MED ORDER — PRISMASOL BGK 4/2.5 32-4-2.5 MEQ/L IV SOLN
INTRAVENOUS | Status: DC
  Administered 2015-02-16 – 2015-03-01 (×74): via INTRAVENOUS_CENTRAL
  Filled 2015-02-16 (×101): qty 5000

## 2015-02-16 MED ORDER — SODIUM CHLORIDE 0.9 % IV SOLN
INTRAVENOUS | Status: DC
  Administered 2015-02-16: 10 mL/h via INTRAVENOUS
  Administered 2015-02-23: 17:00:00 via INTRAVENOUS

## 2015-02-16 MED ORDER — MIDAZOLAM HCL 2 MG/2ML IJ SOLN
INTRAMUSCULAR | Status: AC
  Filled 2015-02-16: qty 2

## 2015-02-16 MED ORDER — CALCIUM CHLORIDE 10 % IV SOLN
1.0000 g | Freq: Once | INTRAVENOUS | Status: AC
  Administered 2015-02-16: 1 g via INTRAVENOUS
  Filled 2015-02-16: qty 10

## 2015-02-16 MED ORDER — NOREPINEPHRINE BITARTRATE 1 MG/ML IV SOLN
0.0000 ug/min | INTRAVENOUS | Status: DC
  Filled 2015-02-16: qty 4

## 2015-02-16 MED ORDER — ETOMIDATE 2 MG/ML IV SOLN
INTRAVENOUS | Status: AC
  Administered 2015-02-16: 20 mg
  Filled 2015-02-16: qty 20

## 2015-02-16 MED ORDER — PANTOPRAZOLE SODIUM 40 MG PO PACK
40.0000 mg | PACK | Freq: Every day | ORAL | Status: DC
  Administered 2015-02-17 – 2015-02-28 (×12): 40 mg
  Filled 2015-02-16 (×14): qty 20

## 2015-02-16 MED ORDER — FENTANYL CITRATE 0.05 MG/ML IJ SOLN
INTRAMUSCULAR | Status: AC
  Administered 2015-02-16: 100 ug
  Filled 2015-02-16: qty 2

## 2015-02-16 MED ORDER — SODIUM CHLORIDE 0.9 % IV SOLN: INTRAVENOUS | Status: DC

## 2015-02-16 MED ORDER — CETYLPYRIDINIUM CHLORIDE 0.05 % MT LIQD
7.0000 mL | Freq: Four times a day (QID) | OROMUCOSAL | Status: DC
  Administered 2015-02-17 – 2015-03-01 (×50): 7 mL via OROMUCOSAL

## 2015-02-16 MED ORDER — PRISMASOL BGK 4/2.5 32-4-2.5 MEQ/L IV SOLN
INTRAVENOUS | Status: DC
  Administered 2015-02-16 – 2015-02-28 (×15): via INTRAVENOUS_CENTRAL
  Filled 2015-02-16 (×14): qty 5000

## 2015-02-16 MED ORDER — FENTANYL CITRATE 0.05 MG/ML IJ SOLN
INTRAMUSCULAR | Status: AC
  Filled 2015-02-16: qty 2

## 2015-02-16 MED ORDER — SODIUM CHLORIDE 0.9 % IV SOLN
10.0000 ug/h | INTRAVENOUS | Status: DC
  Administered 2015-02-16: 40 ug/h via INTRAVENOUS
  Administered 2015-02-16: 300 ug/h via INTRAVENOUS
  Administered 2015-02-16: 250 ug/h via INTRAVENOUS
  Administered 2015-02-17: 150 ug/h via INTRAVENOUS
  Administered 2015-02-18: 200 ug/h via INTRAVENOUS
  Administered 2015-02-18 – 2015-02-19 (×2): 300 ug/h via INTRAVENOUS
  Administered 2015-02-19 – 2015-02-20 (×2): 250 ug/h via INTRAVENOUS
  Administered 2015-02-20: 300 ug/h via INTRAVENOUS
  Administered 2015-02-21: 200 ug/h via INTRAVENOUS
  Administered 2015-02-21: 250 ug/h via INTRAVENOUS
  Administered 2015-02-22 (×2): 200 ug/h via INTRAVENOUS
  Administered 2015-02-23: 250 ug/h via INTRAVENOUS
  Administered 2015-02-23: 225 ug/h via INTRAVENOUS
  Administered 2015-02-23 – 2015-02-24 (×2): 250 ug/h via INTRAVENOUS
  Administered 2015-02-24: 275 ug/h via INTRAVENOUS
  Administered 2015-02-25: 300 ug/h via INTRAVENOUS
  Administered 2015-02-26: 100 ug/h via INTRAVENOUS
  Administered 2015-02-26: 50 ug/h via INTRAVENOUS
  Administered 2015-02-27: 225 ug/h via INTRAVENOUS
  Administered 2015-02-27 – 2015-02-28 (×2): 400 ug/h via INTRAVENOUS
  Administered 2015-02-28: 250 ug/h via INTRAVENOUS
  Administered 2015-02-28: 100 ug/h via INTRAVENOUS
  Administered 2015-02-28: 400 ug/h via INTRAVENOUS
  Administered 2015-02-28: 300 ug/h via INTRAVENOUS
  Filled 2015-02-16 (×31): qty 50

## 2015-02-16 MED ORDER — NOREPINEPHRINE BITARTRATE 1 MG/ML IV SOLN
0.0000 ug/min | INTRAVENOUS | Status: DC
  Administered 2015-02-16 – 2015-02-17 (×2): 20 ug/min via INTRAVENOUS
  Administered 2015-02-18: 5 ug/min via INTRAVENOUS
  Administered 2015-02-21: 8 ug/min via INTRAVENOUS
  Administered 2015-02-24: 3 ug/min via INTRAVENOUS
  Administered 2015-02-25 – 2015-02-26 (×2): 4 ug/min via INTRAVENOUS
  Administered 2015-03-01: 7 ug/min via INTRAVENOUS
  Filled 2015-02-16 (×11): qty 16

## 2015-02-16 MED ORDER — ROCURONIUM BROMIDE 50 MG/5ML IV SOLN
INTRAVENOUS | Status: AC
  Administered 2015-02-16: 10 mg
  Filled 2015-02-16: qty 2

## 2015-02-16 MED ORDER — SODIUM BICARBONATE 8.4 % IV SOLN
INTRAVENOUS | Status: AC
  Administered 2015-02-16: 50 meq
  Filled 2015-02-16: qty 100

## 2015-02-16 MED ORDER — PIPERACILLIN-TAZOBACTAM 3.375 G IVPB
3.3750 g | Freq: Four times a day (QID) | INTRAVENOUS | Status: DC
  Administered 2015-02-16 – 2015-02-17 (×4): 3.375 g via INTRAVENOUS
  Filled 2015-02-16 (×6): qty 50

## 2015-02-16 MED ORDER — CHLORHEXIDINE GLUCONATE 0.12 % MT SOLN
OROMUCOSAL | Status: AC
  Filled 2015-02-16: qty 15

## 2015-02-16 MED ORDER — PRISMASOL BGK 4/2.5 32-4-2.5 MEQ/L IV SOLN
INTRAVENOUS | Status: DC
  Administered 2015-02-16 – 2015-02-28 (×31): via INTRAVENOUS_CENTRAL
  Filled 2015-02-16 (×28): qty 5000

## 2015-02-16 MED ORDER — LIDOCAINE HCL (CARDIAC) 20 MG/ML IV SOLN
INTRAVENOUS | Status: AC
  Filled 2015-02-16: qty 5

## 2015-02-16 MED ORDER — SUCCINYLCHOLINE CHLORIDE 20 MG/ML IJ SOLN
INTRAMUSCULAR | Status: AC
  Filled 2015-02-16: qty 1

## 2015-02-16 MED ORDER — HEPARIN SODIUM (PORCINE) 1000 UNIT/ML DIALYSIS
1000.0000 [IU] | INTRAMUSCULAR | Status: DC | PRN
  Filled 2015-02-16: qty 6
  Filled 2015-02-16: qty 3
  Filled 2015-02-16: qty 4

## 2015-02-16 MED ORDER — VANCOMYCIN HCL IN DEXTROSE 1-5 GM/200ML-% IV SOLN
1000.0000 mg | INTRAVENOUS | Status: DC
  Administered 2015-02-16: 1000 mg via INTRAVENOUS
  Filled 2015-02-16 (×2): qty 200

## 2015-02-16 NOTE — Progress Notes (Signed)
eLink Physician-Brief Progress Note Patient Name: Bryce [name] DOB: 10/10/1946 MRN: [id number]   Date of Service  02/16/2015  HPI/Events of Note  Marginal BP on max dose of Phenylphrine. HR 65/min (NSR)  eICU Interventions  Transition from PE to NE for MAP goal of 65 mmHg     Intervention Category Major Interventions: Shock - evaluation and management;Sepsis - evaluation and management  Billy FischerDavid Simonds 02/16/2015, 8:25 PM

## 2015-02-16 NOTE — Progress Notes (Signed)
ANTIBIOTIC CONSULT NOTE - FOLLOW UP  Pharmacy Consult for Vancomycin and Zosyn Indication: septic shock  Allergies  Allergen Reactions  . Penicillins Other (See Comments)    "I fall out"    Patient Measurements: Height: 5\' 7"  (170.2 cm) Weight: 257 lb 11.5 oz (116.9 kg) IBW/kg (Calculated) : 66.1  Vital Signs: Temp: 97.8 F (36.6 C) (03/08 0325) Temp Source: Oral (03/08 0325) BP: 103/56 mmHg (03/08 1125) Pulse Rate: 97 (03/08 1125) Intake/Output from previous day: 03/07 0701 - 03/08 0700 In: 750 [I.V.:650; IV Piggyback:100] Out: -  Intake/Output from this shift: Total I/O In: 120 [I.V.:120] Out: -   Labs:  Recent Labs  2015/10/20 1222 02/16/15 0323  WBC 13.2* 13.4*  HGB 15.3 14.3  PLT 222 257  CREATININE 6.53* 6.81*   Estimated Creatinine Clearance: 12.9 mL/min (by C-G formula based on Cr of 6.81).  Assessment: 68yom started on vancomycin and zosyn yesterday for cellulitis and possible sepsis. Also noted to be in acute renal failure with sCr 6.5. Today the patient became increasingly confused, hypoxic, and hypotensive. He was transferred to the ICU, intubated and started on pressors. Renal was consulted and he is also to be initiated on CVVHD. Will adjust antibiotics accordingly.   3/7 Vancomycin>> 3/7 Zosyn>> 3/7 blood cx x2>>  Goal of Therapy:  Vancomycin trough level 15-20 mcg/ml  Plan:  1) Received vancomycin 2g load on 3/7, continue with 1g IV q24 2) Change zosyn to 3.375g IV q6 3) Follow CVVHD duration, level if needed, cultures, LOT  Fredrik RiggerMarkle, Carnisha Feltz Sue 02/16/2015,2:08 PM

## 2015-02-16 NOTE — Procedures (Signed)
Intubation Procedure Note Bryce [name] [id number] 08/04/1946  Procedure: Intubation Indications: Respiratory insufficiency  Procedure Details Consent: Risks of procedure as well as the alternatives and risks of each were explained to the (patient/caregiver).  Consent for procedure obtained. Time Out: Verified patient identification, verified procedure, site/side was marked, verified correct patient position, special equipment/implants available, medications/allergies/relevent history reviewed, required imaging and test results available.  Performed  MAC and 3 Medications:  Fentanyl 100 mcg Etomidate 20 mg Versed 2 mg NMB    Evaluation Hemodynamic Status: Transient hypotension treated with pressors; O2 sats: stable throughout Patient's Current Condition: stable Complications: No apparent complications Patient did tolerate procedure well. Chest X-ray ordered to verify placement.  CXR: pending. 8.0 tube placed to 24 cm lip =bbsh +ETCO2 changes  tolerated  Bryce [name] AliasFeinstein, MD, FACP Pgr: [phone] [facility]

## 2015-02-16 NOTE — Progress Notes (Signed)
Patient ID: Bryce [name], male   DOB: 06/15/1946, 69 y.o.   MRN: [id number]  [facility] Progress Note    Assessment/ Plan:   1. Acute renal failure: Appears to be hemodynamically mediated with evolution to ischemic ATN-worsening markers of sepsis noted overnight with poor/no urine output (patient refused Foley catheter but one placed this morning after he was intubated). Current labs, volume status and overall clinical picture, will start CRRT for clearance today. Renal ultrasound did not show any obstruction-no urinalysis/urine electrolytes resulted due to no urine output. 2. Sepsis: Started on empiric intravenous antibiotic /intravenous fluids and on pressors- 3. Hyponatremia: Secondary to free water excretion defect of acute renal failure, start CRRT-anticipate to improve current sodium level, will monitor for renal recovery as we do this. 4. Lower extremity wounds: Seen earlier by Dr. Carleene Cooperuda-management noted with Profore compression wraps to be changed twice a week. 5. Diarrhea/fecal incontinence: Ongoing evaluation for infectious etiology-will continue to monitor  Subjective:   Transfer to ICU this morning-intubated and on pressors    Objective:   BP 87/50 mmHg  Pulse 99  Temp(Src) 97.8 F (36.6 C) (Oral)  Resp 20  Ht 5\' 7"  (1.702 m)  Wt 116.9 kg (257 lb 11.5 oz)  BMI 40.35 kg/m2  SpO2 100%  Intake/Output Summary (Last 24 hours) at 02/16/15 0843 Last data filed at 02/16/15 0600  Gross per 24 hour  Intake    750 ml  Output      0 ml  Net    750 ml   Weight change:   Physical Exam: Gen: Intubated, sedated CVS: Pulse regular tachycardia, S1 and S2 with ESM Resp: Coarse breath sounds bilaterally-no rales Abd: Soft, obese, nontender Ext: 2-3+ lower extremity edema/lymphedema with extensive ulcerations  Imaging: [facility]  03/03/2015   CLINICAL DATA:  Acute renal failure, sepsis, hypertension and diabetes.  EXAM: RENAL/URINARY TRACT ULTRASOUND COMPLETE   COMPARISON:  None.  FINDINGS: Right Kidney:  Length: 11.2 cm. No significant atrophy or hydronephrosis. Suggestion of mildly increased cortical echogenicity.  Left Kidney:  Length: 11.4 cm. No atrophy or hydronephrosis. Similar to the right kidney, suggestion of mildly increased cortical echogenicity.  Bladder:  Appears normal for degree of bladder distention.  IMPRESSION: No evidence of renal obstruction. Suggestion of mildly increased cortical echogenicity may reflect some degree of underlying chronic kidney disease.   Electronically Signed   By: Irish LackGlenn  Yamagata M.D.   On: 03/10/2015 20:42   Dg Chest Port 1 View  (if Code Sepsis Called)  02/28/2015   CLINICAL DATA:  Fall.  Right red blood per rectum.  EXAM: PORTABLE CHEST - 1 VIEW  COMPARISON:  05/31/2014  FINDINGS: Cardiomegaly with vascular congestion diffuse interstitial prominence could reflect interstitial edema. No confluent opacities or effusions. No acute bony abnormality.  IMPRESSION: Cardiomegaly with vascular congestion and possible early interstitial edema.   Electronically Signed   By: Charlett NoseKevin  Dover M.D.   On: 03/07/2015 11:51   Dg Tibia/fibula Left Port  02/13/2015   CLINICAL DATA:  Bilateral lower extremity cellulitis. Several open wounds.  EXAM: PORTABLE LEFT TIBIA AND FIBULA - 2 VIEW  COMPARISON:  None.  FINDINGS: Mild degenerative changes with joint space narrowing in the left knee. No acute bony abnormality. Specifically, no fracture, subluxation, or dislocation. Soft tissues are intact. No radiographic changes of osteomyelitis.  IMPRESSION: No acute bony abnormality.   Electronically Signed   By: Charlett NoseKevin  Dover M.D.   On: 02/25/2015 17:29   Dg Tibia/fibula Right Port  22-Feb-2015   CLINICAL DATA:  Bilateral lower extremity cellulitis. Several opens wounds bilaterally.  EXAM: PORTABLE RIGHT TIBIA AND FIBULA - 2 VIEW  COMPARISON:  None.  FINDINGS: Mild joint space narrowing within the medial compartment of the right knee. No acute bony  abnormality. Specifically, no fracture, subluxation, or dislocation. Soft tissues are intact. No radiographic changes of osteomyelitis.  IMPRESSION: No acute bony abnormality.   Electronically Signed   By: Charlett Nose M.D.   On: 2015/02/22 17:29   Dg Femur Port Min 2 Views Left  02-22-15   CLINICAL DATA:  Bilateral lower extremity cellulitis. Several open wounds.  EXAM: LEFT FEMUR PORTABLE 2 VIEWS  COMPARISON:  None.  FINDINGS: Mild degenerative changes in the left hip and knee with joint space narrowing and spurring. No acute bony abnormality. Specifically, no fracture, subluxation, or dislocation. No radiographic changes of osteomyelitis. No radiopaque foreign body.  IMPRESSION: No acute bony abnormality.   Electronically Signed   By: Charlett Nose M.D.   On: Feb 22, 2015 17:26   Dg Femur Port, Min 2 Views Right  February 22, 2015   CLINICAL DATA:  Bilateral lower extremity cellulitis. Several open wounds.  EXAM: RIGHT FEMUR PORTABLE 1 VIEW  COMPARISON:  None.  FINDINGS: There is deformity from old healed mid femoral shaft fracture with exuberant callus formation. No acute fracture. Advanced degenerative changes within the right hip with complete joint space loss and osteophyte formation. No radiographic changes of osteomyelitis.  IMPRESSION: Deformity from old healed right femoral shaft fracture with exuberant callus.  Advanced degenerative changes in the right hip appear   Electronically Signed   By: Charlett Nose M.D.   On: 2015/02/22 17:28    Labs: BMET  Recent Labs Lab 02-22-15 1222 02/16/15 0323  NA 131* 132*  K 3.9 4.0  CL 88* 93*  CO2 21 21  GLUCOSE 168* 146*  BUN 128* 127*  CREATININE 6.53* 6.81*  CALCIUM 8.0* 7.9*   CBC  Recent Labs Lab February 22, 2015 1222 02/16/15 0323  WBC 13.2* 13.4*  NEUTROABS 11.6*  --   HGB 15.3 14.3  HCT 46.2 43.8  MCV 90.9 89.8  PLT 222 257    Medications:    . sodium chloride   Intravenous STAT  . aspirin EC  81 mg Oral q morning - 10a  . docusate  sodium  100 mg Oral BID  . etomidate      . fentaNYL      . folic acid  1 mg Oral q morning - 10a  . heparin  5,000 Units Subcutaneous 3 times per day  . insulin aspart  0-15 Units Subcutaneous TID WC  . lidocaine (cardiac) 100 mg/67ml      . midazolam      . piperacillin-tazobactam (ZOSYN)  IV  2.25 g Intravenous Q6H  . rocuronium      . senna  1 tablet Oral BID  . sodium chloride  3 mL Intravenous Q12H  . succinylcholine      . thiamine  100 mg Oral Daily   Zetta Bills, MD 02/16/2015, 8:43 AM

## 2015-02-16 NOTE — Progress Notes (Signed)
[facility] PROGRESS NOTE  Bryce MountJoseph P [name] ZOX:[id number] DOB: 11/28/1946 DOA: 02/25/2015 PCP: Dorrene GermanAVBUERE,EDWIN A, MD  Assessment/Plan: 1-Sepsis; secondary LE cellulitis.  Patient BP in the 80--50.  IV bolus order.  Continue with IV antibiotics. CCM consulted.  Need central line. Arterial Line.  Blood culture pending.   2-Acute Respiratory Failure: Hypoxic.  Patient Oxygen sat drop to 80,  Now on NB mask.  Stat ABG. High risk for intubation.  CCM consulted.   3-Acute Renal Failure. Unable to assess urine out put. Patient refuse foley catheter.  Continue with IV fluids.  Renal consulted.   4-Encephalopathy; patient lethargic.  Suspect multifactorial. Renal failure, Respiratory . Failure. Liver failure.  ABG stat.   5-Diabetes; hold metformin.   6-Diastolic HF; At risk for volume overload.   7-Infected lower extremity wound with cellulitis - We will check x-ray of bilateral lower extremities,  -Continue with  IV vancomycin and Zosyn. - Consulted orthopedic Dr. Lajoyce Cornersuda, who recommend wound care and IV antibiotics for now.   9-Transaminases, elevated bilirubin:  Check ammonia level.  RUQ US.   Code Status: Full Code.  Family Communication: None at bedside.  Disposition Plan: Transfer to ICU.    Consultants:  CCM  Renal.   Procedures:  none  Antibiotics:  Vancomycin 3-8  Zosyn 3-8  HPI/Subjective: Patient lethargic , following some command,  Patient hypotensive, BP in the 60, dessating 80.    Objective: Filed Vitals:   02/16/15 0400  BP:   Pulse: 99  Temp:   Resp: 20    Intake/Output Summary (Last 24 hours) at 02/16/15 0743 Last data filed at 02/16/15 0600  Gross per 24 hour  Intake    750 ml  Output      0 ml  Net    750 ml   Filed Weights   02/14/2015 1040 03/05/2015 1758 02/16/15 0400  Weight: 117.935 kg (260 lb) 116.3 kg (256 lb 6.3 oz) 116.9 kg (257 lb 11.5 oz)    Exam:   General:  Lethargic, follow some command  Cardiovascular:  S 1, S 2 RRR, Tachycardic  Respiratory: Decreases breath sound.   Abdomen: Obese, distended.   Musculoskeletal: bilateral edema, blister, open wound,   Data Reviewed: Basic Metabolic Panel:  Recent Labs Lab 02/20/2015 1222 02/16/15 0323  NA 131* 132*  K 3.9 4.0  CL 88* 93*  CO2 21 21  GLUCOSE 168* 146*  BUN 128* 127*  CREATININE 6.53* 6.81*  CALCIUM 8.0* 7.9*   Liver Function Tests:  Recent Labs Lab 02/23/2015 1222 02/16/15 0323  AST 57* 56*  ALT 203* 167*  ALKPHOS 112 100  BILITOT 6.1* 6.6*  PROT 7.3 8.5*  ALBUMIN 2.1* 2.0*   No results for input(s): LIPASE, AMYLASE in the last 168 hours. No results for input(s): AMMONIA in the last 168 hours. CBC:  Recent Labs Lab 03/09/2015 1222 02/16/15 0323  WBC 13.2* 13.4*  NEUTROABS 11.6*  --   HGB 15.3 14.3  HCT 46.2 43.8  MCV 90.9 89.8  PLT 222 257   Cardiac Enzymes: No results for input(s): CKTOTAL, CKMB, CKMBINDEX, TROPONINI in the last 168 hours. BNP (last 3 results)  Recent Labs  02/19/2015 1222  BNP 2107.7*    ProBNP (last 3 results)  Recent Labs  05/29/14 1206 05/31/14 1718  PROBNP 3056.0* 3281.0*    CBG:  Recent Labs Lab 02/19/2015 2137  GLUCAP 135*    Recent Results (from the past 240 hour(s))  MRSA PCR Screening     Status: Abnormal  Collection Time: 02-28-2015  6:02 PM  Result Value Ref Range Status   MRSA by PCR POSITIVE (A) NEGATIVE Final    Comment:        The GeneXpert MRSA Assay (FDA approved for NASAL specimens only), is one component of a comprehensive MRSA colonization surveillance program. It is not intended to diagnose MRSA infection nor to guide or monitor treatment for MRSA infections. RESULT CALLED TO, READ BACK BY AND VERIFIED WITH: A WEATHERFORD RN 2152 02/28/2015 A BROWNING      Studies: US Renal  2015-02-28   CLINICAL DATA:  Acute renal failure, sepsis, hypertension and diabetes.  EXAM: RENAL/URINARY TRACT ULTRASOUND COMPLETE  COMPARISON:  None.  FINDINGS: Right  Kidney:  Length: 11.2 cm. No significant atrophy or hydronephrosis. Suggestion of mildly increased cortical echogenicity.  Left Kidney:  Length: 11.4 cm. No atrophy or hydronephrosis. Similar to the right kidney, suggestion of mildly increased cortical echogenicity.  Bladder:  Appears normal for degree of bladder distention.  IMPRESSION: No evidence of renal obstruction. Suggestion of mildly increased cortical echogenicity may reflect some degree of underlying chronic kidney disease.   Electronically Signed   By: Irish Lack M.D.   On: 02-28-2015 20:42   Dg Chest Port 1 View  (if Code Sepsis Called)  2015-02-28   CLINICAL DATA:  Fall.  Right red blood per rectum.  EXAM: PORTABLE CHEST - 1 VIEW  COMPARISON:  05/31/2014  FINDINGS: Cardiomegaly with vascular congestion diffuse interstitial prominence could reflect interstitial edema. No confluent opacities or effusions. No acute bony abnormality.  IMPRESSION: Cardiomegaly with vascular congestion and possible early interstitial edema.   Electronically Signed   By: Charlett Nose M.D.   On: February 28, 2015 11:51   Dg Tibia/fibula Left Port  02-28-2015   CLINICAL DATA:  Bilateral lower extremity cellulitis. Several open wounds.  EXAM: PORTABLE LEFT TIBIA AND FIBULA - 2 VIEW  COMPARISON:  None.  FINDINGS: Mild degenerative changes with joint space narrowing in the left knee. No acute bony abnormality. Specifically, no fracture, subluxation, or dislocation. Soft tissues are intact. No radiographic changes of osteomyelitis.  IMPRESSION: No acute bony abnormality.   Electronically Signed   By: Charlett Nose M.D.   On: 2015/02/28 17:29   Dg Tibia/fibula Right Port  02/28/15   CLINICAL DATA:  Bilateral lower extremity cellulitis. Several opens wounds bilaterally.  EXAM: PORTABLE RIGHT TIBIA AND FIBULA - 2 VIEW  COMPARISON:  None.  FINDINGS: Mild joint space narrowing within the medial compartment of the right knee. No acute bony abnormality. Specifically, no fracture,  subluxation, or dislocation. Soft tissues are intact. No radiographic changes of osteomyelitis.  IMPRESSION: No acute bony abnormality.   Electronically Signed   By: Charlett Nose M.D.   On: February 28, 2015 17:29   Dg Femur Port Min 2 Views Left  02/28/15   CLINICAL DATA:  Bilateral lower extremity cellulitis. Several open wounds.  EXAM: LEFT FEMUR PORTABLE 2 VIEWS  COMPARISON:  None.  FINDINGS: Mild degenerative changes in the left hip and knee with joint space narrowing and spurring. No acute bony abnormality. Specifically, no fracture, subluxation, or dislocation. No radiographic changes of osteomyelitis. No radiopaque foreign body.  IMPRESSION: No acute bony abnormality.   Electronically Signed   By: Charlett Nose M.D.   On: 2015/02/28 17:26   Dg Femur Port, Min 2 Views Right  2015/02/28   CLINICAL DATA:  Bilateral lower extremity cellulitis. Several open wounds.  EXAM: RIGHT FEMUR PORTABLE 1 VIEW  COMPARISON:  None.  FINDINGS:  There is deformity from old healed mid femoral shaft fracture with exuberant callus formation. No acute fracture. Advanced degenerative changes within the right hip with complete joint space loss and osteophyte formation. No radiographic changes of osteomyelitis.  IMPRESSION: Deformity from old healed right femoral shaft fracture with exuberant callus.  Advanced degenerative changes in the right hip appear   Electronically Signed   By: Charlett Nose M.D.   On: 02/24/2015 17:28    Scheduled Meds: . sodium chloride   Intravenous STAT  . aspirin EC  81 mg Oral q morning - 10a  . docusate sodium  100 mg Oral BID  . folic acid  1 mg Oral q morning - 10a  . heparin  5,000 Units Subcutaneous 3 times per day  . insulin aspart  0-15 Units Subcutaneous TID WC  . piperacillin-tazobactam (ZOSYN)  IV  2.25 g Intravenous Q6H  . senna  1 tablet Oral BID  . sodium chloride  3 mL Intravenous Q12H  . thiamine  100 mg Oral Daily   Continuous Infusions:   Principal Problem:   Sepsis Active  Problems:   DM (diabetes mellitus) type II controlled peripheral vascular disorder   Hyperlipemia   DIASTOLIC HEART FAILURE, CHRONIC   Cellulitis and abscess of leg   HTN (hypertension)   Acute renal failure    Time spent: 35 minutes.     Hartley Barefoot A  [facility] Pager [phone]. If 7PM-7AM, please contact night-coverage at [email or web address], password [facility] 02/16/2015, 7:43 AM  LOS: 1 day

## 2015-02-16 NOTE — Procedures (Signed)
Central Venous Catheter Insertion Procedure Note Bryce [name] [id number] 01/30/1946  Procedure: Insertion of Central Venous Catheter Indications: Assessment of intravascular volume and cvvhd  Procedure Details Consent: Risks of procedure as well as the alternatives and risks of each were explained to the (patient/caregiver).  Consent for procedure obtained. Time Out: Verified patient identification, verified procedure, site/side was marked, verified correct patient position, special equipment/implants available, medications/allergies/relevent history reviewed, required imaging and test results available.  Performed  Maximum sterile technique was used including antiseptics, cap, gloves, gown, hand hygiene, mask and sheet. Skin prep: Chlorhexidine; local anesthetic administered A antimicrobial bonded/coated triple lumen catheter was placed in the right internal jugular vein using the Seldinger technique.  Evaluation Blood flow good Complications: No apparent complications Patient did tolerate procedure well. Chest X-ray ordered to verify placement.  CXR: pending.  Bryce BucksFEINSTEIN,Bryce [name]. 02/16/2015, 12:07 PM  US  Bryce [name] AliasFeinstein, MD, FACP Pgr: [phone] [name]
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
## 2015-02-16 NOTE — Consult Note (Signed)
WOC wound consult note Reason for Consult: place bilateral compression 4 layer per orthopedic orders.  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  Measurement: LLE: 14cm x circumferentially open x 0.1cm  RLE: 11cm x 10cm x 0.2cm   Wound bed: LLE: partial thickness skin loss circumferentially with pink, moist skin under the loosening, one dark area on the lateral calf but not eschar RLE: mostly pink, with some scattered mild fibrin over the wound.  Drainage (amount, consistency, odor) moderate to heavy drainage, worse from the right LE and odor however I have trimmed away all the loose skin which has allowed the drainage to be removed.  Periwound: 2+edema L>R; some intact bulla on the dorsal left foot and medial malleolus  Dressing procedure/placement/frequency:non adherent gauze to the open wounds, then 4 layer compression wraps applied bilaterally.   Conservative sharp wound debridement (CSWD performed at the bedside): trimmed away large sections of loose non viable dangling skin, appears to be roof of enormous bulla.   WOC will follow along with you for 2x wk compression wrap changes and wound assessments.  [name] Joynt [place] RN,CWOCN [phone]

## 2015-02-16 NOTE — Procedures (Signed)
Central Venous Catheter Insertion Procedure Note Perry MountJoseph P Banfill [id number] 05/15/1946  Procedure: Insertion of Central Venous Catheter Indications: Assessment of intravascular volume, Drug and/or fluid administration and Frequent blood sampling  Procedure Details Consent: Risks of procedure as well as the alternatives and risks of each were explained to the (patient/caregiver).  Consent for procedure obtained. Time Out: Verified patient identification, verified procedure, site/side was marked, verified correct patient position, special equipment/implants available, medications/allergies/relevent history reviewed, required imaging and test results available.  Performed  Maximum sterile technique was used including antiseptics, cap, gloves, gown, hand hygiene, mask and sheet. Skin prep: Chlorhexidine; local anesthetic administered A antimicrobial bonded/coated triple lumen catheter was placed in the left internal jugular vein using the Seldinger technique. Ultrasound guidance used.Yes.   Catheter placed to 20 cm. Blood aspirated via all 3 ports and then flushed x 3. Line sutured x 2 and dressing applied.  Evaluation Blood flow good Complications: No apparent complications Patient did tolerate procedure well. Chest X-ray ordered to verify placement.  CXR: pending. US stat  [name], MD, FACP Pgr: [phone] [facility]

## 2015-02-16 NOTE — Procedures (Signed)
Arterial Catheter Insertion Procedure Note Bryce [name] [id number] 03/05/1946  Procedure: Insertion of Arterial Catheter  Indications: Blood pressure monitoring  Procedure Details Consent: Risks of procedure as well as the alternatives and risks of each were explained to the (patient/caregiver).  Consent for procedure obtained. Time Out: Verified patient identification, verified procedure, site/side was marked, verified correct patient position, special equipment/implants available, medications/allergies/relevent history reviewed, required imaging and test results available.  Performed  Maximum sterile technique was used including antiseptics, cap, gloves, gown, hand hygiene, mask and sheet. Skin prep: Chlorhexidine; local anesthetic administered 20 gauge catheter was inserted into right radial artery using the Seldinger technique.  Evaluation Blood flow good; BP tracing good. Complications: No apparent complications.   US stat No radials   Bryce [name] AliasFeinstein, MD, FACP Pgr: [phone] [facility]
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
## 2015-02-17 ENCOUNTER — Inpatient Hospital Stay (HOSPITAL_COMMUNITY): Payer: Commercial Managed Care - HMO

## 2015-02-17 ENCOUNTER — Encounter (HOSPITAL_COMMUNITY): Payer: Self-pay | Admitting: *Deleted

## 2015-02-17 DIAGNOSIS — L03116 Cellulitis of left lower limb: Secondary | ICD-10-CM

## 2015-02-17 DIAGNOSIS — L0390 Cellulitis, unspecified: Secondary | ICD-10-CM | POA: Insufficient documentation

## 2015-02-17 LAB — CBC WITH DIFFERENTIAL/PLATELET
BASOS PCT: 0 % (ref 0–1)
Basophils Absolute: 0 10*3/uL (ref 0.0–0.1)
EOS ABS: 0 10*3/uL (ref 0.0–0.7)
EOS PCT: 0 % (ref 0–5)
HCT: 29.3 % — ABNORMAL LOW (ref 39.0–52.0)
Hemoglobin: 9.7 g/dL — ABNORMAL LOW (ref 13.0–17.0)
Lymphocytes Relative: 4 % — ABNORMAL LOW (ref 12–46)
Lymphs Abs: 0.4 10*3/uL — ABNORMAL LOW (ref 0.7–4.0)
MCH: 29.3 pg (ref 26.0–34.0)
MCHC: 33.1 g/dL (ref 30.0–36.0)
MCV: 88.5 fL (ref 78.0–100.0)
Monocytes Absolute: 0.7 10*3/uL (ref 0.1–1.0)
Monocytes Relative: 8 % (ref 3–12)
NEUTROS ABS: 8.5 10*3/uL — AB (ref 1.7–7.7)
Neutrophils Relative %: 88 % — ABNORMAL HIGH (ref 43–77)
Platelets: 163 10*3/uL (ref 150–400)
RBC: 3.31 MIL/uL — AB (ref 4.22–5.81)
RDW: 16.6 % — ABNORMAL HIGH (ref 11.5–15.5)
WBC: 9.6 10*3/uL (ref 4.0–10.5)

## 2015-02-17 LAB — GLUCOSE, CAPILLARY
GLUCOSE-CAPILLARY: 61 mg/dL — AB (ref 70–99)
GLUCOSE-CAPILLARY: 68 mg/dL — AB (ref 70–99)
Glucose-Capillary: 55 mg/dL — ABNORMAL LOW (ref 70–99)
Glucose-Capillary: 85 mg/dL (ref 70–99)
Glucose-Capillary: 108 mg/dL — ABNORMAL HIGH (ref 70–99)
Glucose-Capillary: 158 mg/dL — ABNORMAL HIGH (ref 70–99)
Glucose-Capillary: 125 mg/dL — ABNORMAL HIGH (ref 70–99)

## 2015-02-17 LAB — POCT ACTIVATED CLOTTING TIME
Activated Clotting Time: 134 seconds
Activated Clotting Time: 140 seconds
Activated Clotting Time: 159 seconds
Activated Clotting Time: 159 seconds
Activated Clotting Time: 159 seconds
Activated Clotting Time: 159 seconds
Activated Clotting Time: 165 seconds

## 2015-02-17 LAB — BASIC METABOLIC PANEL
ANION GAP: 15 (ref 5–15)
BUN: 81 mg/dL — ABNORMAL HIGH (ref 6–23)
CALCIUM: 7.1 mg/dL — AB (ref 8.4–10.5)
CO2: 17 mmol/L — ABNORMAL LOW (ref 19–32)
Chloride: 100 mmol/L (ref 96–112)
Creatinine, Ser: 4.12 mg/dL — ABNORMAL HIGH (ref 0.50–1.35)
GFR calc non Af Amer: 14 mL/min — ABNORMAL LOW (ref >90)
GFR, EST AFRICAN AMERICAN: 16 mL/min — AB (ref >90)
GLUCOSE: 50 mg/dL — AB (ref 70–99)
Potassium: 3.5 mmol/L (ref 3.5–5.1)
Sodium: 132 mmol/L — ABNORMAL LOW (ref 135–145)

## 2015-02-17 LAB — POCT I-STAT 3, ART BLOOD GAS (G3+)
Acid-base deficit: 6 mmol/L — ABNORMAL HIGH (ref 0.0–2.0)
Bicarbonate: 19.7 mEq/L — ABNORMAL LOW (ref 20.0–24.0)
O2 Saturation: 96 %
PCO2 ART: 40.5 mmHg (ref 35.0–45.0)
PH ART: 7.296 — AB (ref 7.350–7.450)
PO2 ART: 93 mmHg (ref 80.0–100.0)
TCO2: 21 mmol/L (ref 0–100)

## 2015-02-17 LAB — LACTIC ACID, PLASMA: LACTIC ACID, VENOUS: 2.2 mmol/L — AB (ref 0.5–2.0)

## 2015-02-17 LAB — PROCALCITONIN: PROCALCITONIN: 8.06 ng/mL

## 2015-02-17 LAB — TSH: TSH: 2.25 u[IU]/mL (ref 0.350–4.500)

## 2015-02-17 LAB — C4 COMPLEMENT: Complement C4, Body Fluid: 36 mg/dL (ref 14–44)

## 2015-02-17 LAB — C3 COMPLEMENT: C3 Complement: 132 mg/dL (ref 82–167)

## 2015-02-17 MED ORDER — SODIUM CHLORIDE 0.9 % IV SOLN
500.0000 mg | Freq: Three times a day (TID) | INTRAVENOUS | Status: DC
  Administered 2015-02-17 – 2015-02-24 (×22): 500 mg via INTRAVENOUS
  Filled 2015-02-17 (×24): qty 500

## 2015-02-17 MED ORDER — DEXTROSE 50 % IV SOLN
INTRAVENOUS | Status: AC
  Administered 2015-02-17: 12:00:00
  Filled 2015-02-17: qty 50

## 2015-02-17 MED ORDER — SODIUM CHLORIDE 0.9 % IV BOLUS (SEPSIS)
500.0000 mL | Freq: Once | INTRAVENOUS | Status: AC
  Administered 2015-02-17: 500 mL via INTRAVENOUS

## 2015-02-17 MED ORDER — HYDROCORTISONE NA SUCCINATE PF 100 MG IJ SOLR
50.0000 mg | Freq: Four times a day (QID) | INTRAMUSCULAR | Status: DC
  Administered 2015-02-17 – 2015-02-18 (×4): 50 mg via INTRAVENOUS
  Filled 2015-02-17 (×8): qty 1

## 2015-02-17 MED ORDER — HEPARIN BOLUS VIA INFUSION (CRRT)
1000.0000 [IU] | INTRAVENOUS | Status: DC | PRN
  Filled 2015-02-17: qty 1000

## 2015-02-17 MED ORDER — VITAL HIGH PROTEIN PO LIQD
1000.0000 mL | ORAL | Status: DC
  Filled 2015-02-17 (×2): qty 1000

## 2015-02-17 MED ORDER — VITAL HIGH PROTEIN PO LIQD
1000.0000 mL | ORAL | Status: DC
  Administered 2015-02-17 – 2015-02-22 (×7): 1000 mL
  Administered 2015-02-22: 07:00:00
  Administered 2015-02-23: 1000 mL
  Administered 2015-02-23: 07:00:00
  Administered 2015-02-24: 1000 mL
  Administered 2015-02-25: 08:00:00
  Administered 2015-02-25 – 2015-02-28 (×4): 1000 mL
  Administered 2015-02-28 – 2015-03-01 (×2)
  Filled 2015-02-17 (×18): qty 1000

## 2015-02-17 MED ORDER — INSULIN ASPART 100 UNIT/ML ~~LOC~~ SOLN
0.0000 [IU] | SUBCUTANEOUS | Status: DC
  Administered 2015-02-17 – 2015-02-18 (×4): 3 [IU] via SUBCUTANEOUS
  Administered 2015-02-18: 2 [IU] via SUBCUTANEOUS
  Administered 2015-02-18: 5 [IU] via SUBCUTANEOUS
  Administered 2015-02-18 – 2015-02-19 (×2): 3 [IU] via SUBCUTANEOUS
  Administered 2015-02-19: 2 [IU] via SUBCUTANEOUS
  Administered 2015-02-19: 3 [IU] via SUBCUTANEOUS
  Administered 2015-02-19 (×2): 2 [IU] via SUBCUTANEOUS
  Administered 2015-02-20 (×2): 3 [IU] via SUBCUTANEOUS
  Administered 2015-02-20 – 2015-02-21 (×5): 2 [IU] via SUBCUTANEOUS
  Administered 2015-02-21 (×2): 3 [IU] via SUBCUTANEOUS
  Administered 2015-02-21: 2 [IU] via SUBCUTANEOUS
  Administered 2015-02-21 – 2015-02-22 (×5): 3 [IU] via SUBCUTANEOUS
  Administered 2015-02-22: 2 [IU] via SUBCUTANEOUS
  Administered 2015-02-22: 5 [IU] via SUBCUTANEOUS
  Administered 2015-02-22 – 2015-02-24 (×10): 3 [IU] via SUBCUTANEOUS
  Administered 2015-02-24: 5 [IU] via SUBCUTANEOUS
  Administered 2015-02-24: 3 [IU] via SUBCUTANEOUS
  Administered 2015-02-25: 5 [IU] via SUBCUTANEOUS
  Administered 2015-02-25: 2 [IU] via SUBCUTANEOUS
  Administered 2015-02-25: 3 [IU] via SUBCUTANEOUS
  Administered 2015-02-25 (×2): 5 [IU] via SUBCUTANEOUS
  Administered 2015-02-25 (×2): 8 [IU] via SUBCUTANEOUS
  Administered 2015-02-26 (×3): 5 [IU] via SUBCUTANEOUS
  Administered 2015-02-26 (×2): 3 [IU] via SUBCUTANEOUS
  Administered 2015-02-26: 8 [IU] via SUBCUTANEOUS
  Administered 2015-02-27 (×3): 5 [IU] via SUBCUTANEOUS
  Administered 2015-02-27 (×2): 3 [IU] via SUBCUTANEOUS
  Administered 2015-02-28 (×2): 5 [IU] via SUBCUTANEOUS
  Administered 2015-02-28: 3 [IU] via SUBCUTANEOUS
  Administered 2015-02-28: 5 [IU] via SUBCUTANEOUS
  Administered 2015-02-28: 8 [IU] via SUBCUTANEOUS
  Administered 2015-02-28: 5 [IU] via SUBCUTANEOUS
  Administered 2015-02-28: 3 [IU] via SUBCUTANEOUS
  Administered 2015-03-01: 5 [IU] via SUBCUTANEOUS
  Administered 2015-03-01: 3 [IU] via SUBCUTANEOUS

## 2015-02-17 MED ORDER — DEXTROSE 50 % IV SOLN
25.0000 mL | Freq: Once | INTRAVENOUS | Status: AC
  Administered 2015-02-17: 25 mL via INTRAVENOUS
  Filled 2015-02-17: qty 50

## 2015-02-17 MED ORDER — DOCUSATE SODIUM 50 MG/5ML PO LIQD
100.0000 mg | Freq: Two times a day (BID) | ORAL | Status: DC
  Administered 2015-02-17 – 2015-02-18 (×3): 100 mg via ORAL
  Filled 2015-02-17 (×7): qty 10

## 2015-02-17 MED ORDER — HEPARIN SODIUM (PORCINE) 1000 UNIT/ML DIALYSIS: 1000.0000 [IU] | INTRAMUSCULAR | Status: DC | PRN

## 2015-02-17 MED ORDER — PRO-STAT SUGAR FREE PO LIQD
60.0000 mL | Freq: Three times a day (TID) | ORAL | Status: DC
  Administered 2015-02-17 – 2015-03-01 (×37): 60 mL
  Filled 2015-02-17 (×39): qty 60

## 2015-02-17 MED ORDER — DEXTROSE 50 % IV SOLN
INTRAVENOUS | Status: AC
  Administered 2015-02-17: 25 mL
  Filled 2015-02-17: qty 50

## 2015-02-17 MED ORDER — DEXTROSE 50 % IV SOLN
50.0000 mL | Freq: Once | INTRAVENOUS | Status: AC
Start: 2015-02-17 — End: 2015-02-17
  Filled 2015-02-17: qty 50

## 2015-02-17 MED ORDER — SODIUM CHLORIDE 0.9 % IJ SOLN
250.0000 [IU]/h | INTRAMUSCULAR | Status: DC
  Administered 2015-02-17: 850 [IU]/h via INTRAVENOUS_CENTRAL
  Administered 2015-02-17: 1450 [IU]/h via INTRAVENOUS_CENTRAL
  Administered 2015-02-17: 1050 [IU]/h via INTRAVENOUS_CENTRAL
  Administered 2015-02-17: 1000 [IU]/h via INTRAVENOUS_CENTRAL
  Administered 2015-02-17: 250 [IU]/h via INTRAVENOUS_CENTRAL
  Administered 2015-02-17: 1000 [IU]/h via INTRAVENOUS_CENTRAL
  Administered 2015-02-18: 2100 [IU]/h via INTRAVENOUS_CENTRAL
  Administered 2015-02-18: 1450 [IU]/h via INTRAVENOUS_CENTRAL
  Administered 2015-02-19: 1750 [IU]/h via INTRAVENOUS_CENTRAL
  Administered 2015-02-20 (×3): 1500 [IU]/h via INTRAVENOUS_CENTRAL
  Administered 2015-02-20: 1550 [IU]/h via INTRAVENOUS_CENTRAL
  Administered 2015-02-21 – 2015-02-23 (×7): 1500 [IU]/h via INTRAVENOUS_CENTRAL
  Administered 2015-02-24 (×3): 1800 [IU]/h via INTRAVENOUS_CENTRAL
  Administered 2015-02-25: 1750 [IU]/h via INTRAVENOUS_CENTRAL
  Administered 2015-02-25: 1850 [IU]/h via INTRAVENOUS_CENTRAL
  Administered 2015-02-25: 1750 [IU]/h via INTRAVENOUS_CENTRAL
  Administered 2015-02-25 (×2): 1850 [IU]/h via INTRAVENOUS_CENTRAL
  Administered 2015-02-26: 1750 [IU]/h via INTRAVENOUS_CENTRAL
  Administered 2015-02-26: 1800 [IU]/h via INTRAVENOUS_CENTRAL
  Administered 2015-02-26: 1750 [IU]/h via INTRAVENOUS_CENTRAL
  Administered 2015-02-26: 1800 [IU]/h via INTRAVENOUS_CENTRAL
  Administered 2015-02-27: 1900 [IU]/h via INTRAVENOUS_CENTRAL
  Administered 2015-02-27 (×2): 1800 [IU]/h via INTRAVENOUS_CENTRAL
  Administered 2015-02-28: 2150 [IU]/h via INTRAVENOUS_CENTRAL
  Administered 2015-02-28: 2000 [IU]/h via INTRAVENOUS_CENTRAL
  Administered 2015-02-28: 1750 [IU]/h via INTRAVENOUS_CENTRAL
  Administered 2015-02-28 – 2015-03-01 (×2): 2000 [IU]/h via INTRAVENOUS_CENTRAL
  Filled 2015-02-17 (×53): qty 2

## 2015-02-17 NOTE — Progress Notes (Signed)
Inpatient Diabetes Program Recommendations  AACE/ADA: New Consensus Statement on Inpatient Glycemic Control (2013)  Target Ranges:  Prepandial:   less than 140 mg/dL      Peak postprandial:   less than 180 mg/dL (1-2 hours)      Critically ill patients:  140 - 180 mg/dL  Results for Perry MountDONNELL, Reeder P (MRN [id number]) as of 02/17/2015 10:49  Ref. Range 02/16/2015 21:50 02/17/2015 04:06 02/17/2015 05:30 02/17/2015 07:33 02/17/2015 08:04  Glucose-Capillary Latest Range: 70-99 mg/dL 96 55 (L) 85 61 (L) 562108 (H)   Inpatient Diabetes Program Recommendations Correction (SSI): Decrease to sensitive scale  Thank you  Piedad ClimesGina Allycia Pitz BSN, RN,CDE Inpatient Diabetes Coordinator [phone] (team pager)
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
## 2015-02-17 NOTE — Progress Notes (Signed)
ANTIBIOTIC CONSULT NOTE - FOLLOW UP  Pharmacy Consult for Primaxin Indication: bacteremia  Allergies  Allergen Reactions  . Penicillins Other (See Comments)    "I fall out"    Patient Measurements: Height: 5\' 7"  (170.2 cm) Weight: 257 lb 11.5 oz (116.9 kg) IBW/kg (Calculated) : 66.1  Vital Signs: Temp: 96.5 F (35.8 C) (03/09 0800) Temp Source: Axillary (03/09 0800) Pulse Rate: 77 (03/09 0942) Intake/Output from previous day: 03/08 0701 - 03/09 0700 In: 1537.9 [I.V.:1187.9; IV Piggyback:350] Out: 3019 [Urine:210] Intake/Output from this shift: Total I/O In: 725.2 [I.V.:175.2; IV Piggyback:550] Out: 216 [Other:216]  Labs:  Recent Labs  02/16/2015 1222 02/16/15 0323 02/16/15 1100 02/16/15 1635 02/16/15 1645 02/17/15 0400 02/17/15 0948  WBC 13.2* 13.4*  --   --   --   --  9.6  HGB 15.3 14.3  --   --   --   --  9.7*  PLT 222 257  --   --   --   --  163  LABCREA  --   --   --  113.20  114.09  --   --   --   CREATININE 6.53* 6.81* 6.77*  --  6.51* 4.12*  --    Estimated Creatinine Clearance: 21.3 mL/min (by C-G formula based on Cr of 4.12).  Assessment: 68yom started on vancomycin and zosyn for bilateral LE cellulitis and septic shock now growing GNR in his blood. CT legs pending to r/o necrotizing fascitis.  Antibiotics will be switched to primaxin. He continues on CVVHD.   Vancomycin 3/7>> 3/9 Zosyn 3/7>>3/9 Primaxin 3/9>> 3/7 blood cx x2>> 1/2 GNR 3/8 urine cx>>ngtd  Goal of Therapy:  Appropriate dosing  Plan:  1) Primaxin 500mg  IV q8 2) Continue to follow renal plans, culture results/sensitivities, LOT 3) Follow up CT legs  Fredrik RiggerMarkle, Balin Vandegrift Sue 02/17/2015,11:19 AM

## 2015-02-17 NOTE — Progress Notes (Signed)
Patient ID: Bryce [name], male   DOB: 28-Feb-1946, 69 y.o.   MRN: [id number]  [facility] Progress Note    Assessment/ Plan:   1. Acute renal failure: Appears to be hemodynamically mediated with evolution to ischemic ATN- Remains anuric--continue current prescription of CRRT and start heparin for extracorporeal anticoagulation to prolong filter life. Tolerating UF on pressors. 2. Sepsis: Started on empiric intravenous antibiotic /intravenous fluids and on pressors- cultures negative so far (suspected source LE wounds) 3. Hyponatremia: Secondary to free water excretion defect of acute renal failure, start CRRT-anticipate to improve current sodium level, will monitor for renal recovery as we do this. 4. Lower extremity wounds: Seen earlier by Dr. Carleene Cooper noted with Profore compression wraps to be changed twice a week.  Subjective:   Overnight events/E-link interventions noted   Objective:   BP 96/65 mmHg  Pulse 77  Temp(Src) 96.3 F (35.7 C) (Axillary)  Resp 35  Ht  (1.702 m)  Wt 116.9 kg (257 lb 11.5 oz)  BMI 40.35 kg/m2  SpO2 91%  Intake/Output Summary (Last 24 hours) at 02/17/15 0826 Last data filed at 02/17/15 0800  Gross per 24 hour  Intake 1537.87 ml  Output   3215 ml  Net -1677.13 ml   Weight change: 0.065 kg (2.3 oz)  Physical Exam: WUJ:WJXBJYNWG/NFAOZHY CVS: Pulse RRR, normal s1 and s2 Resp:Coarse BS, no rales QMV:HQIO, obese,non-tender Ext:LEs in profore wraps  Imaging: US Renal  02/22/2015   CLINICAL DATA:  Acute renal failure, sepsis, hypertension and diabetes.  EXAM: RENAL/URINARY TRACT ULTRASOUND COMPLETE  COMPARISON:  None.  FINDINGS: Right Kidney:  Length: 11.2 cm. No significant atrophy or hydronephrosis. Suggestion of mildly increased cortical echogenicity.  Left Kidney:  Length: 11.4 cm. No atrophy or hydronephrosis. Similar to the right kidney, suggestion of mildly increased cortical echogenicity.  Bladder:  Appears normal for  degree of bladder distention.  IMPRESSION: No evidence of renal obstruction. Suggestion of mildly increased cortical echogenicity may reflect some degree of underlying chronic kidney disease.   Electronically Signed   By: Irish Lack M.D.   On: 02/22/15 20:42   US Abdomen Limited  02/16/2015   CLINICAL DATA:  Abnormal transaminases  EXAM: US ABDOMEN LIMITED - RIGHT UPPER QUADRANT  COMPARISON:  None.  FINDINGS: Gallbladder:  No gallstones or wall thickening visualized. No sonographic Eulah Pont sign noted; patient is on a ventilator.  Common bile duct:  Diameter: 6.8 mm  Liver:  No focal lesion identified.  Heterogeneous hepatic echogenicity.  IMPRESSION: 1. No cholelithiasis or sonographic evidence of acute cholecystitis. 2. Heterogeneous hepatic echogenicity as can be seen with hepatocellular disease.   Electronically Signed   By: Elige Ko   On: 02/16/2015 22:10   Dg Chest Port 1 View  02/16/2015   CLINICAL DATA:  Central line placement.  EXAM: PORTABLE CHEST - 1 VIEW  COMPARISON:  Single view of the chest 02/16/2015 at 9:43 a.m.  FINDINGS: The patient has a new right IJ approach central venous catheter with the tip projecting over the lower superior vena cava. Support tubes and lines otherwise unchanged. No pneumothorax is identified. Left basilar airspace disease persists. Cardiomegaly is again seen. The left costophrenic angle is off the margin of film.  IMPRESSION: Tip of new right IJ catheter projects over the lower superior vena cava. Negative for pneumothorax. No other change.   Electronically Signed   By: Drusilla Kanner M.D.   On: 02/16/2015 13:28   Dg Chest Port 1 View  02/16/2015  CLINICAL DATA:  69 year old male with a history of endotracheal tube placement.  EXAM: PORTABLE CHEST - 1 VIEW  COMPARISON:  02/26/2015  FINDINGS: Left for rotation of the patient accentuates the mediastinal structures.  Cardiomediastinal silhouette likely unchanged, with left for rotation.  Interval placement of  endotracheal tube, which terminates approximately 3.6 cm above the carina.  Interval placement of left IJ central venous catheter, with the tip appearing to terminate at the confluence of the superior vena cava and the left brachiocephalic vein.  Interval placement of gastric tube, terminating out of the field of view.  Right hilar opacity. Retrocardiac region not well evaluated. No large confluent airspace disease.  IMPRESSION: Interval placement of endotracheal tube, which terminates 3.6 cm above the carina.  Interval placement of left IJ central catheter, with the tip appearing to terminate at the confluence of the brachiocephalic vein and superior vena cava.  Interval placement of gastric tube, terminating out of the field of view.  Right hilar opacity, may reflect atelectasis, edema, or developing consolidation. Left base not well evaluated given the right rotation of the patient.  Signed,  Yvone Neu. Loreta Ave, DO  [facility]  [facility]   Electronically Signed   By: Gilmer Mor D.O.   On: 02/16/2015 11:03   Dg Chest Port 1 View  (if Code Sepsis Called)  02/23/2015   CLINICAL DATA:  Fall.  Right red blood per rectum.  EXAM: PORTABLE CHEST - 1 VIEW  COMPARISON:  05/31/2014  FINDINGS: Cardiomegaly with vascular congestion diffuse interstitial prominence could reflect interstitial edema. No confluent opacities or effusions. No acute bony abnormality.  IMPRESSION: Cardiomegaly with vascular congestion and possible early interstitial edema.   Electronically Signed   By: Charlett Nose M.D.   On: 03/11/2015 11:51   Dg Tibia/fibula Left Port  02/25/2015   CLINICAL DATA:  Bilateral lower extremity cellulitis. Several open wounds.  EXAM: PORTABLE LEFT TIBIA AND FIBULA - 2 VIEW  COMPARISON:  None.  FINDINGS: Mild degenerative changes with joint space narrowing in the left knee. No acute bony abnormality. Specifically, no fracture, subluxation, or dislocation. Soft tissues  are intact. No radiographic changes of osteomyelitis.  IMPRESSION: No acute bony abnormality.   Electronically Signed   By: Charlett Nose M.D.   On: 02/28/2015 17:29   Dg Tibia/fibula Right Port  02/09/2015   CLINICAL DATA:  Bilateral lower extremity cellulitis. Several opens wounds bilaterally.  EXAM: PORTABLE RIGHT TIBIA AND FIBULA - 2 VIEW  COMPARISON:  None.  FINDINGS: Mild joint space narrowing within the medial compartment of the right knee. No acute bony abnormality. Specifically, no fracture, subluxation, or dislocation. Soft tissues are intact. No radiographic changes of osteomyelitis.  IMPRESSION: No acute bony abnormality.   Electronically Signed   By: Charlett Nose M.D.   On: 03/09/2015 17:29   Dg Abd Portable 1v  02/16/2015   CLINICAL DATA:  Orogastric tube placement.  EXAM: PORTABLE ABDOMEN - 1 VIEW  COMPARISON:  07/16/2013.  FINDINGS: Orogastric tube is present doubled back upon itself in the gastric fundus. The tip is at the cardia of the stomach. Gaseous distension of loops of small bowel.  IMPRESSION: Orogastric tube with the tip at the cardia of the stomach.   Electronically Signed   By: Andreas Newport M.D.   On: 02/16/2015 14:31   Dg Femur Port Min 2 Views Left  03/06/2015   CLINICAL DATA:  Bilateral lower extremity cellulitis. Several open wounds.  EXAM: LEFT FEMUR PORTABLE  2 VIEWS  COMPARISON:  None.  FINDINGS: Mild degenerative changes in the left hip and knee with joint space narrowing and spurring. No acute bony abnormality. Specifically, no fracture, subluxation, or dislocation. No radiographic changes of osteomyelitis. No radiopaque foreign body.  IMPRESSION: No acute bony abnormality.   Electronically Signed   By: Charlett NoseKevin  Dover M.D.   On: 02/12/2015 17:26   Dg Femur Port, Min 2 Views Right  02/26/2015   CLINICAL DATA:  Bilateral lower extremity cellulitis. Several open wounds.  EXAM: RIGHT FEMUR PORTABLE 1 VIEW  COMPARISON:  None.  FINDINGS: There is deformity from old healed mid  femoral shaft fracture with exuberant callus formation. No acute fracture. Advanced degenerative changes within the right hip with complete joint space loss and osteophyte formation. No radiographic changes of osteomyelitis.  IMPRESSION: Deformity from old healed right femoral shaft fracture with exuberant callus.  Advanced degenerative changes in the right hip appear   Electronically Signed   By: Charlett NoseKevin  Dover M.D.   On: 03/04/2015 17:28    Labs: BMET  Recent Labs Lab 03/11/2015 1222 02/16/15 0323 02/16/15 1100 02/16/15 1645 02/17/15 0400  NA 131* 132* 129* 132* 132*  K 3.9 4.0 3.2* 3.3* 3.5  CL 88* 93* 93* 92* 100  CO2 21 21 18* 19 17*  GLUCOSE 168* 146* 140* 157* 50*  BUN 128* 127* 130* 126* 81*  CREATININE 6.53* 6.81* 6.77* 6.51* 4.12*  CALCIUM 8.0* 7.9* 7.9* 7.8* 7.1*  PHOS  --   --  10.0*  --   --    CBC  Recent Labs Lab 02/25/2015 1222 02/16/15 0323  WBC 13.2* 13.4*  NEUTROABS 11.6*  --   HGB 15.3 14.3  HCT 46.2 43.8  MCV 90.9 89.8  PLT 222 257   Medications:    . antiseptic oral rinse  7 mL Mouth Rinse QID  . aspirin EC  81 mg Oral q morning - 10a  . chlorhexidine  15 mL Mouth Rinse BID  . docusate sodium  100 mg Oral BID  . folic acid  1 mg Oral q morning - 10a  . heparin  5,000 Units Subcutaneous 3 times per day  . insulin aspart  0-15 Units Subcutaneous 6 times per day  . pantoprazole sodium  40 mg Per Tube Q1200  . piperacillin-tazobactam (ZOSYN)  IV  3.375 g Intravenous Q6H  . senna  1 tablet Oral BID  . sodium chloride  3 mL Intravenous Q12H  . thiamine  100 mg Oral Daily  . vancomycin  1,000 mg Intravenous Q24H   Zetta BillsJay Sheana Bir, MD 02/17/2015, 8:26 AM

## 2015-02-17 NOTE — Progress Notes (Signed)
PULMONARY / CRITICAL CARE MEDICINE   Name: Bryce [name] MRN: [id number] DOB: 03/21/1946    ADMISSION DATE:  02/14/2015 CONSULTATION DATE:  3/8  REFERRING MD : [facility]  CHIEF COMPLAINT:  AMS  INITIAL PRESENTATION: 69 y/o M w/ PMHx of HTN, HLD, DM type II, chronic dCHF, BPH, and RBBB, admitted w/ purulent LE cellulitis, and oliguric AKI (Cr >6). PCCM consulted for acute hypoxic respiratory failure and septic shock.   STUDIES:  3/8 US RUQ>>>No cholelithiasis or sonographic evidence of acute cholecystitis. 2. Heterogeneous hepatic echogenicity as can be seen with hepatocellular disease.  SIGNIFICANT EVENTS: 3/8 >> Transfer to ICU, shock, acidotic, intubation, pressors 3/8- gram neg in blood, cvvhd  SUBJECTIVE: cvvhd on going  VITAL SIGNS: Temp:  [92.3 F (33.5 C)-97.7 F (36.5 C)] 96.3 F (35.7 C) (03/09 0400) Pulse Rate:  [66-104] 77 (03/09 0800) Resp:  [0-35] 35 (03/09 0337) BP: (64-103)/(44-65) 96/65 mmHg (03/08 1709) SpO2:  [90 %-100 %] 91 % (03/09 0800) Arterial Line BP: (85-142)/(56-84) 106/64 mmHg (03/09 0800) FiO2 (%):  [50 %-100 %] 50 % (03/09 0700) Weight:  [257 lb 11.5 oz (116.9 kg)-260 lb 2.3 oz (118 kg)] 257 lb 11.5 oz (116.9 kg) (03/09 0400)   HEMODYNAMICS: CVP:  [21 mmHg-26 mmHg] 26 mmHg   VENTILATOR SETTINGS: Vent Mode:  [-] PRVC FiO2 (%):  [50 %-100 %] 50 % Set Rate:  [26 bmp-35 bmp] 35 bmp Vt Set:  [530 mL] 530 mL PEEP:  [5 cmH20] 5 cmH20 Plateau Pressure:  [20 cmH20-26 cmH20] 23 cmH20   INTAKE / OUTPUT:  Intake/Output Summary (Last 24 hours) at 02/17/15 0821 Last data filed at 02/17/15 0800  Gross per 24 hour  Intake 1537.87 ml  Output   3215 ml  Net -1677.13 ml    PHYSICAL EXAMINATION: General:  Obese AAM, sedated Neuro:  rass -2, follows commands HEENT:  No JVD Cardiovascular:  RRR distant  Lungs:  Decreased , coarse Abdomen:  obses +bs, no r/g Musculoskeletal:  intact Skin:  Lower ext cellulitis with skin sloughing,  unchanged  LABS:  CBC  Recent Labs Lab 03/02/2015 1222 02/16/15 0323  WBC 13.2* 13.4*  HGB 15.3 14.3  HCT 46.2 43.8  PLT 222 257   BMET  Recent Labs Lab 02/16/15 1100 02/16/15 1645 02/17/15 0400  NA 129* 132* 132*  K 3.2* 3.3* 3.5  CL 93* 92* 100  CO2 18* 19 17*  BUN 130* 126* 81*  CREATININE 6.77* 6.51* 4.12*  GLUCOSE 140* 157* 50*   Electrolytes  Recent Labs Lab 02/16/15 1100 02/16/15 1645 02/17/15 0400  CALCIUM 7.9* 7.8* 7.1*  PHOS 10.0*  --   --    Sepsis Markers  Recent Labs Lab 02/10/2015 1226 02/10/2015 2005 02/16/15 0900 02/17/15 0400  LATICACIDVEN 3.88* 2.6* 3.4*  --   PROCALCITON  --   --  12.09 8.06   ABG  Recent Labs Lab 02/16/15 0931 02/16/15 1052  PHART 7.169* 7.283*  PCO2ART 53.2* 36.2  PO2ART 187.0* 91.9   Liver Enzymes  Recent Labs Lab 02/27/2015 1222 02/16/15 0323 02/16/15 1100  AST 57* 56*  --   ALT 203* 167*  --   ALKPHOS 112 100  --   BILITOT 6.1* 6.6*  --   ALBUMIN 2.1* 2.0* 1.8*   Cardiac Enzymes  Recent Labs Lab 02/16/15 0900 02/16/15 1645 02/16/15 2145  TROPONINI 0.15* 0.15* 0.16*   Glucose  Recent Labs Lab 02/16/15 0812 02/16/15 1353 02/16/15 1651 02/16/15 2150 02/17/15 0406 02/17/15 0530  GLUCAP 120*  178* 156* 96 55* 85    Imaging US Abdomen Limited  02/16/2015   CLINICAL DATA:  Abnormal transaminases  EXAM: US ABDOMEN LIMITED - RIGHT UPPER QUADRANT  COMPARISON:  None.  FINDINGS: Gallbladder:  No gallstones or wall thickening visualized. No sonographic Eulah Pont sign noted; patient is on a ventilator.  Common bile duct:  Diameter: 6.8 mm  Liver:  No focal lesion identified.  Heterogeneous hepatic echogenicity.  IMPRESSION: 1. No cholelithiasis or sonographic evidence of acute cholecystitis. 2. Heterogeneous hepatic echogenicity as can be seen with hepatocellular disease.   Electronically Signed   By: Elige Ko   On: 02/16/2015 22:10   Dg Chest Port 1 View  02/16/2015   CLINICAL DATA:  Central line  placement.  EXAM: PORTABLE CHEST - 1 VIEW  COMPARISON:  Single view of the chest 02/16/2015 at 9:43 a.m.  FINDINGS: The patient has a new right IJ approach central venous catheter with the tip projecting over the lower superior vena cava. Support tubes and lines otherwise unchanged. No pneumothorax is identified. Left basilar airspace disease persists. Cardiomegaly is again seen. The left costophrenic angle is off the margin of film.  IMPRESSION: Tip of new right IJ catheter projects over the lower superior vena cava. Negative for pneumothorax. No other change.   Electronically Signed   By: Drusilla Kanner M.D.   On: 02/16/2015 13:28   Dg Chest Port 1 View  02/16/2015   CLINICAL DATA:  69 year old male with a history of endotracheal tube placement.  EXAM: PORTABLE CHEST - 1 VIEW  COMPARISON:  03/04/15  FINDINGS: Left for rotation of the patient accentuates the mediastinal structures.  Cardiomediastinal silhouette likely unchanged, with left for rotation.  Interval placement of endotracheal tube, which terminates approximately 3.6 cm above the carina.  Interval placement of left IJ central venous catheter, with the tip appearing to terminate at the confluence of the superior vena cava and the left brachiocephalic vein.  Interval placement of gastric tube, terminating out of the field of view.  Right hilar opacity. Retrocardiac region not well evaluated. No large confluent airspace disease.  IMPRESSION: Interval placement of endotracheal tube, which terminates 3.6 cm above the carina.  Interval placement of left IJ central catheter, with the tip appearing to terminate at the confluence of the brachiocephalic vein and superior vena cava.  Interval placement of gastric tube, terminating out of the field of view.  Right hilar opacity, may reflect atelectasis, edema, or developing consolidation. Left base not well evaluated given the right rotation of the patient.  Signed,  Yvone Neu. Loreta Ave, DO  [facility]  [facility]   Electronically Signed   By: Gilmer Mor D.O.   On: 02/16/2015 11:03   Dg Abd Portable 1v  02/16/2015   CLINICAL DATA:  Orogastric tube placement.  EXAM: PORTABLE ABDOMEN - 1 VIEW  COMPARISON:  07/16/2013.  FINDINGS: Orogastric tube is present doubled back upon itself in the gastric fundus. The tip is at the cardia of the stomach. Gaseous distension of loops of small bowel.  IMPRESSION: Orogastric tube with the tip at the cardia of the stomach.   Electronically Signed   By: Andreas Newport M.D.   On: 02/16/2015 14:31     ASSESSMENT / PLAN:  PULMONARY OETT 3/8 >> A: Hypoxic Respiratory Failure ?Pulmonary Edema improved Metabolic Acidosis P:   Repeat ABg on current settings, likely may be alkalotic Goal to reduce rate pcxr in am  likely can keep even No  SBT  CARDIOVASCULAR LIJ CVL 3/8 >> RIJ Temp HD Cath 3/8 >> Right Femoral A-line 3/8 >> A: Septic Shock Chronic dCHF P:  Pressors; Levophed gtt Map goal 60-65 La repeat required, source controlled?, see ID Reviewed last ECHO, PA last 63, need to interpret cvp in this setting Cortisol, TSH, repeat lactic acid pending CRRT per renal Cortisol May need even balance, will d/w renal Consider bolus and assess response Echo re assess   RENAL A:   Acute Renal Failure/ATN; likely 2/2 sepsis  Anuric on CRRT Hyponatremia AG Metabolic Acidosis 2/2 acute renal failure/lactic acidosis P:   ccvvhd Keep even Chem in am   GASTROINTESTINAL A:   Obesity GI PPx Nutrition P:   PPI OGT Start tube feeds Vital [place] reviewed Repeat LFt in am   HEMATOLOGIC A:   DVT PPx P:  Heparin [place] CBC  INFECTIOUS A:   Purulent LE Cellulitis Sepsis/septic Shock Procalcitonin 8.06 P:   BCx2  3/7>>gram neg>>> UC  3/8>> Sputum 3/8>> Abx: 3/7 vanc>>3/9 3/7 zoysn>>3/9 3/8 Imipenem>>>  La rising, pressors remain high - CT leg, r/o nec fasc, abscess (no contrast) May need  IVIG Ramp to Imipenem  ENDOCRINE A:  DM P:   SSI Await cortisol, add empiric roids   NEUROLOGIC A:   Lethargic P:   RASS goal: -2 fent drip, WUA   FAMILY  - Updates: 3/8 updated brother  - Inter-disciplinary family meet or Palliative Care meeting due by:  day 7    Lauris Chroman, MD PGY-2, Internal Medicine Pager: [phone]  STAFF NOTE: Cindi Carbon, MD FACP have personally reviewed patient's available data, including medical history, events of note, physical examination and test results as part of my evaluation. I have discussed with resident/NP and other care providers such as pharmacist, RN and RRT. In addition, I personally evaluated patient and elicited key findings of: concernc interpetation cvp with pulm htn, keep even bolus and assess response, levo to map goals, for CT leg assess abscess, nec fasc, TF start, change to imipenem, followgram neg, repeat BC in am  The patient is critically ill with multiple organ systems failure and requires high complexity decision making for assessment and support, frequent evaluation and titration of therapies, application of advanced monitoring technologies and extensive interpretation of multiple databases.   Critical Care Time devoted to patient care services described in this note is 30 Minutes. This time reflects time of care of this signee: Rory Percy, MD FACP. This critical care time does not reflect procedure time, or teaching time or supervisory time of PA/NP/Med student/Med Resident etc but could involve care discussion time. Rest per NP/medical resident whose note is outlined above and that I agree with   Mcarthur Rossetti. Tyson Alias, MD, FACP Pgr: [phone] [facility] 02/17/2015 9:42 AM

## 2015-02-18 ENCOUNTER — Inpatient Hospital Stay (HOSPITAL_COMMUNITY): Payer: Commercial Managed Care - HMO

## 2015-02-18 DIAGNOSIS — I509 Heart failure, unspecified: Secondary | ICD-10-CM

## 2015-02-18 LAB — RENAL FUNCTION PANEL
ALBUMIN: 1.5 g/dL — AB (ref 3.5–5.2)
Albumin: 1.6 g/dL — ABNORMAL LOW (ref 3.5–5.2)
Anion gap: 12 (ref 5–15)
Anion gap: 10 (ref 5–15)
BUN: 57 mg/dL — ABNORMAL HIGH (ref 6–23)
BUN: 48 mg/dL — ABNORMAL HIGH (ref 6–23)
CALCIUM: 7.8 mg/dL — AB (ref 8.4–10.5)
CALCIUM: 7.9 mg/dL — AB (ref 8.4–10.5)
CHLORIDE: 104 mmol/L (ref 96–112)
CO2: 21 mmol/L (ref 19–32)
CO2: 21 mmol/L (ref 19–32)
Chloride: 102 mmol/L (ref 96–112)
Creatinine, Ser: 3.26 mg/dL — ABNORMAL HIGH (ref 0.50–1.35)
Creatinine, Ser: 2.62 mg/dL — ABNORMAL HIGH (ref 0.50–1.35)
GFR calc Af Amer: 21 mL/min — ABNORMAL LOW (ref >90)
GFR calc non Af Amer: 18 mL/min — ABNORMAL LOW (ref >90)
GFR calc non Af Amer: 24 mL/min — ABNORMAL LOW (ref >90)
GFR, EST AFRICAN AMERICAN: 27 mL/min — AB (ref >90)
GLUCOSE: 204 mg/dL — AB (ref 70–99)
Glucose, Bld: 181 mg/dL — ABNORMAL HIGH (ref 70–99)
PHOSPHORUS: 2.9 mg/dL (ref 2.3–4.6)
POTASSIUM: 3.7 mmol/L (ref 3.5–5.1)
Phosphorus: 4.9 mg/dL — ABNORMAL HIGH (ref 2.3–4.6)
Potassium: 3.8 mmol/L (ref 3.5–5.1)
SODIUM: 135 mmol/L (ref 135–145)
Sodium: 135 mmol/L (ref 135–145)

## 2015-02-18 LAB — POCT ACTIVATED CLOTTING TIME
ACTIVATED CLOTTING TIME: 147 s
ACTIVATED CLOTTING TIME: 159 s
ACTIVATED CLOTTING TIME: 165 s
ACTIVATED CLOTTING TIME: 171 s
ACTIVATED CLOTTING TIME: 177 s
ACTIVATED CLOTTING TIME: 177 s
ACTIVATED CLOTTING TIME: 190 s
ACTIVATED CLOTTING TIME: 196 s
Activated Clotting Time: 190 seconds
Activated Clotting Time: 196 seconds
Activated Clotting Time: 196 seconds
Activated Clotting Time: 165 seconds
Activated Clotting Time: 165 seconds
Activated Clotting Time: 171 seconds
Activated Clotting Time: 171 seconds

## 2015-02-18 LAB — GLUCOSE, CAPILLARY
GLUCOSE-CAPILLARY: 161 mg/dL — AB (ref 70–99)
Glucose-Capillary: 165 mg/dL — ABNORMAL HIGH (ref 70–99)
Glucose-Capillary: 176 mg/dL — ABNORMAL HIGH (ref 70–99)
Glucose-Capillary: 179 mg/dL — ABNORMAL HIGH (ref 70–99)
Glucose-Capillary: 204 mg/dL — ABNORMAL HIGH (ref 70–99)

## 2015-02-18 LAB — POCT I-STAT 3, ART BLOOD GAS (G3+)
Acid-base deficit: 6 mmol/L — ABNORMAL HIGH (ref 0.0–2.0)
BICARBONATE: 19.6 meq/L — AB (ref 20.0–24.0)
O2 Saturation: 96 %
PH ART: 7.31 — AB (ref 7.350–7.450)
Patient temperature: 98.5
TCO2: 21 mmol/L (ref 0–100)
pCO2 arterial: 38.9 mmHg (ref 35.0–45.0)
pO2, Arterial: 88 mmHg (ref 80.0–100.0)

## 2015-02-18 LAB — CULTURE, BLOOD (ROUTINE X 2)

## 2015-02-18 LAB — URINE CULTURE
Colony Count: NO GROWTH
Culture: NO GROWTH
SPECIAL REQUESTS: NORMAL

## 2015-02-18 LAB — CORTISOL: Cortisol, Plasma: 87.9 ug/dL

## 2015-02-18 LAB — PROCALCITONIN: PROCALCITONIN: 8.46 ng/mL

## 2015-02-18 LAB — APTT: aPTT: 53 seconds — ABNORMAL HIGH (ref 24–37)

## 2015-02-18 MED ORDER — PERFLUTREN LIPID MICROSPHERE
INTRAVENOUS | Status: AC
  Filled 2015-02-18: qty 10

## 2015-02-18 MED ORDER — SODIUM CHLORIDE 0.9 % IV SOLN
0.0300 [IU]/min | INTRAVENOUS | Status: DC
  Filled 2015-02-18: qty 2

## 2015-02-18 NOTE — Progress Notes (Signed)
PULMONARY / CRITICAL CARE MEDICINE   Name: Bryce [name] P Guinyard MRN: [id number] DOB: 12/02/1946    ADMISSION DATE:  02/09/2015 CONSULTATION DATE:  3/8  REFERRING MD : Triad  CHIEF COMPLAINT:  AMS  INITIAL PRESENTATION: 69 y/o M w/ PMHx of HTN, HLD, DM type II, chronic dCHF, BPH, and RBBB, admitted w/ purulent LE cellulitis, and oliguric AKI (Cr >6). PCCM consulted for acute hypoxic respiratory failure and septic shock.   STUDIES:  3/8 US RUQ>>>No cholelithiasis or sonographic evidence of acute cholecystitis. 2. Heterogeneous hepatic echogenicity as can be seen with hepatocellular disease. 3/9 CT lower ext>>>  SIGNIFICANT EVENTS: 3/8 >> Transfer to ICU, shock, acidotic, intubation, pressors 3/8- gram neg in blood, cvvhd 3/9 - CT done legs, continued high pressors  SUBJECTIVE: remains with levo at 20  VITAL SIGNS: Temp:  [96.5 F (35.8 C)-99.5 F (37.5 C)] 98.5 F (36.9 C) (03/10 0400) Pulse Rate:  [72-99] 81 (03/10 0700) Resp:  [35] 35 (03/10 0415) BP: (96-114)/(64-74) 114/73 mmHg (03/10 0415) SpO2:  [88 %-100 %] 97 % (03/10 0700) Arterial Line BP: (84-121)/(55-77) 108/69 mmHg (03/10 0700) FiO2 (%):  [40 %-50 %] 40 % (03/10 0500) Weight:  [116.2 kg (256 lb 2.8 oz)] 116.2 kg (256 lb 2.8 oz) (03/10 0403)   HEMODYNAMICS: CVP:  [12 mmHg-30 mmHg] 14 mmHg   VENTILATOR SETTINGS: Vent Mode:  [-] PRVC FiO2 (%):  [40 %-50 %] 40 % Set Rate:  [35 bmp] 35 bmp Vt Set:  [530 mL] 530 mL PEEP:  [5 cmH20] 5 cmH20 Plateau Pressure:  [20 cmH20-28 cmH20] 24 cmH20   INTAKE / OUTPUT:  Intake/Output Summary (Last 24 hours) at 02/18/15 0723 Last data filed at 02/18/15 0700  Gross per 24 hour  Intake 2671.2 ml  Output   2762 ml  Net  -90.8 ml    PHYSICAL EXAMINATION: General:  Obese AAM, sedated Neuro:  rass -2, not following commands, opens eyes to voice HEENT:  increasedJVD Cardiovascular:  RRR distant  Lungs:  More coarse Abdomen:  obses +bs, no r/g Musculoskeletal:   intact Skin:  Lower ext cellulitis, wrapped  LABS:  CBC  Recent Labs Lab 02/23/2015 1222 02/16/15 0323 02/17/15 0948  WBC 13.2* 13.4* 9.6  HGB 15.3 14.3 9.7*  HCT 46.2 43.8 29.3*  PLT 222 257 163   BMET  Recent Labs Lab 02/16/15 1645 02/17/15 0400 02/18/15 0024  NA 132* 132* 135  K 3.3* 3.5 3.8  CL 92* 100 102  CO2 19 17* 21  BUN 126* 81* 57*  CREATININE 6.51* 4.12* 3.26*  GLUCOSE 157* 50* 204*   Electrolytes  Recent Labs Lab 02/16/15 1100 02/16/15 1645 02/17/15 0400 02/18/15 0024  CALCIUM 7.9* 7.8* 7.1* 7.8*  PHOS 10.0*  --   --  4.9*   Sepsis Markers  Recent Labs Lab 02/27/2015 2005 02/16/15 0900 02/17/15 0400 02/17/15 1000 02/18/15 0430  LATICACIDVEN 2.6* 3.4*  --  2.2*  --   PROCALCITON  --  12.09 8.06  --  8.46   ABG  Recent Labs Lab 02/16/15 0931 02/16/15 1052 02/17/15 1035  PHART 7.169* 7.283* 7.296*  PCO2ART 53.2* 36.2 40.5  PO2ART 187.0* 91.9 93.0   Liver Enzymes  Recent Labs Lab 03/06/2015 1222 02/16/15 0323 02/16/15 1100 02/18/15 0024  AST 57* 56*  --   --   ALT 203* 167*  --   --   ALKPHOS 112 100  --   --   BILITOT 6.1* 6.6*  --   --   ALBUMIN  2.1* 2.0* 1.8* 1.6*   Cardiac Enzymes  Recent Labs Lab 02/16/15 0900 02/16/15 1645 02/16/15 2145  TROPONINI 0.15* 0.15* 0.16*   Glucose  Recent Labs Lab 02/17/15 1136 02/17/15 1204 02/17/15 1720 02/17/15 1952 02/18/15 0002 02/18/15 0405  GLUCAP 68* 158* 125* 165* 161* 176*    Imaging Dg Chest Port 1 View  02/17/2015   CLINICAL DATA:  Respiratory failure  EXAM: PORTABLE CHEST - 1 VIEW  COMPARISON:  the previous day's study  FINDINGS: Endotracheal tube, nasogastric tube, left IJ central venous catheter, and right IJ dialysis catheter are stable in position. Left retrocardiac consolidation/ atelectasis persists. Patchy right infrahilar infiltrate or atelectasis. No definite effusion. No pneumothorax. Visualized skeletal structures are unremarkable.  IMPRESSION: 1. Stable  bibasilar atelectasis/infiltrate, left greater than right. 2.  Support hardware stable in position.   Electronically Signed   By: Corlis Leak M.D.   On: 02/17/2015 09:41     ASSESSMENT / PLAN:  PULMONARY OETT 3/8 >> A: Hypoxic Respiratory Failure ?Pulmonary Edema improved Metabolic Acidosis P:   Obtain abg now on current settings Last abg was acidotic, unable to SBT If able to lower MV, can SBT Even balance, may need to have neg balance, see cvs  CARDIOVASCULAR LIJ CVL 3/8 >> RIJ Temp HD Cath 3/8 >> Right Femoral A-line 3/8 >> A: Septic Shock Chronic dCHF Prior pulm htn P:  Pressors; Levophed gtt, still required at 20 mic Add vaso Map goal 60-65 CRRT per renal Cortisol noted, no need steroids Echo re assess - awaited  RENAL A:   Acute Renal Failure/ATN; likely 2/2 sepsis  Anuric on CRRT Hyponatremia AG Metabolic Acidosis 2/2 acute renal failure/lactic acidosis P:   ccvvhd Keep even Chem in am Await pcxr for edema?, didn't respond to further bolus, cvp up to some degree from pa htn noted on last echo  GASTROINTESTINAL A:   Obesity GI PPx Nutrition P:   PPI TF to goal  HEMATOLOGIC A:   DVT PPx P:  Heparin [place] CBC  INFECTIOUS A:   Purulent LE Cellulitis Sepsis/septic Shock Gram neg septic shock P:   BCx2  3/7>>gram neg>>> UC  3/8>> Sputum 3/8>> Abx: 3/7 vanc>>3/9 3/7 zoysn>>3/9 3/8 Imipenem>>>  CT legs done, await read D/w Ortho Follow ID and sens of gram neg organism  ENDOCRINE A:  DM P:   SSI No evidence rel AI  NEUROLOGIC A:   Lethargic P:   RASS goal: -2 fent drip, WUA To chair position if able   FAMILY  - Updates: 3/8 updated brother  - Inter-disciplinary family meet or Palliative Care meeting due by:  day 7  Ccm time 30 min   Mcarthur Rossetti. Tyson Alias, MD, FACP Pgr: [phone] [facility]

## 2015-02-18 NOTE — Progress Notes (Signed)
  Echocardiogram 2D Echocardiogram has been performed.  Janalyn HarderWest, Bryce [name] R 02/18/2015, 11:25 AM

## 2015-02-18 NOTE — Procedures (Signed)
BP 114/72 mmHg  Pulse 86  Temp(Src) 98.3 F (36.8 C) (Axillary)  Resp 35  Ht 5\' 7"  (1.702 m)  Wt 116.2 kg (256 lb 2.8 oz)  BMI 40.11 kg/m2  SpO2 100% Events from overnight noted.  CRRT prescription reviewed and changes made as below. 1. Acute renal failure: Appears to be hemodynamically mediated with evolution to ischemic ATN- Remains anuric--continue current prescription of CRRT with heparin for extracorporeal anticoagulation. Start ultrafiltration at net -50 mL/h now with ability to wean down on pressors. 2. Sepsis: Started on empiric intravenous antibiotic and on pressors- cultures negative so far (suspected source LE wounds) 3. Hyponatremia: Secondary to free water excretion defect of acute renal failure, improving on CRRT. 4. Lower extremity wounds: Seen earlier by Dr. Carleene Cooperuda-management noted with Profore compression wraps to be changed twice a week.  Zetta BillsJay Rynell Ciotti MD [facility]. Office # [phone] Pager # [phone] 8:39 AM

## 2015-02-19 ENCOUNTER — Inpatient Hospital Stay (HOSPITAL_COMMUNITY): Payer: Commercial Managed Care - HMO

## 2015-02-19 ENCOUNTER — Encounter (HOSPITAL_BASED_OUTPATIENT_CLINIC_OR_DEPARTMENT_OTHER): Payer: Medicare Other | Attending: Internal Medicine

## 2015-02-19 DIAGNOSIS — R6521 Severe sepsis with septic shock: Secondary | ICD-10-CM

## 2015-02-19 LAB — COMPREHENSIVE METABOLIC PANEL
ALBUMIN: 1.6 g/dL — AB (ref 3.5–5.2)
ALK PHOS: 105 U/L (ref 39–117)
ALT: 257 U/L — AB (ref 0–53)
ANION GAP: 10 (ref 5–15)
AST: 173 U/L — ABNORMAL HIGH (ref 0–37)
BILIRUBIN TOTAL: 8.6 mg/dL — AB (ref 0.3–1.2)
BUN: 42 mg/dL — AB (ref 6–23)
CHLORIDE: 102 mmol/L (ref 96–112)
CO2: 23 mmol/L (ref 19–32)
Calcium: 8.4 mg/dL (ref 8.4–10.5)
Creatinine, Ser: 2.23 mg/dL — ABNORMAL HIGH (ref 0.50–1.35)
GFR, EST AFRICAN AMERICAN: 33 mL/min — AB (ref >90)
GFR, EST NON AFRICAN AMERICAN: 29 mL/min — AB (ref >90)
Glucose, Bld: 171 mg/dL — ABNORMAL HIGH (ref 70–99)
POTASSIUM: 3.8 mmol/L (ref 3.5–5.1)
SODIUM: 135 mmol/L (ref 135–145)
TOTAL PROTEIN: 6.8 g/dL (ref 6.0–8.3)

## 2015-02-19 LAB — CBC WITH DIFFERENTIAL/PLATELET
BASOS ABS: 0 10*3/uL (ref 0.0–0.1)
Basophils Relative: 0 % (ref 0–1)
EOS PCT: 0 % (ref 0–5)
Eosinophils Absolute: 0 10*3/uL (ref 0.0–0.7)
HCT: 44.6 % (ref 39.0–52.0)
Hemoglobin: 15 g/dL (ref 13.0–17.0)
Lymphocytes Relative: 6 % — ABNORMAL LOW (ref 12–46)
Lymphs Abs: 0.9 10*3/uL (ref 0.7–4.0)
MCH: 29.5 pg (ref 26.0–34.0)
MCHC: 33.6 g/dL (ref 30.0–36.0)
MCV: 87.8 fL (ref 78.0–100.0)
Monocytes Absolute: 1.2 10*3/uL — ABNORMAL HIGH (ref 0.1–1.0)
Monocytes Relative: 8 % (ref 3–12)
NEUTROS PCT: 86 % — AB (ref 43–77)
Neutro Abs: 13.3 10*3/uL — ABNORMAL HIGH (ref 1.7–7.7)
PLATELETS: 213 10*3/uL (ref 150–400)
RBC: 5.08 MIL/uL (ref 4.22–5.81)
RDW: 17.2 % — ABNORMAL HIGH (ref 11.5–15.5)
WBC: 15.4 10*3/uL — ABNORMAL HIGH (ref 4.0–10.5)

## 2015-02-19 LAB — GLUCOSE, CAPILLARY
GLUCOSE-CAPILLARY: 135 mg/dL — AB (ref 70–99)
Glucose-Capillary: 119 mg/dL — ABNORMAL HIGH (ref 70–99)
Glucose-Capillary: 124 mg/dL — ABNORMAL HIGH (ref 70–99)
Glucose-Capillary: 138 mg/dL — ABNORMAL HIGH (ref 70–99)
Glucose-Capillary: 150 mg/dL — ABNORMAL HIGH (ref 70–99)
Glucose-Capillary: 162 mg/dL — ABNORMAL HIGH (ref 70–99)
Glucose-Capillary: 176 mg/dL — ABNORMAL HIGH (ref 70–99)

## 2015-02-19 LAB — POCT I-STAT 3, ART BLOOD GAS (G3+)
Acid-base deficit: 5 mmol/L — ABNORMAL HIGH (ref 0.0–2.0)
BICARBONATE: 23.2 meq/L (ref 20.0–24.0)
O2 Saturation: 93 %
PCO2 ART: 49.6 mmHg — AB (ref 35.0–45.0)
PH ART: 7.275 — AB (ref 7.350–7.450)
Patient temperature: 97.1
TCO2: 25 mmol/L (ref 0–100)
pO2, Arterial: 74 mmHg — ABNORMAL LOW (ref 80.0–100.0)

## 2015-02-19 LAB — BLOOD GAS, ARTERIAL
Acid-base deficit: 3.1 mmol/L — ABNORMAL HIGH (ref 0.0–2.0)
Bicarbonate: 21.9 mEq/L (ref 20.0–24.0)
Drawn by: 24487
FIO2: 0.4 %
LHR: 35 {breaths}/min
O2 Saturation: 97.5 %
PATIENT TEMPERATURE: 98
PEEP/CPAP: 5 cmH2O
TCO2: 23.2 mmol/L (ref 0–100)
VT: 530 mL
pCO2 arterial: 42.1 mmHg (ref 35.0–45.0)
pH, Arterial: 7.333 — ABNORMAL LOW (ref 7.350–7.450)
pO2, Arterial: 113 mmHg — ABNORMAL HIGH (ref 80.0–100.0)

## 2015-02-19 LAB — CLOSTRIDIUM DIFFICILE BY PCR: CDIFFPCR: NEGATIVE

## 2015-02-19 LAB — POCT ACTIVATED CLOTTING TIME
ACTIVATED CLOTTING TIME: 208 s
ACTIVATED CLOTTING TIME: 214 s
ACTIVATED CLOTTING TIME: 220 s
ACTIVATED CLOTTING TIME: 227 s
ACTIVATED CLOTTING TIME: 220 s
ACTIVATED CLOTTING TIME: 239 s
ACTIVATED CLOTTING TIME: 233 s
ACTIVATED CLOTTING TIME: 227 s
Activated Clotting Time: 153 seconds
Activated Clotting Time: 183 seconds
Activated Clotting Time: 196 seconds
Activated Clotting Time: 208 seconds
Activated Clotting Time: 208 seconds
Activated Clotting Time: 214 seconds
Activated Clotting Time: 214 seconds
Activated Clotting Time: 214 seconds
Activated Clotting Time: 214 seconds
Activated Clotting Time: 220 seconds
Activated Clotting Time: 220 seconds
Activated Clotting Time: 220 seconds
Activated Clotting Time: 227 seconds
Activated Clotting Time: 227 seconds
Activated Clotting Time: 233 seconds
Activated Clotting Time: 233 seconds
Activated Clotting Time: 233 seconds

## 2015-02-19 LAB — RENAL FUNCTION PANEL
ALBUMIN: 1.6 g/dL — AB (ref 3.5–5.2)
ANION GAP: 8 (ref 5–15)
BUN: 39 mg/dL — ABNORMAL HIGH (ref 6–23)
CO2: 26 mmol/L (ref 19–32)
Calcium: 8.3 mg/dL — ABNORMAL LOW (ref 8.4–10.5)
Chloride: 103 mmol/L (ref 96–112)
Creatinine, Ser: 1.88 mg/dL — ABNORMAL HIGH (ref 0.50–1.35)
GFR calc non Af Amer: 35 mL/min — ABNORMAL LOW (ref >90)
GFR, EST AFRICAN AMERICAN: 41 mL/min — AB (ref >90)
Glucose, Bld: 150 mg/dL — ABNORMAL HIGH (ref 70–99)
POTASSIUM: 3.9 mmol/L (ref 3.5–5.1)
Phosphorus: 3.1 mg/dL (ref 2.3–4.6)
Sodium: 137 mmol/L (ref 135–145)

## 2015-02-19 LAB — APTT: APTT: 96 s — AB (ref 24–37)

## 2015-02-19 MED ORDER — MIDAZOLAM HCL 2 MG/2ML IJ SOLN
1.0000 mg | INTRAMUSCULAR | Status: AC | PRN
  Administered 2015-02-19 – 2015-02-20 (×3): 1 mg via INTRAVENOUS
  Filled 2015-02-19 (×2): qty 2

## 2015-02-19 MED ORDER — MIDAZOLAM HCL 2 MG/2ML IJ SOLN
1.0000 mg | INTRAMUSCULAR | Status: DC | PRN
  Administered 2015-02-22 – 2015-02-25 (×6): 1 mg via INTRAVENOUS
  Filled 2015-02-19 (×9): qty 2

## 2015-02-19 NOTE — Consult Note (Signed)
WOC wound follow up Wound type: venous stasis ulcers bilateral.  L >R   Wound ZOX:WRUEbed:Left is  15cm x circumferential partial thickness skin loss was large ruptured bulla, I removed the bulla roof that was dangling from the leg at the time of admission and further CSWD today to remove remainder of the loose non viable skin  Right lateral: 11cm x 10cm x 0.2cm, mostly clean, less fibrin today but increased drainage with compression. Edema much improved.  Drainage (amount, consistency, odor) heavy, thick sanguinous drainage from the bilateral LEs, odor noted on the wounds on the right. Periwound: intact  Dressing procedure/placement/frequency: Add silver hydrofiber for topical therapy on the right and left wounds due to the amount of exudate and for the antimicrobial coverage.  Continue 4 layer compression therapy 2x wk. As ordered by orthopedics.  Conservative sharp wound debridement (CSWD performed at the bedside): removed non viable tissue from the right LE that is hanging loose from the wound bed.   WOC team will follow along with you for weekly wound assessments and 2x wk Profore compression wrap changes. Tues/Fridays.   Please notify me of any acute changes in the wounds or any new areas of concerns Armen PickupMelody Shataria Crist RN,CWOCN [phone]

## 2015-02-19 NOTE — Progress Notes (Signed)
PULMONARY / CRITICAL CARE MEDICINE   Name: Bryce [name] P Champney MRN: [id number] DOB: 11/26/1946    ADMISSION DATE:  03/05/2015 CONSULTATION DATE:  3/8  REFERRING MD : Triad  CHIEF COMPLAINT:  AMS  INITIAL PRESENTATION: 69 y/o M w/ PMHx of HTN, HLD, DM type II, chronic dCHF, BPH, and RBBB, admitted w/ purulent LE cellulitis, and oliguric AKI (Cr >6). PCCM consulted for acute hypoxic respiratory failure and septic shock.   STUDIES:  3/8 US RUQ>>>No cholelithiasis or sonographic evidence of acute cholecystitis. 2. Heterogeneous hepatic echogenicity as can be seen with hepatocellular disease. 3/9 CT lower ext>>>soft tissue edema , no drainable fluid collection.  SIGNIFICANT EVENTS: 3/8 >> Transfer to ICU, shock, acidotic, intubation, pressors 3/8- gram neg in blood, cvvhd 3/9 - CT done legs, continued high pressors  SUBJECTIVE: afebrile On l ower levo gtt  VITAL SIGNS: Temp:  [97.1 F (36.2 C)-98.9 F (37.2 C)] 97.1 F (36.2 C) (03/11 0400) Pulse Rate:  [75-96] 80 (03/11 0800) Resp:  [35] 35 (03/11 0428) BP: (85-106)/(56-71) 104/68 mmHg (03/11 0428) SpO2:  [96 %-100 %] 100 % (03/11 0800) Arterial Line BP: (82-125)/(53-113) 117/74 mmHg (03/11 0800) FiO2 (%):  [40 %] 40 % (03/11 0700) Weight:  [115.4 kg (254 lb 6.6 oz)] 115.4 kg (254 lb 6.6 oz) (03/11 0356)   HEMODYNAMICS: CVP:  [14 mmHg-20 mmHg] 14 mmHg   VENTILATOR SETTINGS: Vent Mode:  [-] PRVC FiO2 (%):  [40 %] 40 % Set Rate:  [35 bmp] 35 bmp Vt Set:  [530 mL] 530 mL PEEP:  [5 cmH20] 5 cmH20 Plateau Pressure:  [22 cmH20-24 cmH20] 24 cmH20   INTAKE / OUTPUT:  Intake/Output Summary (Last 24 hours) at 02/19/15 0907 Last data filed at 02/19/15 0800  Gross per 24 hour  Intake 2423.5 ml  Output   3607 ml  Net -1183.5 ml    PHYSICAL EXAMINATION: General:  Obese AAM, sedated Neuro:  rass -2, not following commands, opens eyes to voice HEENT:  increasedJVD Cardiovascular:  RRR distant  Lungs:  More coarse, RR 35 with  autoPEEP Abdomen:  obses +bs, no r/g Musculoskeletal:  intact Skin:  Lower ext cellulitis, wrapped  LABS:  CBC  Recent Labs Lab 02/16/15 0323 02/17/15 0948 02/19/15 0400  WBC 13.4* 9.6 15.4*  HGB 14.3 9.7* 15.0  HCT 43.8 29.3* 44.6  PLT 257 163 213   BMET  Recent Labs Lab 02/18/15 0024 02/18/15 1530 02/19/15 0400  NA 135 135 135  K 3.8 3.7 3.8  CL 102 104 102  CO2 21 21 23   BUN 57* 48* 42*  CREATININE 3.26* 2.62* 2.23*  GLUCOSE 204* 181* 171*   Electrolytes  Recent Labs Lab 02/16/15 1100  02/18/15 0024 02/18/15 1530 02/19/15 0400  CALCIUM 7.9*  < > 7.8* 7.9* 8.4  PHOS 10.0*  --  4.9* 2.9  --   < > = values in this interval not displayed. Sepsis Markers  Recent Labs Lab 11/18/2015 2005 02/16/15 0900 02/17/15 0400 02/17/15 1000 02/18/15 0430  LATICACIDVEN 2.6* 3.4*  --  2.2*  --   PROCALCITON  --  12.09 8.06  --  8.46   ABG  Recent Labs Lab 02/17/15 1035 02/18/15 0738 02/19/15 0335  PHART 7.296* 7.310* 7.333*  PCO2ART 40.5 38.9 42.1  PO2ART 93.0 88.0 113.0*   Liver Enzymes  Recent Labs Lab 11/18/2015 1222 02/16/15 0323  02/18/15 0024 02/18/15 1530 02/19/15 0400  AST 57* 56*  --   --   --  173*  ALT 203*  167*  --   --   --  257*  ALKPHOS 112 100  --   --   --  105  BILITOT 6.1* 6.6*  --   --   --  8.6*  ALBUMIN 2.1* 2.0*  < > 1.6* 1.5* 1.6*  < > = values in this interval not displayed. Cardiac Enzymes  Recent Labs Lab 02/16/15 0900 02/16/15 1645 02/16/15 2145  TROPONINI 0.15* 0.15* 0.16*   Glucose  Recent Labs Lab 02/18/15 0405 02/18/15 0744 02/18/15 1212 02/18/15 1729 02/18/15 1910 02/18/15 2347  GLUCAP 176* 179* 204* 138* 176* 162*    Imaging Dg Chest Port 1 View  02/18/2015   CLINICAL DATA:  Sepsis.  EXAM: PORTABLE CHEST - 1 VIEW  COMPARISON:  02/17/2015.  FINDINGS: Endotracheal tube, left patchy IJ line, right dialysis catheter, NG tube in good anatomic position. Stable cardiomegaly. Mild pulmonary vascular  congestion. Bibasilar atelectasis and/or infiltrates. Small left pleural effusion cannot be excluded. No pneumothorax.  IMPRESSION: 1. Lines and tubes in stable position. 2. Stable cardiomegaly with mild pulmonary vascular congestion. 3. Bibasilar atelectasis and/or infiltrates. Small left pleural effusion cannot be excluded. No interim change from prior exam.   Electronically Signed   By: Maisie Fus  Register   On: 02/18/2015 08:07     ASSESSMENT / PLAN:  PULMONARY OETT 3/8 >> A: Acute Hypoxic Respiratory Failure ?Pulmonary Edema improved P:   Lower RR to 25 SBTs once off pressors   CARDIOVASCULAR LIJ CVL 3/8 >> RIJ Temp HD Cath 3/8 >> Right Femoral A-line 3/8 >> A: Septic Shock Chronic dCHF Severe pulm htn Echo  3/10 >> RVSP 62, dilated RV P:  Pressors; Levophed gtt Add vaso Map goal 60-65 CRRT per renal    RENAL A:   Acute Renal Failure/ATN; likely 2/2 sepsis  Anuric on CRRT Hyponatremia AG Metabolic Acidosis 2/2 acute renal failure/lactic acidosis P:   ccvvhd Keep even to slight neg balance  GASTROINTESTINAL A:   Obesity GI PPx Protein calorie mal nutrition P:   PPI TF to goal Chk c diff  HEMATOLOGIC A:   DVT PPx P:  Heparin [place] CBC  INFECTIOUS A:   Purulent LE Cellulitis Sepsis/septic Shock Gram neg septic shock P:   BCx2  3/7>>morganella  UC  3/8>> Sputum 3/8>> Abx: 3/7 vanc>>3/9 3/7 zoysn>>3/9 3/8 Imipenem>>>  D/w Ortho Can change to ceftx once clinical improvement   ENDOCRINE A:  DM P:   SSI No evidence rel AI  NEUROLOGIC A:   Lethargic P:   RASS goal: 0 fent drip, WUA    FAMILY  - Updates: 3/8 updated brother  - Inter-disciplinary family meet or Palliative Care meeting due by:  day 7  The patient is critically ill with multiple organ systems failure and requires high complexity decision making for assessment and support, frequent evaluation and titration of therapies, application of advanced monitoring technologies  and extensive interpretation of multiple databases. Critical Care Time devoted to patient care services described in this note independent of APP time is 35 minutes.    Oretha Milch MD

## 2015-02-19 NOTE — Progress Notes (Signed)
Patient ID: Bryce [name], male   DOB: 08/05/1946, 69 y.o.   MRN: [id number] BP 104/68 mmHg  Pulse 77  Temp(Src) 97.1 F (36.2 C) (Axillary)  Resp 35  Ht 5\' 7"  (1.702 m)  Wt 115.4 kg (254 lb 6.6 oz)  BMI 39.84 kg/m2  SpO2 98%  Overnight events noted and interval labs reviewed. CVP 14 (down from 19 yesterday) 1 out of 2 cultures positive for Morganella (03/04/2015)-remains on broad-spectrum antibiotic therapy.  1. Acute renal failure: Appears to be hemodynamically mediated with evolution to ischemic ATN- Remains anuric/without evidence of renal recovery--continue current prescription of CRRT with heparin for extracorporeal anticoagulation. Attempt increase of ultrafiltration to -100 mL/h now with ability to wean down on pressors. 2. Sepsis: Started on empiric intravenous antibiotic and on pressors- cultures reviewed-possible contaminant (suspected source LE wounds) 3. Hyponatremia: Secondary to free water excretion defect of acute renal failure, improving on CRRT. 4. Lower extremity wounds: Seen earlier by Dr. Carleene Cooperuda-management noted with Profore compression wraps to be changed twice a week.  Zetta BillsJay Aldine Grainger MD [facility]. Office # [phone] Pager # [phone] 8:25 AM

## 2015-02-20 ENCOUNTER — Inpatient Hospital Stay (HOSPITAL_COMMUNITY): Payer: Commercial Managed Care - HMO

## 2015-02-20 DIAGNOSIS — J9601 Acute respiratory failure with hypoxia: Secondary | ICD-10-CM

## 2015-02-20 LAB — BASIC METABOLIC PANEL
ANION GAP: 8 (ref 5–15)
BUN: 40 mg/dL — ABNORMAL HIGH (ref 6–23)
CO2: 24 mmol/L (ref 19–32)
Calcium: 8.5 mg/dL (ref 8.4–10.5)
Chloride: 103 mmol/L (ref 96–112)
Creatinine, Ser: 1.75 mg/dL — ABNORMAL HIGH (ref 0.50–1.35)
GFR calc Af Amer: 45 mL/min — ABNORMAL LOW (ref >90)
GFR calc non Af Amer: 39 mL/min — ABNORMAL LOW (ref >90)
GLUCOSE: 157 mg/dL — AB (ref 70–99)
POTASSIUM: 3.9 mmol/L (ref 3.5–5.1)
Sodium: 135 mmol/L (ref 135–145)

## 2015-02-20 LAB — POCT ACTIVATED CLOTTING TIME
ACTIVATED CLOTTING TIME: 227 s
ACTIVATED CLOTTING TIME: 214 s
ACTIVATED CLOTTING TIME: 202 s
Activated Clotting Time: 202 seconds
Activated Clotting Time: 208 seconds
Activated Clotting Time: 214 seconds
Activated Clotting Time: 220 seconds
Activated Clotting Time: 220 seconds
Activated Clotting Time: 233 seconds
Activated Clotting Time: 233 seconds
Activated Clotting Time: 239 seconds

## 2015-02-20 LAB — CBC
HEMATOCRIT: 44.4 % (ref 39.0–52.0)
HEMOGLOBIN: 14.3 g/dL (ref 13.0–17.0)
MCH: 28.8 pg (ref 26.0–34.0)
MCHC: 32.2 g/dL (ref 30.0–36.0)
MCV: 89.3 fL (ref 78.0–100.0)
Platelets: 180 10*3/uL (ref 150–400)
RBC: 4.97 MIL/uL (ref 4.22–5.81)
RDW: 17.6 % — AB (ref 11.5–15.5)
WBC: 14.9 10*3/uL — ABNORMAL HIGH (ref 4.0–10.5)

## 2015-02-20 LAB — GLUCOSE, CAPILLARY
GLUCOSE-CAPILLARY: 138 mg/dL — AB (ref 70–99)
GLUCOSE-CAPILLARY: 157 mg/dL — AB (ref 70–99)
Glucose-Capillary: 105 mg/dL — ABNORMAL HIGH (ref 70–99)
Glucose-Capillary: 126 mg/dL — ABNORMAL HIGH (ref 70–99)
Glucose-Capillary: 126 mg/dL — ABNORMAL HIGH (ref 70–99)
Glucose-Capillary: 142 mg/dL — ABNORMAL HIGH (ref 70–99)
Glucose-Capillary: 150 mg/dL — ABNORMAL HIGH (ref 70–99)
Glucose-Capillary: 164 mg/dL — ABNORMAL HIGH (ref 70–99)

## 2015-02-20 LAB — RENAL FUNCTION PANEL
ALBUMIN: 1.6 g/dL — AB (ref 3.5–5.2)
ANION GAP: 9 (ref 5–15)
BUN: 40 mg/dL — ABNORMAL HIGH (ref 6–23)
CHLORIDE: 100 mmol/L (ref 96–112)
CO2: 27 mmol/L (ref 19–32)
CREATININE: 1.79 mg/dL — AB (ref 0.50–1.35)
Calcium: 8.4 mg/dL (ref 8.4–10.5)
GFR, EST AFRICAN AMERICAN: 43 mL/min — AB (ref >90)
GFR, EST NON AFRICAN AMERICAN: 38 mL/min — AB (ref >90)
GLUCOSE: 179 mg/dL — AB (ref 70–99)
POTASSIUM: 4.1 mmol/L (ref 3.5–5.1)
Phosphorus: 2.1 mg/dL — ABNORMAL LOW (ref 2.3–4.6)
Sodium: 136 mmol/L (ref 135–145)

## 2015-02-20 LAB — APTT: APTT: 65 s — AB (ref 24–37)

## 2015-02-20 MED ORDER — DOCUSATE SODIUM 50 MG/5ML PO LIQD
100.0000 mg | Freq: Two times a day (BID) | ORAL | Status: DC | PRN
  Filled 2015-02-20: qty 10

## 2015-02-20 NOTE — Progress Notes (Signed)
Patient ID: Bryce MountJoseph P [name], male   DOB: 04/09/1946, 69 y.o.   MRN: [id number]  BP 111/68 mmHg  Pulse 98  Temp(Src) 97.8 F (36.6 C) (Axillary)  Resp 25  Ht 5\' 7"  (1.702 m)  Wt 111.2 kg (245 lb 2.4 oz)  BMI 38.39 kg/m2  SpO2 100%  CRRT prescription reviewed and changes made as below. 1. Acute renal failure: Appears to be hemodynamically mediated with evolution to ischemic ATN- he continues to remain anuric and on low-dose pressors. I will continue his CRRT at the current prescription at this time with efforts at ultrafiltration (CVP remains elevated). 2. Sepsis: Blood cultures negative, urine cultures negative and C. difficile testing negative. He remains on broad-spectrum antibiotic therapy with Primaxin for his purulent lower extremity cellulitis and lower extremity ulcers. 3. Hyponatremia: Secondary to free water excretion defect of acute renal failure, corrected with CRRT. 4. Lower extremity wounds: Wound care as directed-Profore dressings with twice a week changes.  Zetta BillsJay Ardyn Forge MD [facility]. Office # [phone] Pager # [phone] 8:14 AM

## 2015-02-20 NOTE — Progress Notes (Signed)
ANTIBIOTIC CONSULT NOTE - FOLLOW UP  Pharmacy Consult for Primaxin Indication: bacteremia  Allergies  Allergen Reactions  . Penicillins Other (See Comments)    "I fall out"    Patient Measurements: Height: 5\' 7"  (170.2 cm) Weight: 245 lb 2.4 oz (111.2 kg) IBW/kg (Calculated) : 66.1  Vital Signs: Temp: 98.8 F (37.1 C) (03/12 1200) Temp Source: Axillary (03/12 1200) BP: 99/56 mmHg (03/12 1143) Pulse Rate: 86 (03/12 1143) Intake/Output from previous day: 03/11 0701 - 03/12 0700 In: 2918.3 [I.V.:1118.3; NG/GT:1500; IV Piggyback:300] Out: 5231  Intake/Output from this shift: Total I/O In: 717.1 [I.V.:302.1; NG/GT:315; IV Piggyback:100] Out: 1526 [Other:1526]  Labs:  Recent Labs  02/19/15 0400 02/19/15 1630 02/20/15 0415  WBC 15.4*  --  14.9*  HGB 15.0  --  14.3  PLT 213  --  180  CREATININE 2.23* 1.88* 1.75*   Estimated Creatinine Clearance: 48.7 mL/min (by C-G formula based on Cr of 1.75).  Assessment: 69yo male started on vancomycin and zosyn for bilateral LE cellulitis and septic shock > grew GNR in 1 of 2 blood cx > abx switched to Primaxin. CT legs performed to r/o necrotizing fascitis (see below). He continues on CVVHD. Pt now afebrile, WBC elevated trending down.  Per notes, will de-escalate abx once clinically improved.  Vancomycin 3/7>> 3/9 Zosyn 3/7>>3/9 Primaxin 3/9>>  3/7 blood cx x2>> 1/2 GNR 3/8 urine cx>>neg (final) 3/10 blood cx x2 >> ngdt 3/11 Cdiff PCR >> neg  3/9 CT right and left legs: soft tissue edema, may be reactive vs secondary to cellulitis, no drainable fluid collection  Goal of Therapy:  Resolution of infection  Plan:  Continue Primaxin 500mg  IV Q8h Follow renal plans, C&S, LOT De-escalate abx upon clinical improvement  Waynette Butteryegan K. Shakelia Scrivner, PharmD Clinical Pharmacy Resident Pager: [phone] 02/20/2015 3:51 PM

## 2015-02-20 NOTE — Progress Notes (Signed)
PULMONARY / CRITICAL CARE MEDICINE   Name: Bryce [name] MRN: [id number] DOB: 1946-10-02    ADMISSION DATE:  02/09/2015 CONSULTATION DATE:  3/8  REFERRING MD : Triad  CHIEF COMPLAINT:  AMS  INITIAL PRESENTATION: 69 y/o M w/ PMHx of HTN, HLD, DM type II, chronic dCHF, BPH, and RBBB, admitted w/ purulent LE cellulitis, and oliguric AKI (Cr >6). PCCM consulted for acute hypoxic respiratory failure and septic shock.   STUDIES:  3/8 [place] RUQ>>>No cholelithiasis or sonographic evidence of acute cholecystitis. 2. Heterogeneous hepatic echogenicity as can be seen with hepatocellular disease. 3/9 CT lower ext>>>soft tissue edema , no drainable fluid collection.  SIGNIFICANT EVENTS: 3/8 >> Transfer to ICU, shock, acidotic, intubation, pressors 3/8- gram neg in blood, cvvhd 3/9 - CT done legs, continued high pressors  SUBJECTIVE: afebrile On lower levo gtt Tolerating volume removal on CVVH  VITAL SIGNS: Temp:  [97.4 F (36.3 C)-98.6 F (37 C)] 98.2 F (36.8 C) (03/12 0800) Pulse Rate:  [25-98] 88 (03/12 0837) Resp:  [25-26] 25 (03/12 0837) BP: (99-111)/(57-68) 99/57 mmHg (03/12 0837) SpO2:  [91 %-100 %] 100 % (03/12 0837) Arterial Line BP: (87-124)/(50-80) 98/56 mmHg (03/12 0700) FiO2 (%):  [40 %] 40 % (03/12 0837) Weight:  [111.2 kg (245 lb 2.4 oz)] 111.2 kg (245 lb 2.4 oz) (03/12 0409)   HEMODYNAMICS: CVP:  [13 mmHg-23 mmHg] 13 mmHg   VENTILATOR SETTINGS: Vent Mode:  [-] PRVC FiO2 (%):  [40 %] 40 % Set Rate:  [25 bmp] 25 bmp Vt Set:  [530 mL] 530 mL PEEP:  [5 cmH20] 5 cmH20 Plateau Pressure:  [20 cmH20-29 cmH20] 20 cmH20   INTAKE / OUTPUT:  Intake/Output Summary (Last 24 hours) at 02/20/15 0915 Last data filed at 02/20/15 0800  Gross per 24 hour  Intake 2725.7 ml  Output   5125 ml  Net -2399.3 ml    PHYSICAL EXAMINATION: General:  Obese AAM, sedated Neuro:  rass -2, not following commands, opens eyes to voice HEENT:  increasedJVD Cardiovascular:  RRR distant   Lungs:  More coarse, RR 35 with autoPEEP Abdomen:  obses +bs, no r/g Musculoskeletal:  intact Skin:  Lower ext cellulitis, wrapped  LABS:  CBC  Recent Labs Lab 02/17/15 0948 02/19/15 0400 02/20/15 0415  WBC 9.6 15.4* 14.9*  HGB 9.7* 15.0 14.3  HCT 29.3* 44.6 44.4  PLT 163 213 180   BMET  Recent Labs Lab 02/19/15 0400 02/19/15 1630 02/20/15 0415  NA 135 137 135  K 3.8 3.9 3.9  CL 102 103 103  CO2 BUN 42* 39* 40*  CREATININE 2.23* 1.88* 1.75*  GLUCOSE 171* 150* 157*   Electrolytes  Recent Labs Lab 02/18/15 0024 02/18/15 1530 02/19/15 0400 02/19/15 1630 02/20/15 0415  CALCIUM 7.8* 7.9* 8.4 8.3* 8.5  PHOS 4.9* 2.9  --  3.1  --    Sepsis Markers  Recent Labs Lab 02/14/2015 2005 02/16/15 0900 02/17/15 0400 02/17/15 1000 02/18/15 0430  LATICACIDVEN 2.6* 3.4*  --  2.2*  --   PROCALCITON  --  12.09 8.06  --  8.46   ABG  Recent Labs Lab 02/18/15 0738 02/19/15 0335 02/19/15 1012  PHART 7.310* 7.333* 7.275*  PCO2ART 38.9 42.1 49.6*  PO2ART 88.0 113.0* 74.0*   Liver Enzymes  Recent Labs Lab 02/17/2015 1222 02/16/15 0323  02/18/15 1530 02/19/15 0400 02/19/15 1630  AST 57* 56*  --   --  173*  --   ALT 203* 167*  --   --  257*  --   ALKPHOS 112 100  --   --  105  --   BILITOT 6.1* 6.6*  --   --  8.6*  --   ALBUMIN 2.1* 2.0*  < > 1.5* 1.6* 1.6*  < > = values in this interval not displayed. Cardiac Enzymes  Recent Labs Lab 02/16/15 0900 02/16/15 1645 02/16/15 2145  TROPONINI 0.15* 0.15* 0.16*   Glucose  Recent Labs Lab 02/19/15 0759 02/19/15 1117 02/19/15 1611 02/19/15 1948 02/19/15 2351 02/20/15 0405  GLUCAP 135* 124* 119* 138* 142* 126*    Imaging Dg Chest Port 1 View  02/19/2015   CLINICAL DATA:  Pulmonary edema  EXAM: PORTABLE CHEST - 1 VIEW  COMPARISON:  02/18/2015  FINDINGS: Cardiac shadow is at the upper limits of normal in size. An endotracheal tube, nasogastric catheter, left jugular and right jugular central  lines are again seen and stable. The endotracheal tube is at the level of the aortic knob approximately 3.3 cm above the carina.  The lungs are well aerated bilaterally. Mild patchy atelectasis is noted in the right lung base. Improved aeration in the left lung base is noted. No new focal abnormality is seen.  IMPRESSION: Patchy right basilar atelectasis.  Tubes and lines as described.   Electronically Signed   By: Alcide CleverMark  Lukens M.D.   On: 02/19/2015 07:14     ASSESSMENT / PLAN:  PULMONARY OETT 3/8 >> A: Acute Hypoxic Respiratory Failure ?Pulmonary Edema improved P:   Lower RR to 25 SBTs once off pressors   CARDIOVASCULAR LIJ CVL 3/8 >> RIJ Temp HD Cath 3/8 >> Right Femoral A-line 3/8 >> A: Septic Shock Chronic dCHF Severe pulm htn Echo  3/10 >> RVSP 62, dilated RV P:  taper Levophed gtt Add vaso Map goal 60-65   RENAL A:   Acute Renal Failure/ATN; likely 2/2 sepsis  Anuric on CRRT Hyponatremia AG Metabolic Acidosis 2/2 acute renal failure/lactic acidosis P:   CRRT per renal Keep even to slight neg balance  GASTROINTESTINAL A:   Obesity GI PPx Protein calorie mal nutrition P:   PPI TF to goal   HEMATOLOGIC A:   DVT PPx P:  Heparin [place] CBC  INFECTIOUS A:   Purulent LE Cellulitis Sepsis/septic Shock Gram neg septic shock P:   BCx2  3/7>>morganella  UC  3/8>> Sputum 3/8>>  c diff neg 3/11  Abx: 3/7 vanc>>3/9 3/7 zoysn>>3/9 3/8 Imipenem>>>  D/w Ortho Can change to ceftx once clinical improvement   ENDOCRINE A:  DM P:   SSI No evidence rel AI  NEUROLOGIC A:   Lethargic P:   RASS goal: 0 fent drip, WUA    FAMILY  - Updates: 3/12 updated  Sister, niece - daughter is estranged & not available for decision making  - Inter-disciplinary family meet or Palliative Care meeting due by:  3/14  The patient is critically ill with multiple organ systems failure and requires high complexity decision making for assessment and support,  frequent evaluation and titration of therapies, application of advanced monitoring technologies and extensive interpretation of multiple databases. Critical Care Time devoted to patient care services described in this note independent of APP time is 35 minutes.    Oretha MilchALVA,Stacie Templin V. MD

## 2015-02-21 ENCOUNTER — Inpatient Hospital Stay (HOSPITAL_COMMUNITY): Payer: Commercial Managed Care - HMO

## 2015-02-21 LAB — RENAL FUNCTION PANEL
ANION GAP: 8 (ref 5–15)
Albumin: 1.6 g/dL — ABNORMAL LOW (ref 3.5–5.2)
Albumin: 1.5 g/dL — ABNORMAL LOW (ref 3.5–5.2)
Anion gap: 7 (ref 5–15)
BUN: 38 mg/dL — ABNORMAL HIGH (ref 6–23)
BUN: 38 mg/dL — ABNORMAL HIGH (ref 6–23)
CO2: 23 mmol/L (ref 19–32)
CO2: 27 mmol/L (ref 19–32)
Calcium: 8.2 mg/dL — ABNORMAL LOW (ref 8.4–10.5)
Calcium: 8.4 mg/dL (ref 8.4–10.5)
Chloride: 104 mmol/L (ref 96–112)
Chloride: 100 mmol/L (ref 96–112)
Creatinine, Ser: 1.51 mg/dL — ABNORMAL HIGH (ref 0.50–1.35)
Creatinine, Ser: 1.56 mg/dL — ABNORMAL HIGH (ref 0.50–1.35)
GFR calc Af Amer: 53 mL/min — ABNORMAL LOW (ref >90)
GFR calc Af Amer: 51 mL/min — ABNORMAL LOW (ref >90)
GFR calc non Af Amer: 46 mL/min — ABNORMAL LOW (ref >90)
GFR calc non Af Amer: 44 mL/min — ABNORMAL LOW (ref >90)
Glucose, Bld: 157 mg/dL — ABNORMAL HIGH (ref 70–99)
Glucose, Bld: 189 mg/dL — ABNORMAL HIGH (ref 70–99)
PHOSPHORUS: 1.9 mg/dL — AB (ref 2.3–4.6)
POTASSIUM: 4.2 mmol/L (ref 3.5–5.1)
Phosphorus: 4.3 mg/dL (ref 2.3–4.6)
Potassium: 4.9 mmol/L (ref 3.5–5.1)
SODIUM: 134 mmol/L — AB (ref 135–145)
Sodium: 135 mmol/L (ref 135–145)

## 2015-02-21 LAB — CBC
HEMATOCRIT: 45.8 % (ref 39.0–52.0)
Hemoglobin: 14.3 g/dL (ref 13.0–17.0)
MCH: 28.7 pg (ref 26.0–34.0)
MCHC: 31.2 g/dL (ref 30.0–36.0)
MCV: 91.8 fL (ref 78.0–100.0)
Platelets: 134 10*3/uL — ABNORMAL LOW (ref 150–400)
RBC: 4.99 MIL/uL (ref 4.22–5.81)
RDW: 17.8 % — AB (ref 11.5–15.5)
WBC: 17.7 10*3/uL — ABNORMAL HIGH (ref 4.0–10.5)

## 2015-02-21 LAB — CULTURE, BLOOD (ROUTINE X 2): CULTURE: NO GROWTH

## 2015-02-21 LAB — POCT ACTIVATED CLOTTING TIME
ACTIVATED CLOTTING TIME: 202 s
ACTIVATED CLOTTING TIME: 208 s
ACTIVATED CLOTTING TIME: 214 s
Activated Clotting Time: 196 seconds
Activated Clotting Time: 220 seconds

## 2015-02-21 LAB — GLUCOSE, CAPILLARY
GLUCOSE-CAPILLARY: 147 mg/dL — AB (ref 70–99)
Glucose-Capillary: 140 mg/dL — ABNORMAL HIGH (ref 70–99)
Glucose-Capillary: 158 mg/dL — ABNORMAL HIGH (ref 70–99)
Glucose-Capillary: 165 mg/dL — ABNORMAL HIGH (ref 70–99)

## 2015-02-21 LAB — APTT: aPTT: 51 seconds — ABNORMAL HIGH (ref 24–37)

## 2015-02-21 MED ORDER — HYDROCORTISONE NA SUCCINATE PF 100 MG IJ SOLR
50.0000 mg | Freq: Four times a day (QID) | INTRAMUSCULAR | Status: DC
  Administered 2015-02-21 – 2015-02-25 (×16): 50 mg via INTRAVENOUS
  Filled 2015-02-21 (×21): qty 1

## 2015-02-21 MED ORDER — SODIUM PHOSPHATE 3 MMOLE/ML IV SOLN
30.0000 mmol | Freq: Once | INTRAVENOUS | Status: AC
  Administered 2015-02-21: 30 mmol via INTRAVENOUS
  Filled 2015-02-21: qty 10

## 2015-02-21 NOTE — Progress Notes (Signed)
PULMONARY / CRITICAL CARE MEDICINE   Name: Bryce [name] MRN: [id number] DOB: 08-01-1946    ADMISSION DATE:  03-03-2015 CONSULTATION DATE:  3/8  REFERRING MD : Triad  CHIEF COMPLAINT:  AMS  INITIAL PRESENTATION: 69 y/o M w/ PMHx of HTN, HLD, DM type II, chronic dCHF, BPH, and RBBB, admitted w/ purulent LE cellulitis, and oliguric AKI (Cr >6). PCCM consulted for acute hypoxic respiratory failure and septic shock.   STUDIES:  3/8 [place] RUQ>>>No cholelithiasis or sonographic evidence of acute cholecystitis. 2. Heterogeneous hepatic echogenicity as can be seen with hepatocellular disease. 3/9 CT lower ext>>>soft tissue edema , no drainable fluid collection.  SIGNIFICANT EVENTS: 3/8 >> Transfer to ICU, shock, acidotic, intubation, pressors 3/8- gram neg in blood, cvvhd 3/9 - CT done legs, continued high pressors  SUBJECTIVE: afebrile On  levo gtt  Tolerating 100/h volume removal on CVVH  VITAL SIGNS: Temp:  [97.6 F (36.4 C)-98.8 F (37.1 C)] 98.7 F (37.1 C) (03/13 0800) Pulse Rate:  [86-89] 89 (03/12 1623) Resp:  [20-25] 25 (03/13 0800) BP: (97-99)/(56) 97/56 mmHg (03/12 1623) SpO2:  [96 %-100 %] 99 % (03/13 0800) Arterial Line BP: (87-106)/(50-63) 91/52 mmHg (03/13 0800) FiO2 (%):  [40 %] 40 % (03/13 0353) Weight:  [107.5 kg (236 lb 15.9 oz)] 107.5 kg (236 lb 15.9 oz) (03/13 0420)   HEMODYNAMICS: CVP:  [10 mmHg-16 mmHg] 10 mmHg   VENTILATOR SETTINGS: Vent Mode:  [-] PRVC FiO2 (%):  [40 %] 40 % Set Rate:  [25 bmp] 25 bmp Vt Set:  [530 mL] 530 mL PEEP:  [5 cmH20] 5 cmH20 Plateau Pressure:  [18 cmH20-21 cmH20] 18 cmH20   INTAKE / OUTPUT:  Intake/Output Summary (Last 24 hours) at 02/21/15 0843 Last data filed at 02/21/15 0800  Gross per 24 hour  Intake 2727.7 ml  Output   4789 ml  Net -2061.3 ml    PHYSICAL EXAMINATION: General:  Obese AAM, sedated Neuro:  rass -2, not following commands, opens eyes to voice HEENT:  increasedJVD Cardiovascular:  RRR  distant  Lungs:  BL scattered rhonchi Abdomen:  obses +bs, no r/g Musculoskeletal:  intact Skin:  Lower ext cellulitis, wrapped  LABS:  CBC  Recent Labs Lab 02/19/15 0400 02/20/15 0415 02/21/15 0440  WBC 15.4* 14.9* 17.7*  HGB 15.0 14.3 14.3  HCT 44.6 44.4 45.8  PLT 213 180 134*   BMET  Recent Labs Lab 02/20/15 0415 02/20/15 1802 02/21/15 0440  NA 135 136 135  K 3.9 4.1 4.2  CL 103 100 104  CO2 BUN 40* 40* 38*  CREATININE 1.75* 1.79* 1.51*  GLUCOSE 157* 179* 157*   Electrolytes  Recent Labs Lab 02/19/15 1630 02/20/15 0415 02/20/15 1802 02/21/15 0440  CALCIUM 8.3* 8.5 8.4 8.2*  PHOS 3.1  --  2.1* 1.9*   Sepsis Markers  Recent Labs Lab 03-03-2015 2005 02/16/15 0900 02/17/15 0400 02/17/15 1000 02/18/15 0430  LATICACIDVEN 2.6* 3.4*  --  2.2*  --   PROCALCITON  --  12.09 8.06  --  8.46   ABG  Recent Labs Lab 02/18/15 0738 02/19/15 0335 02/19/15 1012  PHART 7.310* 7.333* 7.275*  PCO2ART 38.9 42.1 49.6*  PO2ART 88.0 113.0* 74.0*   Liver Enzymes  Recent Labs Lab 03-03-15 1222 02/16/15 0323  02/19/15 0400 02/19/15 1630 02/20/15 1802 02/21/15 0440  AST 57* 56*  --  173*  --   --   --   ALT 203* 167*  --  257*  --   --   --  ALKPHOS 112 100  --  105  --   --   --   BILITOT 6.1* 6.6*  --  8.6*  --   --   --   ALBUMIN 2.1* 2.0*  < > 1.6* 1.6* 1.6* 1.6*  < > = values in this interval not displayed. Cardiac Enzymes  Recent Labs Lab 02/16/15 0900 02/16/15 1645 02/16/15 2145  TROPONINI 0.15* 0.15* 0.16*   Glucose  Recent Labs Lab 02/20/15 0823 02/20/15 1158 02/20/15 1633 02/20/15 1945 02/20/15 2324 02/21/15 0431  GLUCAP 126* 164* 157* 150* 105* 147*    Imaging Dg Chest Port 1 View  02/20/2015   CLINICAL DATA:  69 year old male with a history of acute respiratory failure.  EXAM: PORTABLE CHEST - 1 VIEW  COMPARISON:  02/19/2015, 02/18/2015, 02/17/2015  FINDINGS: Cardiomediastinal silhouette unchanged. Cardiomegaly  persists. Accentuation of the mediastinum and right heart given the left rotation of the patient.  Unchanged position of the endotracheal tube, terminating 3.6 cm above the carina.  Unchanged right IJ sheath, appearing to terminate in superior vena cava. Unchanged left IJ central catheter, which appears to terminate in the left brachial cephalic vein.  Unchanged enteric tube terminating in the left upper quadrant.  Airspace disease/interstitial disease of the right medial base. Retrocardiac region not well evaluated.  New atherosclerotic calcifications of the aortic arch.  No evidence of pneumothorax.  IMPRESSION: Unchanged position of the endotracheal tube and the other support apparatus, as above.  Persisting cardiomegaly, accentuated by the leftward rotation of the patient. Atherosclerosis.  Basilar opacity, worst on the left. This may represent a combination of atelectasis, pleural fluid, and/or consolidation.  Signed,  Yvone NeuJaime S. Loreta AveWagner, DO  [facility]  [facility]   Electronically Signed   By: Gilmer MorJaime  Wagner D.O.   On: 02/20/2015 09:08     ASSESSMENT / PLAN:  PULMONARY OETT 3/8 >> A: Acute Hypoxic Respiratory Failure ?Pulmonary Edema improved P:   Lower RR to 25 SBTs once off pressors   CARDIOVASCULAR LIJ CVL 3/8 >> RIJ Temp HD Cath 3/8 >> Right Femoral A-line 3/8 >> A: Septic Shock Chronic dCHF Severe pulm htn Echo  3/10 >> RVSP 62, dilated RV P:  taper Levophed gtt Added vaso & stress steroids Map goal 60-65   RENAL A:   Acute Renal Failure/ATN; likely 2/2 sepsis  Anuric on CRRT Hyponatremia AG Metabolic Acidosis 2/2 acute renal failure/lactic acidosis P:   CRRT per renal Keep even to slight neg balance  GASTROINTESTINAL A:   Obesity GI PPx Protein calorie mal nutrition P:   PPI TF to goal   HEMATOLOGIC A:   DVT PPx P:  Heparin [place] CBC  INFECTIOUS A:   Purulent LE Cellulitis Sepsis/septic Shock Gram neg  septic shock P:   BCx2  3/7>>morganella  UC  3/8>>ng  c diff neg 3/11  Abx: 3/7 vanc>>3/9 3/7 zoysn>>3/9 3/8 Imipenem>>>  D/w Ortho Can change to ceftx once clinical improvement/ off pressors   ENDOCRINE A:  DM P:   SSI No evidence rel AI but will use steroids since pressor dependent  NEUROLOGIC A:   Lethargic P:   RASS goal: 0 fent drip, WUA    FAMILY  - Updates: 3/12 updated  Sister, niece - daughter is estranged & not available for decision making  - Inter-disciplinary family meet or Palliative Care meeting due by:  3/14  The patient is critically ill with multiple organ systems failure and requires high complexity decision making for assessment and  support, frequent evaluation and titration of therapies, application of advanced monitoring technologies and extensive interpretation of multiple databases. Critical Care Time devoted to patient care services described in this note independent of APP time is 35 minutes.    Oretha Milch MD

## 2015-02-21 NOTE — Progress Notes (Signed)
Patient ID: Bryce [name], male   DOB: [birth year], 69 y.o.   MRN: [id number]  [facility] Progress Note    Assessment/ Plan:   1. Acute renal failure: Appears to be hemodynamically mediated with evolution to ischemic ATN- he continues to remain anuric and on low-dose pressors. Will decrease ultrafiltration rate to allow weaning off of pressors. When off pressors, will consider transitioning to intermittent dialysis as we await renal recovery. He is a very marginal patient for chronic hemodialysis therapy with his limited baseline functional status. 2. Sepsis: Blood cultures negative, urine cultures negative and C. difficile testing negative. He remains on broad-spectrum antibiotic therapy with Primaxin for his purulent lower extremity cellulitis and lower extremity ulcers. 3. Hyponatremia: Secondary to free water excretion defect of acute renal failure, corrected with CRRT. 4. Lower extremity wounds: Wound care as directed by Dr.Duda-Profore dressings with twice a week changes.  Subjective:   Events from overnight noted-phosphorus replacement undertaken for CRRT losses. No acute changes in  patient's clinical status    Objective:   BP 97/56 mmHg  Pulse 89  Temp(Src) 98.7 F (37.1 C) (Axillary)  Resp 25  Ht  (1.702 m)  Wt 107.5 kg (236 lb 15.9 oz)  BMI 37.11 kg/m2  SpO2 99%  Intake/Output Summary (Last 24 hours) at 02/21/15 0851 Last data filed at 02/21/15 0800  Gross per 24 hour  Intake 2727.7 ml  Output   4789 ml  Net -2061.3 ml   Weight change: -3.7 kg (-8 lb 2.5 oz)  Physical Exam: AOZ:[id number], mildly sedated--awakens to voice and nods to questions CVS: Pulse regular in rate and rhythm-distant S1-S2 Resp: Coarse breath sounds-no distinct rales Abd: Soft, obese, nontender Ext: 1-2+ upper and lower extremity edema, Profore dressing bilateral lower extremities  Imaging: Dg Chest Port 1 View  02/21/2015   CLINICAL DATA:  Acute respiratory failure,  hypertension, diabetes, diastolic CHF  EXAM: PORTABLE CHEST - 1 VIEW  COMPARISON:  Portable exam 0621 hr compared to 02/20/2015  FINDINGS: Tip of endotracheal tube projects 3.5 cm above carina.  Nasogastric tube extends into stomach.  BILATERAL jugular central venous catheters with tips projecting over SVC.  Enlargement of cardiac silhouette.  Rotated to the LEFT.  Persistent BILATERAL lower lobe opacities LEFT greater than RIGHT question atelectasis versus consolidation.  Remaining lungs clear.  No definite pneumothorax.  Small LEFT pleural effusion not excluded.  Atherosclerotic calcification aortic arch.  IMPRESSION: Enlargement of cardiac silhouette.  Persistent BILATERAL lower lobe opacities question atelectasis versus consolidation, greater on LEFT.   Electronically Signed   By: Ulyses Southward M.D.   On: 02/21/2015 08:28   Dg Chest Port 1 View  02/20/2015   CLINICAL DATA:  69 year old male with a history of acute respiratory failure.  EXAM: PORTABLE CHEST - 1 VIEW  COMPARISON:  02/19/2015, 02/18/2015, 02/17/2015  FINDINGS: Cardiomediastinal silhouette unchanged. Cardiomegaly persists. Accentuation of the mediastinum and right heart given the left rotation of the patient.  Unchanged position of the endotracheal tube, terminating 3.6 cm above the carina.  Unchanged right IJ sheath, appearing to terminate in superior vena cava. Unchanged left IJ central catheter, which appears to terminate in the left brachial cephalic vein.  Unchanged enteric tube terminating in the left upper quadrant.  Airspace disease/interstitial disease of the right medial base. Retrocardiac region not well evaluated.  New atherosclerotic calcifications of the aortic arch.  No evidence of pneumothorax.  IMPRESSION: Unchanged position of the endotracheal tube and the other support apparatus, as above.  Persisting cardiomegaly, accentuated by the leftward rotation of the patient. Atherosclerosis.  Basilar opacity, worst on the left. This may  represent a combination of atelectasis, pleural fluid, and/or consolidation.  Signed,  Yvone NeuJaime S. Loreta AveWagner, DO  [facility]  [facility]   Electronically Signed   By: Gilmer MorJaime  Wagner D.O.   On: 02/20/2015 09:08    Labs: BMET  Recent Labs Lab 02/16/15 1100  02/18/15 0024 02/18/15 1530 02/19/15 0400 02/19/15 1630 02/20/15 0415 02/20/15 1802 02/21/15 0440  NA 129*  < > 135 135 135 137 135 136 135  K 3.2*  < > 3.8 3.7 3.8 3.9 3.9 4.1 4.2  CL 93*  < > 102 104 102 103 103 100 104  CO2 18*  < > 21 21 23 26 24 27 23   GLUCOSE 140*  < > 204* 181* 171* 150* 157* 179* 157*  BUN 130*  < > 57* 48* 42* 39* 40* 40* 38*  CREATININE 6.77*  < > 3.26* 2.62* 2.23* 1.88* 1.75* 1.79* 1.51*  CALCIUM 7.9*  < > 7.8* 7.9* 8.4 8.3* 8.5 8.4 8.2*  PHOS 10.0*  --  4.9* 2.9  --  3.1  --  2.1* 1.9*  < > = values in this interval not displayed. CBC  Recent Labs Lab 05-30-2015 1222  02/17/15 0948 02/19/15 0400 02/20/15 0415 02/21/15 0440  WBC 13.2*  < > 9.6 15.4* 14.9* 17.7*  NEUTROABS 11.6*  --  8.5* 13.3*  --   --   HGB 15.3  < > 9.7* 15.0 14.3 14.3  HCT 46.2  < > 29.3* 44.6 44.4 45.8  MCV 90.9  < > 88.5 87.8 89.3 91.8  PLT 222  < > 163 213 180 134*  < > = values in this interval not displayed.  Medications:    . antiseptic oral rinse  7 mL Mouth Rinse QID  . aspirin EC  81 mg Oral q morning - 10a  . chlorhexidine  15 mL Mouth Rinse BID  . feeding supplement (PRO-STAT SUGAR FREE 64)  60 mL Per Tube TID  . feeding supplement (VITAL HIGH PROTEIN)  1,000 mL Per Tube Q24H  . folic acid  1 mg Oral q morning - 10a  . imipenem-cilastatin  500 mg Intravenous 3 times per day  . insulin aspart  0-15 Units Subcutaneous 6 times per day  . pantoprazole sodium  40 mg Per Tube Q1200  . senna  1 tablet Oral BID  . sodium phosphate  Dextrose 5% IVPB  30 mmol Intravenous Once  . thiamine  100 mg Oral Daily   Zetta BillsJay Kennetha Pearman, MD 02/21/2015, 8:51 AM

## 2015-02-21 NOTE — Progress Notes (Signed)
eLink Physician-Brief Progress Note Patient Name: Bryce MountJoseph P [name] DOB: 06/23/1946 MRN: [id number]   Date of Service  02/21/2015  HPI/Events of Note  Hypophophosphatemia  eICU Interventions  Phos replaced     Intervention Category Intermediate Interventions: Electrolyte abnormality - evaluation and management  DETERDING,ELIZABETH 02/21/2015, 6:16 AM

## 2015-02-21 NOTE — Progress Notes (Signed)
Phosphorus level 1.9-Dr Deterding notified.

## 2015-02-22 ENCOUNTER — Inpatient Hospital Stay (HOSPITAL_COMMUNITY): Payer: Commercial Managed Care - HMO

## 2015-02-22 LAB — CBC
HEMATOCRIT: 43.9 % (ref 39.0–52.0)
HEMOGLOBIN: 14.1 g/dL (ref 13.0–17.0)
MCH: 29.4 pg (ref 26.0–34.0)
MCHC: 32.1 g/dL (ref 30.0–36.0)
MCV: 91.6 fL (ref 78.0–100.0)
Platelets: 146 10*3/uL — ABNORMAL LOW (ref 150–400)
RBC: 4.79 MIL/uL (ref 4.22–5.81)
RDW: 18 % — ABNORMAL HIGH (ref 11.5–15.5)
WBC: 22.3 10*3/uL — ABNORMAL HIGH (ref 4.0–10.5)

## 2015-02-22 LAB — POCT ACTIVATED CLOTTING TIME
ACTIVATED CLOTTING TIME: 196 s
ACTIVATED CLOTTING TIME: 208 s
ACTIVATED CLOTTING TIME: 208 s
Activated Clotting Time: 190 seconds
Activated Clotting Time: 190 seconds
Activated Clotting Time: 202 seconds
Activated Clotting Time: 208 seconds

## 2015-02-22 LAB — GLUCOSE, CAPILLARY
GLUCOSE-CAPILLARY: 156 mg/dL — AB (ref 70–99)
GLUCOSE-CAPILLARY: 214 mg/dL — AB (ref 70–99)
GLUCOSE-CAPILLARY: 155 mg/dL — AB (ref 70–99)
GLUCOSE-CAPILLARY: 186 mg/dL — AB (ref 70–99)
Glucose-Capillary: 136 mg/dL — ABNORMAL HIGH (ref 70–99)
Glucose-Capillary: 169 mg/dL — ABNORMAL HIGH (ref 70–99)
Glucose-Capillary: 173 mg/dL — ABNORMAL HIGH (ref 70–99)

## 2015-02-22 LAB — IRON AND TIBC
Iron: 49 ug/dL (ref 42–165)
Saturation Ratios: 26 % (ref 20–55)
TIBC: 192 ug/dL — ABNORMAL LOW (ref 215–435)
UIBC: 143 ug/dL (ref 125–400)

## 2015-02-22 LAB — RENAL FUNCTION PANEL
ALBUMIN: 1.6 g/dL — AB (ref 3.5–5.2)
ALBUMIN: 1.5 g/dL — AB (ref 3.5–5.2)
Anion gap: 8 (ref 5–15)
Anion gap: 7 (ref 5–15)
BUN: 41 mg/dL — ABNORMAL HIGH (ref 6–23)
BUN: 42 mg/dL — AB (ref 6–23)
CALCIUM: 8.5 mg/dL (ref 8.4–10.5)
CHLORIDE: 102 mmol/L (ref 96–112)
CO2: 24 mmol/L (ref 19–32)
CO2: 24 mmol/L (ref 19–32)
CREATININE: 1.5 mg/dL — AB (ref 0.50–1.35)
CREATININE: 1.49 mg/dL — AB (ref 0.50–1.35)
Calcium: 8.4 mg/dL (ref 8.4–10.5)
Chloride: 104 mmol/L (ref 96–112)
GFR calc Af Amer: 54 mL/min — ABNORMAL LOW (ref >90)
GFR calc non Af Amer: 47 mL/min — ABNORMAL LOW (ref >90)
GFR, EST AFRICAN AMERICAN: 54 mL/min — AB (ref >90)
GFR, EST NON AFRICAN AMERICAN: 46 mL/min — AB (ref >90)
GLUCOSE: 203 mg/dL — AB (ref 70–99)
Glucose, Bld: 226 mg/dL — ABNORMAL HIGH (ref 70–99)
PHOSPHORUS: 2.7 mg/dL (ref 2.3–4.6)
Phosphorus: 2.1 mg/dL — ABNORMAL LOW (ref 2.3–4.6)
Potassium: 5.4 mmol/L — ABNORMAL HIGH (ref 3.5–5.1)
Potassium: 4.5 mmol/L (ref 3.5–5.1)
Sodium: 134 mmol/L — ABNORMAL LOW (ref 135–145)
Sodium: 135 mmol/L (ref 135–145)

## 2015-02-22 LAB — POCT I-STAT 3, ART BLOOD GAS (G3+)
ACID-BASE DEFICIT: 3 mmol/L — AB (ref 0.0–2.0)
Bicarbonate: 23.5 mEq/L (ref 20.0–24.0)
O2 SAT: 98 %
TCO2: 25 mmol/L (ref 0–100)
pCO2 arterial: 47.4 mmHg — ABNORMAL HIGH (ref 35.0–45.0)
pH, Arterial: 7.302 — ABNORMAL LOW (ref 7.350–7.450)
pO2, Arterial: 107 mmHg — ABNORMAL HIGH (ref 80.0–100.0)

## 2015-02-22 LAB — APTT: aPTT: 65 seconds — ABNORMAL HIGH (ref 24–37)

## 2015-02-22 NOTE — Progress Notes (Signed)
Patients SBP began dropping to upper 70's with MAP into the upper 50's. Started running patient even and restarted low dose of levo. Will continue to monitor. MAP goal is 60-65 at this time.

## 2015-02-22 NOTE — Progress Notes (Signed)
Subjective: Interval History: has no complaint, sedated on vent.  Objective: Vital signs in last 24 hours: Temp:  [97.7 F (36.5 C)-98.7 F (37.1 C)] 98 F (36.7 C) (03/14 0719) Pulse Rate:  [91-97] 97 (03/13 1444) Resp:  [10-26] 25 (03/14 0800) BP: (79-105)/(43-64) 96/43 mmHg (03/13 2326) SpO2:  [86 %-100 %] 100 % (03/14 0800) Arterial Line BP: (77-119)/(46-71) 92/53 mmHg (03/14 0800) FiO2 (%):  [40 %-50 %] 40 % (03/14 0810) Weight:  [104.8 kg (231 lb 0.7 oz)] 104.8 kg (231 lb 0.7 oz) (03/14 0500) Weight change: -2.7 kg (-5 lb 15.2 oz)  Intake/Output from previous day: 03/13 0701 - 03/14 0700 In: 2785.1 [I.V.:727.1; NG/GT:1500; IV Piggyback:558] Out: 3160  Intake/Output this shift: Total I/O In: -  Out: 77 [Other:77]  General appearance: moderately obese and sedated on vent Resp: rales bilaterally and rhonchi bilaterally Cardio: regular rate and rhythm, S1, S2 normal, no murmur, click, rub or gallop GI: pos bs, obese, soft,  liver down 5 cm Pelvic: not done  extrem 2-3+ edema Lab Results:  Recent Labs  02/21/15 0440 02/22/15 0422  WBC 17.7* 22.3*  HGB 14.3 14.1  HCT 45.8 43.9  PLT 134* 146*   BMET:  Recent Labs  02/21/15 0440 02/21/15 1600  NA 135 134*  K 4.2 4.9  CL 104 100  CO2 23 27  GLUCOSE 157* 189*  BUN 38* 38*  CREATININE 1.51* 1.56*  CALCIUM 8.2* 8.4   No results for input(s): PTH in the last 72 hours. Iron Studies: No results for input(s): IRON, TIBC, TRANSFERRIN, FERRITIN in the last 72 hours.  Studies/Results: Dg Chest Port 1 View  02/22/2015   CLINICAL DATA:  Acute respiratory failure  EXAM: PORTABLE CHEST - 1 VIEW  COMPARISON:  02/21/2015  FINDINGS: Endotracheal tube tip is between the clavicles and the carina. Right jugular central line extends into the low SVC. Left jugular central line extends into the SVC near the azygos vein junction. Nasogastric tube extends below the diaphragm and off the inferior edge of the image. Dense  consolidation persists in the left base with ground-glass opacity in the right perihilar region, unchanged.  IMPRESSION: Support equipment as detailed above. Persistent dense consolidation in the left base without significant interval change.   Electronically Signed   By: Ellery Plunkaniel R Mitchell M.D.   On: 02/22/2015 05:29   Dg Chest Port 1 View  02/21/2015   CLINICAL DATA:  Acute respiratory failure, hypertension, diabetes, diastolic CHF  EXAM: PORTABLE CHEST - 1 VIEW  COMPARISON:  Portable exam 0621 hr compared to 02/20/2015  FINDINGS: Tip of endotracheal tube projects 3.5 cm above carina.  Nasogastric tube extends into stomach.  BILATERAL jugular central venous catheters with tips projecting over SVC.  Enlargement of cardiac silhouette.  Rotated to the LEFT.  Persistent BILATERAL lower lobe opacities LEFT greater than RIGHT question atelectasis versus consolidation.  Remaining lungs clear.  No definite pneumothorax.  Small LEFT pleural effusion not excluded.  Atherosclerotic calcification aortic arch.  IMPRESSION: Enlargement of cardiac silhouette.  Persistent BILATERAL lower lobe opacities question atelectasis versus consolidation, greater on LEFT.   Electronically Signed   By: Ulyses SouthwardMark  Boles M.D.   On: 02/21/2015 08:28    I have reviewed the patient's current medications.  Assessment/Plan: 1 AKI ischemic ATN.  Oliguric. Good solute/acid/base/K clearance. Will cont. Will keep even with bp marginal/pressors 2 VDRF per CCM 3 Celluliteis 4 DM controlled 5 HTN not an issue 6 ^ lipids 7 Obesty  P CRRT, keep even, check  Fe    LOS: 7 days   Duncan Alejandro L 02/22/2015,8:28 AM

## 2015-02-22 NOTE — Progress Notes (Signed)
PULMONARY / CRITICAL CARE MEDICINE   Name: Bryce [name] MRN: [id number] DOB: December 18, 1945    ADMISSION DATE:  02/25/15 CONSULTATION DATE:  3/8  REFERRING MD : [facility]ad  CHIEF COMPLAINT:  AMS  INITIAL PRESENTATION: 69 y/o M w/ PMHx of HTN, HLD, DM type II, chronic dCHF, BPH, and RBBB, admitted w/ purulent LE cellulitis, and oliguric AKI (Cr >6).  PCCM consulted for acute hypoxic respiratory failure and septic shock.   STUDIES:  3/8 [place] RUQ>>>No cholelithiasis or sonographic evidence of acute cholecystitis. 2. Heterogeneous hepatic echogenicity as can be seen with hepatocellular disease. 3/9 CT lower ext>>>soft tissue edema , no drainable fluid collection.  SIGNIFICANT EVENTS: 3/8 >> Transfer to ICU, shock, acidotic, intubation, pressors 3/8 - gram neg in blood, cvvhd 3/9 - CT done legs, continued high pressors  SUBJECTIVE: afebrile On  levo gtt  mcg/min CVVH even  VITAL SIGNS: Temp:  [97.7 F (36.5 C)-98.7 F (37.1 C)] 98 F (36.7 C) (03/14 0719) Pulse Rate:  [91-97] 97 (03/13 1444) Resp:  [10-26] 21 (03/14 0900) BP: (79-105)/(43-64) 96/43 mmHg (03/13 2326) SpO2:  [86 %-100 %] 100 % (03/14 0900) Arterial Line BP: (77-119)/(46-71) 101/62 mmHg (03/14 0900) FiO2 (%):  [40 %-50 %] 40 % (03/14 0810) Weight:  [104.8 kg (231 lb 0.7 oz)] 104.8 kg (231 lb 0.7 oz) (03/14 0500)   HEMODYNAMICS: CVP:  [9 mmHg-18 mmHg] 15 mmHg   VENTILATOR SETTINGS: Vent Mode:  [-] PRVC FiO2 (%):  [40 %-50 %] 40 % Set Rate:  [24 bmp-25 bmp] 24 bmp Vt Set:  [530 mL-540 mL] 540 mL PEEP:  [5 cmH20] 5 cmH20 Pressure Support:  [10 cmH20] 10 cmH20 Plateau Pressure:  [17 cmH20-26 cmH20] 18 cmH20   INTAKE / OUTPUT:  Intake/Output Summary (Last 24 hours) at 02/22/15 0952 Last data filed at 02/22/15 0902  Gross per 24 hour  Intake 2830.55 ml  Output   2934 ml  Net -103.45 ml   PHYSICAL EXAMINATION: General:  Obese AAM, sedated Neuro:  RASS -2, not following commands, opens eyes to pain HEENT:   JVD improving Cardiovascular:  RRR distant  Lungs:  BL scattered rhonchi Abdomen:  obses +bs, no r/g Musculoskeletal:  intact Skin:  Lower ext cellulitis, wrapped  LABS:  CBC  Recent Labs Lab 02/20/15 0415 02/21/15 0440 02/22/15 0422  WBC 14.9* 17.7* 22.3*  HGB 14.3 14.3 14.1  HCT 44.4 45.8 43.9  PLT 180 134* 146*   BMET  Recent Labs Lab 02/20/15 1802 02/21/15 0440 02/21/15 1600  NA 136 135 134*  K 4.1 4.2 4.9  CL 100 104 100  CO2 BUN 40* 38* 38*  CREATININE 1.79* 1.51* 1.56*  GLUCOSE 179* 157* 189*   Electrolytes  Recent Labs Lab 02/20/15 1802 02/21/15 0440 02/21/15 1600  CALCIUM 8.4 8.2* 8.4  PHOS 2.1* 1.9* 4.3   Sepsis Markers  Recent Labs Lab 2015-02-25 2005 02/16/15 0900 02/17/15 0400 02/17/15 1000 02/18/15 0430  LATICACIDVEN 2.6* 3.4*  --  2.2*  --   PROCALCITON  --  12.09 8.06  --  8.46   ABG  Recent Labs Lab 02/18/15 0738 02/19/15 0335 02/19/15 1012  PHART 7.310* 7.333* 7.275*  PCO2ART 38.9 42.1 49.6*  PO2ART 88.0 113.0* 74.0*   Liver Enzymes  Recent Labs Lab 02-25-15 1222 02/16/15 0323  02/19/15 0400  02/20/15 1802 02/21/15 0440 02/21/15 1600  AST 57* 56*  --  173*  --   --   --   --   ALT  203* 167*  --  257*  --   --   --   --   ALKPHOS 112 100  --  105  --   --   --   --   BILITOT 6.1* 6.6*  --  8.6*  --   --   --   --   ALBUMIN 2.1* 2.0*  < > 1.6*  < > 1.6* 1.6* 1.5*  < > = values in this interval not displayed. Cardiac Enzymes  Recent Labs Lab 02/16/15 0900 02/16/15 1645 02/16/15 2145  TROPONINI 0.15* 0.15* 0.16*   Glucose  Recent Labs Lab 02/21/15 1316 02/21/15 1602 02/21/15 2025 02/22/15 02/22/15 0352 02/22/15 0834  GLUCAP 158* 165* 156* 169* 136* 214*   Imaging Dg Chest Port 1 View  02/21/2015   CLINICAL DATA:  Acute respiratory failure, hypertension, diabetes, diastolic CHF  EXAM: PORTABLE CHEST - 1 VIEW  COMPARISON:  Portable exam 0621 hr compared to 02/20/2015  FINDINGS: Tip of  endotracheal tube projects 3.5 cm above carina.  Nasogastric tube extends into stomach.  BILATERAL jugular central venous catheters with tips projecting over SVC.  Enlargement of cardiac silhouette.  Rotated to the LEFT.  Persistent BILATERAL lower lobe opacities LEFT greater than RIGHT question atelectasis versus consolidation.  Remaining lungs clear.  No definite pneumothorax.  Small LEFT pleural effusion not excluded.  Atherosclerotic calcification aortic arch.  IMPRESSION: Enlargement of cardiac silhouette.  Persistent BILATERAL lower lobe opacities question atelectasis versus consolidation, greater on LEFT.   Electronically Signed   By: Ulyses SouthwardMark  Boles M.D.   On: 02/21/2015 08:28   ASSESSMENT / PLAN:  PULMONARY OETT 3/8 >> A: Acute Hypoxic Respiratory Failure ?Pulmonary Edema improved P:   ABG now. SBTs once off pressors. Continue full vent support for now.  CARDIOVASCULAR LIJ CVL 3/8 >> RIJ Temp HD Cath 3/8 >> Right Femoral A-line 3/8 >> A: Septic Shock Chronic dCHF Severe pulm htn Echo  3/10 >> RVSP 62, dilated RV P:  Levophed gtt Added vaso & stress steroids Map goal 60-65 Off Neo  RENAL A:   Acute Renal Failure/ATN; likely 2/2 sepsis  Anuric on CRRT Hyponatremia AG Metabolic Acidosis 2/2 acute renal failure/lactic acidosis P:   CRRT per renal even balance.  GASTROINTESTINAL A:   Obesity GI PPx Protein calorie mal nutrition P:   PPI TF to goal  HEMATOLOGIC A:   DVT PPx P:  Heparin [place] CBC  INFECTIOUS A:   Purulent LE Cellulitis Sepsis/septic Shock Gram neg septic shock P:   BCx2  3/7>>morganella  UC  3/8>>ng C diff neg 3/11   Abx: 3/7 vanc>>3/9 3/7 zoysn>>3/9 3/8 Imipenem>>>  D/w Ortho, no action for now. Can change to ceftx once clinical improvement/ off pressors  ENDOCRINE A:  DM P:   SSI No evidence rel AI but will use steroids since pressor dependent  NEUROLOGIC A:   Lethargic P:   RASS goal: 0 Fent drip, WUA  FAMILY  -  Updates: Spoke with brother and two sisters, discussed tracheostomy/PEG options, they are not quite ready to make decisions, will plan to meet with the rest of the family on Wednesday or Thursday (they will get back to us) but in the meantime they were agreeable to LCB with no CPR or cardioversion.  Will meet with family again later this week.  - Inter-disciplinary family meet or Palliative Care meeting due by:  3/14  The patient is critically ill with multiple organ systems failure and requires high complexity decision making for  assessment and support, frequent evaluation and titration of therapies, application of advanced monitoring technologies and extensive interpretation of multiple databases.   Critical Care Time devoted to patient care services described in this note is  35  Minutes. This time reflects time of care of this signee Dr Koren Bound. This critical care time does not reflect procedure time, or teaching time or supervisory time of PA/NP/Med student/Med Resident etc but could involve care discussion time.  Alyson Reedy, M.D. [facility] Pulmonary/Critical Care Medicine. Pager: [phone]. After hours pager: [phone].

## 2015-02-23 ENCOUNTER — Inpatient Hospital Stay (HOSPITAL_COMMUNITY): Payer: Commercial Managed Care - HMO

## 2015-02-23 DIAGNOSIS — E1159 Type 2 diabetes mellitus with other circulatory complications: Secondary | ICD-10-CM

## 2015-02-23 DIAGNOSIS — J9600 Acute respiratory failure, unspecified whether with hypoxia or hypercapnia: Secondary | ICD-10-CM

## 2015-02-23 DIAGNOSIS — I5032 Chronic diastolic (congestive) heart failure: Secondary | ICD-10-CM

## 2015-02-23 LAB — COMPREHENSIVE METABOLIC PANEL
ALT: 75 U/L — AB (ref 0–53)
ANION GAP: 10 (ref 5–15)
AST: 60 U/L — AB (ref 0–37)
Albumin: 1.7 g/dL — ABNORMAL LOW (ref 3.5–5.2)
Alkaline Phosphatase: 204 U/L — ABNORMAL HIGH (ref 39–117)
BUN: 43 mg/dL — ABNORMAL HIGH (ref 6–23)
CO2: 24 mmol/L (ref 19–32)
CREATININE: 1.53 mg/dL — AB (ref 0.50–1.35)
Calcium: 8.5 mg/dL (ref 8.4–10.5)
Chloride: 101 mmol/L (ref 96–112)
GFR calc Af Amer: 53 mL/min — ABNORMAL LOW (ref >90)
GFR calc non Af Amer: 45 mL/min — ABNORMAL LOW (ref >90)
Glucose, Bld: 219 mg/dL — ABNORMAL HIGH (ref 70–99)
Potassium: 4.6 mmol/L (ref 3.5–5.1)
SODIUM: 135 mmol/L (ref 135–145)
Total Bilirubin: 6.9 mg/dL — ABNORMAL HIGH (ref 0.3–1.2)
Total Protein: 7.3 g/dL (ref 6.0–8.3)

## 2015-02-23 LAB — BLOOD GAS, ARTERIAL
ACID-BASE EXCESS: 0.1 mmol/L (ref 0.0–2.0)
Bicarbonate: 24.7 mEq/L — ABNORMAL HIGH (ref 20.0–24.0)
Drawn by: 36277
FIO2: 0.4 %
LHR: 25 {breaths}/min
MECHVT: 540 mL
O2 Saturation: 99.3 %
PCO2 ART: 43.7 mmHg (ref 35.0–45.0)
PEEP: 5 cmH2O
Patient temperature: 98.6
TCO2: 26.1 mmol/L (ref 0–100)
pH, Arterial: 7.372 (ref 7.350–7.450)
pO2, Arterial: 134 mmHg — ABNORMAL HIGH (ref 80.0–100.0)

## 2015-02-23 LAB — RENAL FUNCTION PANEL
ALBUMIN: 1.7 g/dL — AB (ref 3.5–5.2)
Anion gap: 10 (ref 5–15)
BUN: 42 mg/dL — ABNORMAL HIGH (ref 6–23)
CALCIUM: 8.4 mg/dL (ref 8.4–10.5)
CHLORIDE: 103 mmol/L (ref 96–112)
CO2: 22 mmol/L (ref 19–32)
CREATININE: 1.44 mg/dL — AB (ref 0.50–1.35)
GFR calc non Af Amer: 49 mL/min — ABNORMAL LOW (ref >90)
GFR, EST AFRICAN AMERICAN: 57 mL/min — AB (ref >90)
Glucose, Bld: 227 mg/dL — ABNORMAL HIGH (ref 70–99)
Phosphorus: 3.8 mg/dL (ref 2.3–4.6)
Potassium: 4.4 mmol/L (ref 3.5–5.1)
SODIUM: 135 mmol/L (ref 135–145)

## 2015-02-23 LAB — GLUCOSE, CAPILLARY
Glucose-Capillary: 187 mg/dL — ABNORMAL HIGH (ref 70–99)
Glucose-Capillary: 163 mg/dL — ABNORMAL HIGH (ref 70–99)
Glucose-Capillary: 156 mg/dL — ABNORMAL HIGH (ref 70–99)
Glucose-Capillary: 160 mg/dL — ABNORMAL HIGH (ref 70–99)
Glucose-Capillary: 171 mg/dL — ABNORMAL HIGH (ref 70–99)

## 2015-02-23 LAB — POCT ACTIVATED CLOTTING TIME
ACTIVATED CLOTTING TIME: 202 s
ACTIVATED CLOTTING TIME: 196 s
ACTIVATED CLOTTING TIME: 196 s
Activated Clotting Time: 214 seconds

## 2015-02-23 LAB — MAGNESIUM: Magnesium: 2.3 mg/dL (ref 1.5–2.5)

## 2015-02-23 LAB — PHOSPHORUS: Phosphorus: 2.1 mg/dL — ABNORMAL LOW (ref 2.3–4.6)

## 2015-02-23 LAB — CBC
HCT: 44.4 % (ref 39.0–52.0)
Hemoglobin: 14 g/dL (ref 13.0–17.0)
MCH: 28.6 pg (ref 26.0–34.0)
MCHC: 31.5 g/dL (ref 30.0–36.0)
MCV: 90.8 fL (ref 78.0–100.0)
Platelets: 124 10*3/uL — ABNORMAL LOW (ref 150–400)
RBC: 4.89 MIL/uL (ref 4.22–5.81)
RDW: 17.9 % — AB (ref 11.5–15.5)
WBC: 24.2 10*3/uL — AB (ref 4.0–10.5)

## 2015-02-23 LAB — APTT: aPTT: 48 seconds — ABNORMAL HIGH (ref 24–37)

## 2015-02-23 MED ORDER — SODIUM PHOSPHATE 3 MMOLE/ML IV SOLN
20.0000 mmol | Freq: Once | INTRAVENOUS | Status: AC
  Administered 2015-02-23: 20 mmol via INTRAVENOUS
  Filled 2015-02-23: qty 6.67

## 2015-02-23 MED ORDER — CHLORHEXIDINE GLUCONATE CLOTH 2 % EX PADS
6.0000 | MEDICATED_PAD | Freq: Every day | CUTANEOUS | Status: AC
  Administered 2015-02-23 – 2015-02-27 (×5): 6 via TOPICAL

## 2015-02-23 MED ORDER — SODIUM CHLORIDE 0.9 % IJ SOLN
10.0000 mL | Freq: Two times a day (BID) | INTRAMUSCULAR | Status: DC
  Administered 2015-02-23 – 2015-02-26 (×7): 10 mL
  Administered 2015-02-27 – 2015-02-28 (×2): 20 mL
  Administered 2015-02-28: 10 mL
  Administered 2015-03-01: 20 mL

## 2015-02-23 MED ORDER — SODIUM CHLORIDE 0.9 % IJ SOLN: 10.0000 mL | INTRAMUSCULAR | Status: DC | PRN

## 2015-02-23 MED ORDER — MUPIROCIN 2 % EX OINT
1.0000 "application " | TOPICAL_OINTMENT | Freq: Two times a day (BID) | CUTANEOUS | Status: AC
  Administered 2015-02-23 – 2015-02-27 (×10): 1 via NASAL
  Filled 2015-02-23: qty 22

## 2015-02-23 NOTE — Progress Notes (Signed)
ANTIBIOTIC CONSULT NOTE - FOLLOW UP  Pharmacy Consult for Primaxin Indication: morganella bacteremia/UTI   Allergies  Allergen Reactions  . Penicillins Other (See Comments)    "I fall out"    Patient Measurements: Height: 5\' 7"  (170.2 cm) Weight: 232 lb 9.4 oz (105.5 kg) IBW/kg (Calculated) : 66.1  Vital Signs: Temp: 97.8 F (36.6 C) (03/15 0400) Temp Source: Oral (03/15 0400) BP: 103/63 mmHg (03/15 0314) Pulse Rate: 77 (03/15 0314) Intake/Output from previous day: 03/14 0701 - 03/15 0700 In: 2489.4 [I.V.:804.4; NG/GT:1325; IV Piggyback:300] Out: 2474 [Stool:70] Intake/Output from this shift: Total I/O In: 1140 [I.V.:405; NG/GT:635; IV Piggyback:100] Out: 1095 [Other:1095]  Labs:  Recent Labs  02/21/15 0440  02/22/15 0422 02/22/15 0838 02/22/15 1530 02/23/15 0400  WBC 17.7*  --  22.3*  --   --  24.2*  HGB 14.3  --  14.1  --   --  14.0  PLT 134*  --  146*  --   --  124*  CREATININE 1.51*  < >  --  1.50* 1.49* 1.53*  < > = values in this interval not displayed. Estimated Creatinine Clearance: 54.3 mL/min (by C-G formula based on Cr of 1.53).  Assessment: 68 YOM started on vancomycin and zosyn for bilateral lower extremity cellulitis and septic shock. Grew morganella in 1 of 2 blood culture on 03/04/2015. Antibiotics were switched to Primaxin. CT of legs performed to rule out necrotizing fascitis. 02/17/15 CT of right and left legs revealed soft tissue edema, may be reactive versus secondary to cellulitis, no drainable fluid collection. Patient now afebrile, WBC elevated trending up to 24.2 today. Per CCM notes, will de-escalate antibitotics once clinically improved and off vasopressors. He remains on Levophed IV at 364mcg/min.  3/7 Vanc>>3/10 3/7 Zosyn>>3/9 3/9 Primaxin>>  3/7 blood culture>>1/2 GNR>morganella (pansensitive  except R-amp & cefazolin) 3/7 MRSA PCR>>positive 3/8 UCx>>cancelled 3/10 BCx>>NGTD 3/11 CDiff>>negative  Patient in acute kidney failure due to  volume depletion, dehydration, and sepsis, which progressed to ischemic acute tubular necrosis and has been on CVVHD for 5 days (started 02/16/15). Patient's renal function has not improved today will watch renal function for any dosing adjustments. Primaxin dose remains appropriate for CVVHD.  Goal of Therapy:  Resolution of infection  Plan:  Continue Primaxin 500mg  IV Q8h Follow renal plans, C&S, LOT De-escalate abx upon clinical improvement  Thomasene Rippleommi Dellis Voght, PharmD Candidate

## 2015-02-23 NOTE — Progress Notes (Signed)
NUTRITION FOLLOW-UP  DOCUMENTATION CODES Per approved criteria  -Morbid Obesity   INTERVENTION: Continue Vital High Protein @ goal rate of 45 ml/hr via OG tube    60 ml Prostat TID.    Tube feeding regimen provides 1680 kcal (14 kcal/kg of ABW), 184 grams of protein, and 902 ml of H2O.   NUTRITION DIAGNOSIS: Increased nutrient needs related to wounds as evidenced by estimated needs; ongoing  Goal: Enteral nutrition to provide 60-70% of estimated calorie needs (22-25 kcals/kg ideal body weight) and 100% of estimated protein needs, based on ASPEN guidelines for hypocaloric, high protein feeding in critically ill obese individuals; met  Monitor:  Vent status, CRRT, TF tolerance/adequacy, weight trends, labs  Reason for Assessment: Ventilator   69 y.o. male  Admitting Dx: Sepsis  ASSESSMENT: Pt with hx of HTN, CHF, and DM who has significant lower extremity cellulitis and skin maceration of buttock and perirectal area followed by [facility] wound clinic. Pt admitted after he fell at home with acute renal failure, pt lives alone and with purulent drainage from wounds.  Pt became hypoxic with hypotension and transferred to ICU and intubated.   Patient is currently intubated on ventilator support MV: 13.9 L/min Temp (24hrs), Avg:98.1 F (36.7 C), Min:97.8 F (36.6 C), Max:98.8 F (37.1 C)  Tested for C. Diff and was negative. Still receiving senna. Talked with nurse, and she reported that stools have been less frequent and more manageable. Labs reviewed: Elevated BUN/Cr, glucose Low phosphorous     Height: Ht Readings from Last 1 Encounters:  02/20/2015 5' 7"  (1.702 m)    Weight: Wt Readings from Last 1 Encounters:  02/23/15 232 lb 9.4 oz (105.5 kg)    BMI:  Body mass index is 36.42 kg/(m^2).  Estimated Nutritional Needs: Kcal: 0962-8366 Protein: 168 grams Fluid: > 1.2 L/day  Skin:  Maceration of buttocks Stage II left leg with loose skin flap Stage II right leg with  cellulitis  Diet Order: Diet NPO time specified   Intake/Output Summary (Last 24 hours) at 02/23/15 0848 Last data filed at 02/23/15 0800  Gross per 24 hour  Intake 2579.18 ml  Output   2520 ml  Net  59.18 ml    Last BM: 3/15, loose and pale brown from rectal tube  Labs:   Recent Labs Lab 02/22/15 0838 02/22/15 1530 02/23/15 0400  NA 134* 135 135  K 5.4* 4.5 4.6  CL 102 104 101  CO2 24 24 24   BUN 41* 42* 43*  CREATININE 1.50* 1.49* 1.53*  CALCIUM 8.5 8.4 8.5  MG  --   --  2.3  PHOS 2.7 2.1* 2.1*  GLUCOSE 226* 203* 219*    CBG (last 3)   Recent Labs  02/22/15 2339 02/23/15 0355 02/23/15 0829  GLUCAP 187* 163* 156*    Scheduled Meds: . antiseptic oral rinse  7 mL Mouth Rinse QID  . aspirin EC  81 mg Oral q morning - 10a  . chlorhexidine  15 mL Mouth Rinse BID  . Chlorhexidine Gluconate Cloth  6 each Topical Q0600  . feeding supplement (PRO-STAT SUGAR FREE 64)  60 mL Per Tube TID  . feeding supplement (VITAL HIGH PROTEIN)  1,000 mL Per Tube Q24H  . folic acid  1 mg Oral q morning - 10a  . hydrocortisone sod succinate (SOLU-CORTEF) inj  50 mg Intravenous Q6H  . imipenem-cilastatin  500 mg Intravenous 3 times per day  . insulin aspart  0-15 Units Subcutaneous 6 times per day  .  mupirocin ointment  1 application Nasal BID  . pantoprazole sodium  40 mg Per Tube Q1200  . senna  1 tablet Oral BID  . sodium chloride  10-40 mL Intracatheter Q12H  . thiamine  100 mg Oral Daily    Continuous Infusions: . sodium chloride 10 mL/hr at 02/23/15 0700  . fentaNYL infusion INTRAVENOUS 300 mcg/hr (02/23/15 0747)  . heparin 10,000 units/ 20 mL infusion syringe 1,500 Units/hr (02/23/15 0800)  . norepinephrine (LEVOPHED) Adult infusion 2 mcg/min (02/23/15 0747)  . phenylephrine (NEO-SYNEPHRINE) Adult infusion Stopped (02/16/15 2055)  . dialysis replacement fluid (prismasate) 400 mL/hr at 02/23/15 0110  . dialysis replacement fluid (prismasate) 200 mL/hr at 02/23/15 0203   . dialysate (PRISMASATE) 1,500 mL/hr at 02/23/15 0834  . vasopressin (PITRESSIN) infusion - *FOR SHOCK*      Wynona Dove, MS Dietetic Intern Pager: [phone]

## 2015-02-23 NOTE — Consult Note (Signed)
WOC wound follow up Wound type: venous stasis ulcers bilateral. L >R   Wound ZOX:WRUEbed:Left is 15cm x circumferential partial thickness skin loss was large ruptured bulla.  today noted new area with bulla on the left malleolus that has thick, sanguinous drainage with some odor.  I was able to drain this easily with pressure. Right lateral: 11cm x 10cm x 0.2cm, mostly clean, less fibrin today but increased drainage with compression. Edema much improved.  Drainage (amount, consistency, odor) heavy, thick sanguinous drainage from the bilateral LEs, odor noted on the wounds on the right. Periwound: intact  Dressing procedure/placement/frequency: Silver hydrofiber for topical therapy on the right and left wounds due to the amount of exudate and for the antimicrobial coverage. Continue 4 layer compression therapy 2x wk. As ordered by orthopedics.   WOC team will follow along with you for weekly wound assessments and 2x wk Profore compression wrap changes. Tues/Fridays. Please notify me of any acute changes in the wounds or any new areas of concerns Armen PickupMelody Tajha Sammarco RN,CWOCN [phone]

## 2015-02-23 NOTE — Progress Notes (Signed)
Patient ID: Bryce [name], male   DOB: 05/19/1946, 69 y.o.   MRN: [id number]  [facility] Progress Note    Assessment/ Plan:   1. Acute renal failure: Baseline creatinine normal but last known was in June of 2015. Appears to be hemodynamically mediated with evolution to ischemic ATN- he continues to remain anuric and on low-dose pressors. Running even. When off pressors, will consider transitioning to intermittent dialysis as we await renal recovery. Right now- elytes and acid base stable- no change to all dialysate. He is a very marginal patient for chronic hemodialysis therapy with his limited baseline functional status. 2. Sepsis: Blood cultures positive with morganella-, urine cultures negative and C. difficile testing negative. He remains on broad-spectrum antibiotic  therapy with Primaxin for his purulent lower extremity cellulitis and lower extremity ulcers. 3. Hyponatremia: Secondary to free water excretion defect of acute renal failure, corrected with CRRT. 4. Lower extremity wounds: Wound care as directed by Dr.Duda-Profore dressings with twice a week changes. 5. Anemia- interestingly not an issue 6. Hypophos- will replete again today   Subjective:   Events from overnight noted- no significant change- not really waking up mentally- CRRT running well- still on pressors   Objective:   BP 122/73 mmHg  Pulse 82  Temp(Src) 97.8 F (36.6 C) (Oral)  Resp 18  Ht 5\' 7"  (1.702 m)  Wt 105.5 kg (232 lb 9.4 oz)  BMI 36.42 kg/m2  SpO2 100%  Intake/Output Summary (Last 24 hours) at 02/23/15 [id number] Last data filed at 02/23/15 0900  Gross per 24 hour  Intake 2588.28 ml  Output   2530 ml  Net  58.28 ml   Weight change: 0.7 kg (1 lb 8.7 oz)  Physical Exam: GEX:[id number]:Intubated, mildly sedated--awakens to voice and nods to questions CVS: Pulse regular in rate and rhythm-distant S1-S2 Resp: Coarse breath sounds-no distinct rales Abd: Soft, obese, nontender Ext: 1-2+ upper and  lower extremity edema, Profore dressing bilateral lower extremities  Imaging: Dg Chest Port 1 View  02/23/2015   CLINICAL DATA:  Ventilator.  Short of breath  EXAM: PORTABLE CHEST - 1 VIEW  COMPARISON:  02/22/2015  FINDINGS: Endotracheal tube in good position. Central venous catheter tip in the left innominate vein near the SVC. No pneumothorax. Right jugular catheter tip at the cavoatrial junction.  Left lower lobe consolidation unchanged. Mild right lower lobe consolidation also unchanged. No significant edema or effusion.  IMPRESSION: Support lines in unchanged position.  Bibasilar consolidation left greater than right unchanged.   Electronically Signed   By: Marlan Palauharles  Clark M.D.   On: 02/23/2015 07:35   Dg Chest Port 1 View  02/22/2015   CLINICAL DATA:  Acute respiratory failure  EXAM: PORTABLE CHEST - 1 VIEW  COMPARISON:  02/21/2015  FINDINGS: Endotracheal tube tip is between the clavicles and the carina. Right jugular central line extends into the low SVC. Left jugular central line extends into the SVC near the azygos vein junction. Nasogastric tube extends below the diaphragm and off the inferior edge of the image. Dense consolidation persists in the left base with ground-glass opacity in the right perihilar region, unchanged.  IMPRESSION: Support equipment as detailed above. Persistent dense consolidation in the left base without significant interval change.   Electronically Signed   By: Ellery Plunkaniel R Mitchell M.D.   On: 02/22/2015 05:29    Labs: BMET  Recent Labs Lab 02/19/15 1630 02/20/15 0415 02/20/15 1802 02/21/15 0440 02/21/15 1600 02/22/15 0838 02/22/15 1530 02/23/15 0400  NA 137 135  136 135 134* 134* 135 135  K 3.9 3.9 4.1 4.2 4.9 5.4* 4.5 4.6  CL 103 103 100 104 100 102 104 101  CO2 GLUCOSE 150* 157* 179* 157* 189* 226* 203* 219*  BUN 39* 40* 40* 38* 38* 41* 42* 43*  CREATININE 1.88* 1.75* 1.79* 1.51* 1.56* 1.50* 1.49* 1.53*  CALCIUM 8.3* 8.5 8.4 8.2*  8.4 8.5 8.4 8.5  PHOS 3.1  --  2.1* 1.9* 4.3 2.7 2.1* 2.1*   CBC  Recent Labs Lab 02/17/15 0948 02/19/15 0400 02/20/15 0415 02/21/15 0440 02/22/15 0422 02/23/15 0400  WBC 9.6 15.4* 14.9* 17.7* 22.3* 24.2*  NEUTROABS 8.5* 13.3*  --   --   --   --   HGB 9.7* 15.0 14.3 14.3 14.1 14.0  HCT 29.3* 44.6 44.4 45.8 43.9 44.4  MCV 88.5 87.8 89.3 91.8 91.6 90.8  PLT 163 213 180 134* 146* 124*    Medications:    . antiseptic oral rinse  7 mL Mouth Rinse QID  . aspirin EC  81 mg Oral q morning - 10a  . chlorhexidine  15 mL Mouth Rinse BID  . Chlorhexidine Gluconate Cloth  6 each Topical Q0600  . feeding supplement (PRO-STAT SUGAR FREE 64)  60 mL Per Tube TID  . feeding supplement (VITAL HIGH PROTEIN)  1,000 mL Per Tube Q24H  . folic acid  1 mg Oral q morning - 10a  . hydrocortisone sod succinate (SOLU-CORTEF) inj  50 mg Intravenous Q6H  . imipenem-cilastatin  500 mg Intravenous 3 times per day  . insulin aspart  0-15 Units Subcutaneous 6 times per day  . mupirocin ointment  1 application Nasal BID  . pantoprazole sodium  40 mg Per Tube Q1200  . senna  1 tablet Oral BID  . sodium chloride  10-40 mL Intracatheter Q12H  . thiamine  100 mg Oral Daily   [name]   02/23/2015, 9:21 AM

## 2015-02-23 NOTE — Progress Notes (Signed)
PULMONARY / CRITICAL CARE MEDICINE   Name: Bryce [name] MRN: [id number] DOB: 03/10/1946    ADMISSION DATE:  03/02/2015 CONSULTATION DATE:  3/8  REFERRING MD : Triad  CHIEF COMPLAINT:  AMS  INITIAL PRESENTATION: 69 y/o M w/ PMHx of HTN, HLD, DM type II, chronic dCHF, BPH, and RBBB, admitted w/ purulent LE cellulitis, and oliguric AKI (Cr >6).  PCCM consulted for acute hypoxic respiratory failure and septic shock.   STUDIES:  3/8 [place] RUQ>>>No cholelithiasis or sonographic evidence of acute cholecystitis. 2. Heterogeneous hepatic echogenicity as can be seen with hepatocellular disease. 3/9 CT lower ext>>>soft tissue edema , no drainable fluid collection.  SIGNIFICANT EVENTS: 3/8 >> Transfer to ICU, shock, acidotic, intubation, pressors 3/8 - gram neg in blood, cvvhd 3/9 - CT done legs, continued high pressors  SUBJECTIVE: afebrile On  levo gtt @4  mcg/min CVVH even  VITAL SIGNS: Temp:  [97.8 F (36.6 C)-98.8 F (37.1 C)] 97.8 F (36.6 C) (03/15 0739) Pulse Rate:  [70-89] 82 (03/15 0745) Resp:  [10-25] 10 (03/15 1000) BP: (95-122)/(57-73) 122/73 mmHg (03/15 0745) SpO2:  [97 %-100 %] 100 % (03/15 0900) Arterial Line BP: (84-138)/(50-86) 122/74 mmHg (03/15 1000) FiO2 (%):  [40 %] 40 % (03/15 0745) Weight:  [105.5 kg (232 lb 9.4 oz)] 105.5 kg (232 lb 9.4 oz) (03/15 0500)   HEMODYNAMICS: CVP:  [15 mmHg-16 mmHg] 15 mmHg   VENTILATOR SETTINGS: Vent Mode:  [-] PRVC FiO2 (%):  [40 %] 40 % Set Rate:  [24 bmp-25 bmp] 25 bmp Vt Set:  [540 mL] 540 mL PEEP:  [5 cmH20] 5 cmH20 Plateau Pressure:  [14 cmH20-23 cmH20] 23 cmH20   INTAKE / OUTPUT:  Intake/Output Summary (Last 24 hours) at 02/23/15 1029 Last data filed at 02/23/15 1011  Gross per 24 hour  Intake 2698.84 ml  Output   2547 ml  Net 151.84 ml   PHYSICAL EXAMINATION: General:  Obese AAM, sedated Neuro:  RASS -2, not following commands, opens eyes to pain HEENT:  JVD improving Cardiovascular:  RRR distant  Lungs:   BL scattered rhonchi Abdomen:  obses +bs, no r/g Musculoskeletal:  intact Skin:  Lower ext cellulitis, wrapped  LABS:  CBC  Recent Labs Lab 02/21/15 0440 02/22/15 0422 02/23/15 0400  WBC 17.7* 22.3* 24.2*  HGB 14.3 14.1 14.0  HCT 45.8 43.9 44.4  PLT 134* 146* 124*   BMET  Recent Labs Lab 02/22/15 0838 02/22/15 1530 02/23/15 0400  NA 134* 135 135  K 5.4* 4.5 4.6  CL 102 104 101  CO2 24 24 24   BUN 41* 42* 43*  CREATININE 1.50* 1.49* 1.53*  GLUCOSE 226* 203* 219*   Electrolytes  Recent Labs Lab 02/22/15 0838 02/22/15 1530 02/23/15 0400  CALCIUM 8.5 8.4 8.5  MG  --   --  2.3  PHOS 2.7 2.1* 2.1*   Sepsis Markers  Recent Labs Lab 02/17/15 0400 02/17/15 1000 02/18/15 0430  LATICACIDVEN  --  2.2*  --   PROCALCITON 8.06  --  8.46   ABG  Recent Labs Lab 02/19/15 1012 02/22/15 1108 02/23/15 0330  PHART 7.275* 7.302* 7.372  PCO2ART 49.6* 47.4* 43.7  PO2ART 74.0* 107.0* 134.0*   Liver Enzymes  Recent Labs Lab 02/19/15 0400  02/22/15 0838 02/22/15 1530 02/23/15 0400  AST 173*  --   --   --  60*  ALT 257*  --   --   --  75*  ALKPHOS 105  --   --   --  204*  BILITOT 8.6*  --   --   --  6.9*  ALBUMIN 1.6*  < > 1.6* 1.5* 1.7*  < > = values in this interval not displayed. Cardiac Enzymes  Recent Labs Lab 02/16/15 1645 02/16/15 2145  TROPONINI 0.15* 0.16*   Glucose  Recent Labs Lab 02/22/15 1158 02/22/15 1521 02/22/15 2012 02/22/15 2339 02/23/15 0355 02/23/15 0829  GLUCAP 155* 173* 186* 187* 163* 156*   Imaging Dg Chest Port 1 View  02/22/2015   CLINICAL DATA:  Acute respiratory failure  EXAM: PORTABLE CHEST - 1 VIEW  COMPARISON:  02/21/2015  FINDINGS: Endotracheal tube tip is between the clavicles and the carina. Right jugular central line extends into the low SVC. Left jugular central line extends into the SVC near the azygos vein junction. Nasogastric tube extends below the diaphragm and off the inferior edge of the image. Dense  consolidation persists in the left base with ground-glass opacity in the right perihilar region, unchanged.  IMPRESSION: Support equipment as detailed above. Persistent dense consolidation in the left base without significant interval change.   Electronically Signed   By: Ellery Plunk M.D.   On: 02/22/2015 05:29   ASSESSMENT / PLAN:  PULMONARY OETT 3/8 >> A: Acute Hypoxic Respiratory Failure ?Pulmonary Edema improved P:   ABG now. SBTs once off pressors. Begin PS trials but no extubation given mental status.  CARDIOVASCULAR LIJ CVL 3/8 >> RIJ Temp HD Cath 3/8 >> Right Femoral A-line 3/8 >> A: Septic Shock Chronic dCHF Severe pulm htn Echo  3/10 >> RVSP 62, dilated RV P:  Levophed gtt down to 2 mcg. Added vaso & stress steroids Map goal 60-65. Off Neo.  RENAL A:   Acute Renal Failure/ATN; likely 2/2 sepsis  Anuric on CRRT Hyponatremia AG Metabolic Acidosis 2/2 acute renal failure/lactic acidosis P:   CRRT per renal even balance. BMET in AM. Replace electrolytes as indicated.  GASTROINTESTINAL A:   Obesity GI PPx Protein calorie mal nutrition P:   PPI TF to goal  HEMATOLOGIC A:   DVT PPx P:  Heparin [place] CBC  INFECTIOUS A:   Purulent LE Cellulitis Sepsis/septic Shock Gram neg septic shock P:   BCx2  3/7>>morganella  UC  3/8>>ng C diff neg 3/11   Abx: 3/7 vanc>>3/9 3/7 zoysn>>3/9 3/8 Imipenem>>>  D/w Ortho, no action for now.  ENDOCRINE A:  DM P:   SSI No evidence rel AI but will use steroids since pressor dependent  NEUROLOGIC A:   Lethargic P:   RASS goal: 0 Fent drip, WUA  FAMILY  - Updates: Spoke with brother and two sisters, discussed tracheostomy/PEG options, they are not quite ready to make decisions, will plan to meet with the rest of the family on Wednesday (they will get back to [place]) but in the meantime they were agreeable to LCB with no CPR or cardioversion.  Will meet with family again later this week.  The patient  is critically ill with multiple organ systems failure and requires high complexity decision making for assessment and support, frequent evaluation and titration of therapies, application of advanced monitoring technologies and extensive interpretation of multiple databases.   Critical Care Time devoted to patient care services described in this note is  35  Minutes. This time reflects time of care of this signee Dr Koren Bound. This critical care time does not reflect procedure time, or teaching time or supervisory time of PA/NP/Med student/Med Resident etc but could involve care discussion time.  Bryce Pascua G.  Nelda Marseille, M.D. [facility] Pulmonary/Critical Care Medicine. Pager: [phone]. After hours pager: [phone].

## 2015-02-23 NOTE — Progress Notes (Signed)
Patient's daughter and sister called and updated. Family meeting planned for 10:00-10:30 Wednesday morning.

## 2015-02-24 ENCOUNTER — Inpatient Hospital Stay (HOSPITAL_COMMUNITY): Payer: Commercial Managed Care - HMO

## 2015-02-24 LAB — BASIC METABOLIC PANEL
Anion gap: 6 (ref 5–15)
BUN: 47 mg/dL — AB (ref 6–23)
CALCIUM: 8.5 mg/dL (ref 8.4–10.5)
CO2: 25 mmol/L (ref 19–32)
Chloride: 102 mmol/L (ref 96–112)
Creatinine, Ser: 1.47 mg/dL — ABNORMAL HIGH (ref 0.50–1.35)
GFR, EST AFRICAN AMERICAN: 55 mL/min — AB (ref >90)
GFR, EST NON AFRICAN AMERICAN: 48 mL/min — AB (ref >90)
GLUCOSE: 217 mg/dL — AB (ref 70–99)
POTASSIUM: 4.4 mmol/L (ref 3.5–5.1)
Sodium: 133 mmol/L — ABNORMAL LOW (ref 135–145)

## 2015-02-24 LAB — POCT ACTIVATED CLOTTING TIME
ACTIVATED CLOTTING TIME: 171 s
ACTIVATED CLOTTING TIME: 171 s
ACTIVATED CLOTTING TIME: 171 s
ACTIVATED CLOTTING TIME: 190 s
ACTIVATED CLOTTING TIME: 190 s
ACTIVATED CLOTTING TIME: 196 s
ACTIVATED CLOTTING TIME: 196 s
ACTIVATED CLOTTING TIME: 208 s
ACTIVATED CLOTTING TIME: 214 s
Activated Clotting Time: 190 seconds
Activated Clotting Time: 214 seconds
Activated Clotting Time: 184 seconds
Activated Clotting Time: 190 seconds
Activated Clotting Time: 190 seconds

## 2015-02-24 LAB — CBC
HCT: 42.9 % (ref 39.0–52.0)
Hemoglobin: 13.6 g/dL (ref 13.0–17.0)
MCH: 28.7 pg (ref 26.0–34.0)
MCHC: 31.7 g/dL (ref 30.0–36.0)
MCV: 90.5 fL (ref 78.0–100.0)
PLATELETS: 144 10*3/uL — AB (ref 150–400)
RBC: 4.74 MIL/uL (ref 4.22–5.81)
RDW: 18.4 % — ABNORMAL HIGH (ref 11.5–15.5)
WBC: 20.2 10*3/uL — ABNORMAL HIGH (ref 4.0–10.5)

## 2015-02-24 LAB — BLOOD GAS, ARTERIAL
Acid-base deficit: 0.7 mmol/L (ref 0.0–2.0)
BICARBONATE: 24 meq/L (ref 20.0–24.0)
Drawn by: 43098
FIO2: 0.4 %
MECHVT: 540 mL
O2 Saturation: 98.5 %
PEEP: 5 cmH2O
PH ART: 7.358 (ref 7.350–7.450)
PO2 ART: 125 mmHg — AB (ref 80.0–100.0)
Patient temperature: 98.6
RATE: 25 resp/min
TCO2: 25.4 mmol/L (ref 0–100)
pCO2 arterial: 43.8 mmHg (ref 35.0–45.0)

## 2015-02-24 LAB — CULTURE, BLOOD (ROUTINE X 2)
Culture: NO GROWTH
Culture: NO GROWTH

## 2015-02-24 LAB — GLUCOSE, CAPILLARY
GLUCOSE-CAPILLARY: 165 mg/dL — AB (ref 70–99)
GLUCOSE-CAPILLARY: 181 mg/dL — AB (ref 70–99)
GLUCOSE-CAPILLARY: 192 mg/dL — AB (ref 70–99)
GLUCOSE-CAPILLARY: 212 mg/dL — AB (ref 70–99)
Glucose-Capillary: 195 mg/dL — ABNORMAL HIGH (ref 70–99)
Glucose-Capillary: 182 mg/dL — ABNORMAL HIGH (ref 70–99)
Glucose-Capillary: 192 mg/dL — ABNORMAL HIGH (ref 70–99)

## 2015-02-24 LAB — RENAL FUNCTION PANEL
ALBUMIN: 1.7 g/dL — AB (ref 3.5–5.2)
ANION GAP: 9 (ref 5–15)
BUN: 45 mg/dL — AB (ref 6–23)
CO2: 23 mmol/L (ref 19–32)
CREATININE: 1.46 mg/dL — AB (ref 0.50–1.35)
Calcium: 8.4 mg/dL (ref 8.4–10.5)
Chloride: 102 mmol/L (ref 96–112)
GFR calc Af Amer: 56 mL/min — ABNORMAL LOW (ref >90)
GFR calc non Af Amer: 48 mL/min — ABNORMAL LOW (ref >90)
Glucose, Bld: 241 mg/dL — ABNORMAL HIGH (ref 70–99)
PHOSPHORUS: 2.2 mg/dL — AB (ref 2.3–4.6)
POTASSIUM: 4.5 mmol/L (ref 3.5–5.1)
Sodium: 134 mmol/L — ABNORMAL LOW (ref 135–145)

## 2015-02-24 LAB — MAGNESIUM: Magnesium: 2.3 mg/dL (ref 1.5–2.5)

## 2015-02-24 LAB — APTT: APTT: 55 s — AB (ref 24–37)

## 2015-02-24 LAB — PHOSPHORUS: PHOSPHORUS: 2.7 mg/dL (ref 2.3–4.6)

## 2015-02-24 MED ORDER — CEFTRIAXONE SODIUM IN DEXTROSE 40 MG/ML IV SOLN
2.0000 g | INTRAVENOUS | Status: DC
  Administered 2015-02-24 – 2015-02-28 (×5): 2 g via INTRAVENOUS
  Filled 2015-02-24 (×6): qty 50

## 2015-02-24 NOTE — Progress Notes (Signed)
PULMONARY / CRITICAL CARE MEDICINE   Name: Bryce [name] MRN: [id number] DOB: July 24, 1946    ADMISSION DATE:  03-08-15 CONSULTATION DATE:  3/8  REFERRING MD : Triad  CHIEF COMPLAINT:  AMS  INITIAL PRESENTATION: 69 y/o M w/ PMHx of HTN, HLD, DM type II, chronic dCHF, BPH, and RBBB, admitted w/ purulent LE cellulitis, and oliguric AKI (Cr >6).  PCCM consulted for acute hypoxic respiratory failure and septic shock.   STUDIES:  3/8 [place] RUQ>>>No cholelithiasis or sonographic evidence of acute cholecystitis. 2. Heterogeneous hepatic echogenicity as can be seen with hepatocellular disease. 3/9 CT lower ext>>>soft tissue edema , no drainable fluid collection.  SIGNIFICANT EVENTS: 3/8 >> Transfer to ICU, shock, acidotic, intubation, pressors 3/8 - gram neg in blood, cvvhd 3/9 - CT done legs, continued high pressors  SUBJECTIVE: off pressors, opens eyes but not following commands  VITAL SIGNS: Temp:  [97.7 F (36.5 C)-98.7 F (37.1 C)] 98.7 F (37.1 C) (03/16 0728) Pulse Rate:  [87-110] 90 (03/16 0728) Resp:  [11-26] 25 (03/16 1100) BP: (96-124)/(60-77) 124/77 mmHg (03/16 0728) SpO2:  [93 %-100 %] 100 % (03/16 1100) Arterial Line BP: (92-117)/(54-72) 93/59 mmHg (03/16 1000) FiO2 (%):  [40 %] 40 % (03/16 0728) Weight:  [105.4 kg (232 lb 5.8 oz)] 105.4 kg (232 lb 5.8 oz) (03/16 0500)   HEMODYNAMICS: CVP:  [15 mmHg-18 mmHg] 18 mmHg   VENTILATOR SETTINGS: Vent Mode:  [-] PRVC FiO2 (%):  [40 %] 40 % Set Rate:  [25 bmp] 25 bmp Vt Set:  [540 mL] 540 mL PEEP:  [5 cmH20] 5 cmH20 Pressure Support:  [10 cmH20] 10 cmH20 Plateau Pressure:  [19 cmH20-24 cmH20] 19 cmH20   INTAKE / OUTPUT:  Intake/Output Summary (Last 24 hours) at 02/24/15 1110 Last data filed at 02/24/15 1101  Gross per 24 hour  Intake 2825.27 ml  Output   3010 ml  Net -184.73 ml   PHYSICAL EXAMINATION: General:  Obese AAM, sedated Neuro:  RASS -2, not following commands, opens eyes to stimulation HEENT:  JVD  decreased. Cardiovascular:  RRR distant. Lungs:  BL scattered rhonchi Abdomen:  obses +bs, no r/g Musculoskeletal:  intact Skin:  Lower ext cellulitis, wrapped  LABS:  CBC  Recent Labs Lab 02/22/15 0422 02/23/15 0400 02/24/15 0410  WBC 22.3* 24.2* 20.2*  HGB 14.1 14.0 13.6  HCT 43.9 44.4 42.9  PLT 146* 124* 144*   BMET  Recent Labs Lab 02/23/15 0400 02/23/15 1620 02/24/15 0410  NA 135 135 133*  K 4.6 4.4 4.4  CL 101 103 102  CO2 BUN 43* 42* 47*  CREATININE 1.53* 1.44* 1.47*  GLUCOSE 219* 227* 217*   Electrolytes  Recent Labs Lab 02/23/15 0400 02/23/15 1620 02/24/15 0410  CALCIUM 8.5 8.4 8.5  MG 2.3  --  2.3  PHOS 2.1* 3.8 2.7   Sepsis Markers  Recent Labs Lab 02/18/15 0430  PROCALCITON 8.46   ABG  Recent Labs Lab 02/22/15 1108 02/23/15 0330 02/24/15 0500  PHART 7.302* 7.372 7.358  PCO2ART 47.4* 43.7 43.8  PO2ART 107.0* 134.0* 125.0*   Liver Enzymes  Recent Labs Lab 02/19/15 0400  02/22/15 1530 02/23/15 0400 02/23/15 1620  AST 173*  --   --  60*  --   ALT 257*  --   --  75*  --   ALKPHOS 105  --   --  204*  --   BILITOT 8.6*  --   --  6.9*  --  ALBUMIN 1.6*  < > 1.5* 1.7* 1.7*  < > = values in this interval not displayed. Cardiac Enzymes No results for input(s): TROPONINI, PROBNP in the last 168 hours. Glucose  Recent Labs Lab 02/23/15 1137 02/23/15 1558 02/23/15 1936 02/23/15 2343 02/24/15 0416 02/24/15 0757  GLUCAP 160* 171* 165* 212* 195* 182*   Imaging Dg Chest Port 1 View  02/23/2015   CLINICAL DATA:  Ventilator.  Short of breath  EXAM: PORTABLE CHEST - 1 VIEW  COMPARISON:  02/22/2015  FINDINGS: Endotracheal tube in good position. Central venous catheter tip in the left innominate vein near the SVC. No pneumothorax. Right jugular catheter tip at the cavoatrial junction.  Left lower lobe consolidation unchanged. Mild right lower lobe consolidation also unchanged. No significant edema or effusion.  IMPRESSION:  Support lines in unchanged position.  Bibasilar consolidation left greater than right unchanged.   Electronically Signed   By: Marlan Palauharles  Clark M.D.   On: 02/23/2015 07:35   ASSESSMENT / PLAN:  PULMONARY OETT 3/8 >> A: Acute Hypoxic Respiratory Failure Pulmonary Edema improved   P:   PS trials but no extubation given mental status. Titrate O2 for sat of 88-92%. VAP bundle.  CARDIOVASCULAR LIJ CVL 3/8 >> RIJ Temp HD Cath 3/8 >> Right Femoral A-line 3/8 >> A: Septic Shock Chronic dCHF Severe pulm htn Echo  3/10 >> RVSP 62, dilated RV P:  Levophed off. Continue stress steroids Map goal 60-65. Off Neo.  RENAL A:   Acute Renal Failure/ATN; likely 2/2 sepsis  Anuric on CRRT Hyponatremia AG Metabolic Acidosis 2/2 acute renal failure/lactic acidosis P:   CRRT per renal -25 ml/hr. BMET in AM. Replace electrolytes as indicated.  GASTROINTESTINAL A:   Obesity GI PPx Protein calorie mal nutrition P:   PPI TF to goal  HEMATOLOGIC A:   DVT PPx P:  Heparin [place] CBC  INFECTIOUS A:   Purulent LE Cellulitis Sepsis/septic Shock Gram neg septic shock P:   BCx2  3/7>>morganella morgani UC  3/8>>ng C diff neg 3/11   Abx: 3/7 vanc>>3/9 3/7 zoysn>>3/9 3/8 Imipenem>>>3/16 3/16 rocephin>>>  D/w Ortho, no action for now.  ENDOCRINE A:  DM P:   SSI. No evidence rel AI but will use steroids since pressor dependent.  NEUROLOGIC A:   Lethargic P:   RASS goal: 0 Fent drip, WUA  FAMILY  - Updates: Spoke with the entire family regarding plan of care.  There are a lot of family dynamic issues.  Did not make recommendations only that we have to decide on trach/peg and that we should continue support for now.  They are currently discussing this.  The patient is critically ill with multiple organ systems failure and requires high complexity decision making for assessment and support, frequent evaluation and titration of therapies, application of advanced monitoring  technologies and extensive interpretation of multiple databases.   Critical Care Time devoted to patient care services described in this note is  35  Minutes. This time reflects time of care of this signee Dr Koren BoundWesam Vergia Chea. This critical care time does not reflect procedure time, or teaching time or supervisory time of PA/NP/Med student/Med Resident etc but could involve care discussion time.  Alyson ReedyWesam G. Shaneequa Bahner, M.D. [facility] Pulmonary/Critical Care Medicine. Pager: [phone]. After hours pager: [phone].

## 2015-02-24 NOTE — Progress Notes (Signed)
RN spoke with Briant CedarMattingly MD in regard of CRRT filter. Orders given to not change filter if current filter clots this evening due to plan of withdrawing care on  02/25/15 .

## 2015-02-24 NOTE — Progress Notes (Signed)
Subjective: Interval History: none.  Objective: Vital signs in last 24 hours: Temp:  [97.7 F (36.5 C)-98.7 F (37.1 C)] 98.7 F (37.1 C) (03/16 0728) Pulse Rate:  [87-110] 90 (03/16 0728) Resp:  [10-26] 25 (03/16 0900) BP: (96-124)/(60-77) 124/77 mmHg (03/16 0728) SpO2:  [93 %-100 %] 100 % (03/16 0900) Arterial Line BP: (92-122)/(54-74) 96/60 mmHg (03/16 0900) FiO2 (%):  [40 %] 40 % (03/16 0728) Weight:  [105.4 kg (232 lb 5.8 oz)] 105.4 kg (232 lb 5.8 oz) (03/16 0500) Weight change: -0.1 kg (-3.5 oz)  Intake/Output from previous day: 03/15 0701 - 03/16 0700 In: 2884.7 [I.V.:853.7; NG/GT:1400; IV Piggyback:601] Out: 3096  Intake/Output this shift: Total I/O In: 161.9 [I.V.:71.9; NG/GT:90] Out: 200 [Other:200]  General appearance: opens eyes and startles Neck: IJ cath Resp: diminished breath sounds bilaterally and rales bibasilar Cardio: S1, S2 normal and systolic murmur: holosystolic 2/6, blowing at apex GI: pos bs, but diminished. obese, abdm wall edema Extremities: edema 3+, has dressings on calves  Lab Results:  Recent Labs  02/23/15 0400 02/24/15 0410  WBC 24.2* 20.2*  HGB 14.0 13.6  HCT 44.4 42.9  PLT 124* 144*   BMET:  Recent Labs  02/23/15 1620 02/24/15 0410  NA 135 133*  K 4.4 4.4  CL 103 102  CO2 22 25  GLUCOSE 227* 217*  BUN 42* 47*  CREATININE 1.44* 1.47*  CALCIUM 8.4 8.5   No results for input(s): PTH in the last 72 hours. Iron Studies:  Recent Labs  02/22/15 0910  IRON 49  TIBC 192*    Studies/Results: Dg Chest Port 1 View  02/24/2015   CLINICAL DATA:  Respiratory failure.  EXAM: PORTABLE CHEST - 1 VIEW  COMPARISON:  02/23/2015  FINDINGS: Endotracheal tube tip is 2 cm above the carina. Nasogastric tube extends into the stomach. Left jugular central line extends to the SVC. Right jugular central line extends to the cavoatrial junction. Central ground-glass opacity and left base consolidation persist.  IMPRESSION: Support equipment in  good position. Persistent airspace opacities without significant interval change.   Electronically Signed   By: Ellery Plunkaniel R Mitchell M.D.   On: 02/24/2015 06:48   Dg Chest Port 1 View  02/23/2015   CLINICAL DATA:  Ventilator.  Short of breath  EXAM: PORTABLE CHEST - 1 VIEW  COMPARISON:  02/22/2015  FINDINGS: Endotracheal tube in good position. Central venous catheter tip in the left innominate vein near the SVC. No pneumothorax. Right jugular catheter tip at the cavoatrial junction.  Left lower lobe consolidation unchanged. Mild right lower lobe consolidation also unchanged. No significant edema or effusion.  IMPRESSION: Support lines in unchanged position.  Bibasilar consolidation left greater than right unchanged.   Electronically Signed   By: Marlan Palauharles  Clark M.D.   On: 02/23/2015 07:35    I have reviewed the patient's current medications.  Assessment/Plan: 1 AKI oliguric ATN.  Vol xs. Acid/base/K ok.  Will lower removal rate with low bp. 2 VDRF per CCM 3 CHF lower vol 4 Nutrition TF 5 Obesty 6 Cellulitis with sepis on AB, dressings P CRRt, AB, vent , TF.  Little progression    LOS: 9 days   Mally Gavina L 02/24/2015,9:18 AM

## 2015-02-24 NOTE — Progress Notes (Signed)
Spoke with daughter regarding plan of care. The family has decided they do not want to go through with trach/peg.  Dr. Molli KnockYacoub made aware. Plans to withdraw care tomorrow 3/17.  Bryce [name], Anjeli Casad J

## 2015-02-25 ENCOUNTER — Inpatient Hospital Stay (HOSPITAL_COMMUNITY): Payer: Commercial Managed Care - HMO

## 2015-02-25 DIAGNOSIS — R4182 Altered mental status, unspecified: Secondary | ICD-10-CM

## 2015-02-25 LAB — COMPREHENSIVE METABOLIC PANEL
ALT: 56 U/L — ABNORMAL HIGH (ref 0–53)
AST: 50 U/L — ABNORMAL HIGH (ref 0–37)
Albumin: 1.9 g/dL — ABNORMAL LOW (ref 3.5–5.2)
Alkaline Phosphatase: 198 U/L — ABNORMAL HIGH (ref 39–117)
Anion gap: 7 (ref 5–15)
BUN: 45 mg/dL — AB (ref 6–23)
CO2: 27 mmol/L (ref 19–32)
CREATININE: 1.5 mg/dL — AB (ref 0.50–1.35)
Calcium: 8.9 mg/dL (ref 8.4–10.5)
Chloride: 101 mmol/L (ref 96–112)
GFR calc Af Amer: 54 mL/min — ABNORMAL LOW (ref >90)
GFR calc non Af Amer: 46 mL/min — ABNORMAL LOW (ref >90)
GLUCOSE: 187 mg/dL — AB (ref 70–99)
Potassium: 4.8 mmol/L (ref 3.5–5.1)
SODIUM: 135 mmol/L (ref 135–145)
Total Bilirubin: 4.6 mg/dL — ABNORMAL HIGH (ref 0.3–1.2)
Total Protein: 7.7 g/dL (ref 6.0–8.3)

## 2015-02-25 LAB — MAGNESIUM: MAGNESIUM: 2.3 mg/dL (ref 1.5–2.5)

## 2015-02-25 LAB — RENAL FUNCTION PANEL
Albumin: 1.8 g/dL — ABNORMAL LOW (ref 3.5–5.2)
Anion gap: 13 (ref 5–15)
BUN: 45 mg/dL — ABNORMAL HIGH (ref 6–23)
CHLORIDE: 100 mmol/L (ref 96–112)
CO2: 19 mmol/L (ref 19–32)
Calcium: 8.3 mg/dL — ABNORMAL LOW (ref 8.4–10.5)
Creatinine, Ser: 1.33 mg/dL (ref 0.50–1.35)
GFR calc Af Amer: 62 mL/min — ABNORMAL LOW (ref >90)
GFR calc non Af Amer: 54 mL/min — ABNORMAL LOW (ref >90)
Glucose, Bld: 230 mg/dL — ABNORMAL HIGH (ref 70–99)
Phosphorus: 2.6 mg/dL (ref 2.3–4.6)
Potassium: 4.4 mmol/L (ref 3.5–5.1)
Sodium: 132 mmol/L — ABNORMAL LOW (ref 135–145)

## 2015-02-25 LAB — POCT ACTIVATED CLOTTING TIME
ACTIVATED CLOTTING TIME: 202 s
ACTIVATED CLOTTING TIME: 214 s
ACTIVATED CLOTTING TIME: 221 s
Activated Clotting Time: 208 seconds
Activated Clotting Time: 196 seconds
Activated Clotting Time: 202 seconds
Activated Clotting Time: 227 seconds
Activated Clotting Time: 214 seconds
Activated Clotting Time: 220 seconds
Activated Clotting Time: 214 seconds

## 2015-02-25 LAB — GLUCOSE, CAPILLARY
GLUCOSE-CAPILLARY: 134 mg/dL — AB (ref 70–99)
GLUCOSE-CAPILLARY: 173 mg/dL — AB (ref 70–99)
GLUCOSE-CAPILLARY: 203 mg/dL — AB (ref 70–99)
Glucose-Capillary: 207 mg/dL — ABNORMAL HIGH (ref 70–99)
Glucose-Capillary: 215 mg/dL — ABNORMAL HIGH (ref 70–99)

## 2015-02-25 LAB — POCT I-STAT 3, ART BLOOD GAS (G3+)
Acid-base deficit: 3 mmol/L — ABNORMAL HIGH (ref 0.0–2.0)
Bicarbonate: 24.2 mEq/L — ABNORMAL HIGH (ref 20.0–24.0)
O2 SAT: 98 %
PCO2 ART: 48.6 mmHg — AB (ref 35.0–45.0)
TCO2: 26 mmol/L (ref 0–100)
pH, Arterial: 7.304 — ABNORMAL LOW (ref 7.350–7.450)
pO2, Arterial: 119 mmHg — ABNORMAL HIGH (ref 80.0–100.0)

## 2015-02-25 LAB — APTT: aPTT: 69 seconds — ABNORMAL HIGH (ref 24–37)

## 2015-02-25 LAB — CBC
HCT: 46 % (ref 39.0–52.0)
HEMOGLOBIN: 14.3 g/dL (ref 13.0–17.0)
MCH: 29.5 pg (ref 26.0–34.0)
MCHC: 31.1 g/dL (ref 30.0–36.0)
MCV: 95 fL (ref 78.0–100.0)
Platelets: 141 10*3/uL — ABNORMAL LOW (ref 150–400)
RBC: 4.84 MIL/uL (ref 4.22–5.81)
RDW: 18.3 % — ABNORMAL HIGH (ref 11.5–15.5)
WBC: 25.9 10*3/uL — ABNORMAL HIGH (ref 4.0–10.5)

## 2015-02-25 LAB — PHOSPHORUS: PHOSPHORUS: 2.9 mg/dL (ref 2.3–4.6)

## 2015-02-25 MED ORDER — HYDROCORTISONE NA SUCCINATE PF 100 MG IJ SOLR
25.0000 mg | Freq: Four times a day (QID) | INTRAMUSCULAR | Status: DC
  Administered 2015-02-25 – 2015-02-26 (×5): 25 mg via INTRAVENOUS
  Filled 2015-02-25 (×8): qty 0.5

## 2015-02-25 MED ORDER — LABETALOL HCL 5 MG/ML IV SOLN
1.0000 mg/min | INTRAVENOUS | Status: DC
  Administered 2015-02-25: 1 mg/min via INTRAVENOUS
  Filled 2015-02-25: qty 100

## 2015-02-25 MED ORDER — DEXMEDETOMIDINE HCL IN NACL 400 MCG/100ML IV SOLN
0.4000 ug/kg/h | INTRAVENOUS | Status: DC
  Administered 2015-02-25 (×3): 0.4 ug/kg/h via INTRAVENOUS
  Administered 2015-02-25 – 2015-02-26 (×3): 0.3 ug/kg/h via INTRAVENOUS
  Administered 2015-02-26: 0.5 ug/kg/h via INTRAVENOUS
  Administered 2015-02-26: 0.2 ug/kg/h via INTRAVENOUS
  Administered 2015-02-27: 0.8 ug/kg/h via INTRAVENOUS
  Administered 2015-02-27: 0.7 ug/kg/h via INTRAVENOUS
  Administered 2015-02-27: 0.9 ug/kg/h via INTRAVENOUS
  Administered 2015-02-27: 0.6 ug/kg/h via INTRAVENOUS
  Administered 2015-02-27: 0.8 ug/kg/h via INTRAVENOUS
  Administered 2015-02-28: 0.9 ug/kg/h via INTRAVENOUS
  Administered 2015-02-28: 0.8 ug/kg/h via INTRAVENOUS
  Administered 2015-02-28 – 2015-03-01 (×2): 0.9 ug/kg/h via INTRAVENOUS
  Administered 2015-03-01: 1 ug/kg/h via INTRAVENOUS
  Filled 2015-02-25 (×3): qty 100
  Filled 2015-02-25: qty 50
  Filled 2015-02-25 (×2): qty 100
  Filled 2015-02-25 (×5): qty 50
  Filled 2015-02-25 (×3): qty 100
  Filled 2015-02-25 (×6): qty 50
  Filled 2015-02-25 (×2): qty 100

## 2015-02-25 NOTE — Progress Notes (Signed)
Subjective: Interval History: has no complaints, sedated on vent.  .  Objective: Vital signs in last 24 hours: Temp:  [97.8 F (36.6 C)-98.7 F (37.1 C)] 97.9 F (36.6 C) (03/17 0800) Pulse Rate:  [88-91] 88 (03/16 2000) Resp:  [16-26] 24 (03/17 0700) BP: (104-110)/(62-67) 104/62 mmHg (03/16 1700) SpO2:  [92 %-100 %] 100 % (03/17 0800) Arterial Line BP: (88-187)/(53-118) 153/93 mmHg (03/17 0700) FiO2 (%):  [40 %] 40 % (03/17 0800) Weight:  [104.8 kg (231 lb 0.7 oz)] 104.8 kg (231 lb 0.7 oz) (03/17 0500) Weight change: -0.6 kg (-1 lb 5.2 oz)  Intake/Output from previous day: 03/16 0701 - 03/17 0700 In: 2432 [I.V.:932; NG/GT:1350; IV Piggyback:150] Out: 3049 [Emesis/NG output:60] Intake/Output this shift: Total I/O In: 85 [I.V.:40; NG/GT:45] Out: 101 [Other:101]  General appearance: sedated on vent Neck: IJ cath Resp: diminished breath sounds bibasilar and rales bibasilar Cardio: S1, S2 normal and systolic murmur: holosystolic 2/6, blowing at apex GI: pos bs, soft, liver down 5 cm Extremities: edema 1+ and Unna boots on feet.   Lab Results:  Recent Labs  02/24/15 0410 02/25/15 0330  WBC 20.2* 25.9*  HGB 13.6 14.3  HCT 42.9 46.0  PLT 144* 141*   BMET:  Recent Labs  02/24/15 1530 02/25/15 0330  NA 134* 135  K 4.5 4.8  CL 102 101  CO2 23 27  GLUCOSE 241* 187*  BUN 45* 45*  CREATININE 1.46* 1.50*  CALCIUM 8.4 8.9   No results for input(s): PTH in the last 72 hours. Iron Studies:  Recent Labs  02/22/15 0910  IRON 49  TIBC 192*    Studies/Results: Dg Chest Port 1 View  02/25/2015   CLINICAL DATA:  Respiratory failure  EXAM: PORTABLE CHEST - 1 VIEW  COMPARISON:  02/24/2015  FINDINGS: Endotracheal tube tip is 2 cm above the carina. Nasogastric tube extends below the diaphragm and off the inferior edge of the image. There is a right jugular central line extending into the cavoatrial junction region. There is a left jugular central line extending into the  SVC. There is cardiomegaly, unchanged. There is consolidation in the left base and ground-glass opacity in the right base, unchanged.  IMPRESSION: Support equipment appears satisfactorily positioned. No interval change in the basilar opacities.   Electronically Signed   By: Ellery Plunkaniel R Mitchell M.D.   On: 02/25/2015 06:50   Dg Chest Port 1 View  02/24/2015   CLINICAL DATA:  Respiratory failure.  EXAM: PORTABLE CHEST - 1 VIEW  COMPARISON:  02/23/2015  FINDINGS: Endotracheal tube tip is 2 cm above the carina. Nasogastric tube extends into the stomach. Left jugular central line extends to the SVC. Right jugular central line extends to the cavoatrial junction. Central ground-glass opacity and left base consolidation persist.  IMPRESSION: Support equipment in good position. Persistent airspace opacities without significant interval change.   Electronically Signed   By: Ellery Plunkaniel R Mitchell M.D.   On: 02/24/2015 06:48    I have reviewed the patient's current medications.  Assessment/Plan: 1 AKI/CKD  Solute/acid/base/K ok. Removing fluid slowly and tol.  Off pressors 2 VDRF per CCM 3 [place] TF 4 Obesity 5 DM fair control 6 Cellulitis with sepsis on AB 7 CHF P CRRT, family conf today, AB, Vent,     LOS: 10 days   Oceana Walthall L 02/25/2015,8:11 AM

## 2015-02-25 NOTE — Progress Notes (Signed)
Fentanyl 30 mls wasted in sink and witnessed by Melina CopaSarah RN.

## 2015-02-25 NOTE — Progress Notes (Signed)
PULMONARY / CRITICAL CARE MEDICINE   Name: Bryce [name] MRN: [id number] DOB: 02/23/1946    ADMISSION DATE:  03/03/2015 CONSULTATION DATE:  3/8  REFERRING MD : Triad  CHIEF COMPLAINT:  AMS  INITIAL PRESENTATION: 69 y/o M w/ PMHx of HTN, HLD, DM type II, chronic dCHF, BPH, and RBBB, admitted w/ purulent LE cellulitis, and oliguric AKI (Cr >6).  PCCM consulted for acute hypoxic respiratory failure and septic shock.   STUDIES:  3/8 [place] RUQ>>>No cholelithiasis or sonographic evidence of acute cholecystitis. 2. Heterogeneous hepatic echogenicity as can be seen with hepatocellular disease. 3/9 CT lower ext>>>soft tissue edema , no drainable fluid collection.  SIGNIFICANT EVENTS: 3/8 >> Transfer to ICU, shock, acidotic, intubation, pressors 3/8 - gram neg in blood, cvvhd 3/9 - CT done legs, continued high pressors  SUBJECTIVE: off pressors, opens eyes but not following commands  VITAL SIGNS: Temp:  [97.8 F (36.6 C)-98.7 F (37.1 C)] 97.9 F (36.6 C) (03/17 0800) Pulse Rate:  [88-91] 88 (03/16 2000) Resp:  [16-26] 24 (03/17 0700) BP: (104-110)/(62-67) 104/62 mmHg (03/16 1700) SpO2:  [92 %-100 %] 100 % (03/17 0800) Arterial Line BP: (88-187)/(53-118) 153/93 mmHg (03/17 0700) FiO2 (%):  [40 %] 40 % (03/17 0800) Weight:  [104.8 kg (231 lb 0.7 oz)] 104.8 kg (231 lb 0.7 oz) (03/17 0500)   HEMODYNAMICS: CVP:  [13 mmHg-34 mmHg] 13 mmHg   VENTILATOR SETTINGS: Vent Mode:  [-] PRVC FiO2 (%):  [40 %] 40 % Set Rate:  [25 bmp] 25 bmp Vt Set:  [540 mL] 540 mL PEEP:  [5 cmH20] 5 cmH20 Plateau Pressure:  [20 cmH20] 20 cmH20   INTAKE / OUTPUT:  Intake/Output Summary (Last 24 hours) at 02/25/15 0813 Last data filed at 02/25/15 0800  Gross per 24 hour  Intake 2435.08 ml  Output   3058 ml  Net -622.92 ml   PHYSICAL EXAMINATION: General:  Obese AAM, agitated off sedation but not following commands. Neuro:  RASS -1, not following commands, opens eyes to stimulation, blinks to threat,  pupils are reactive. HEENT:  JVD decreased. Cardiovascular:  RRR distant. Lungs:  Coarse BS diffusely. Abdomen:  obses +bs, no r/g Musculoskeletal:  intact Skin:  Lower ext cellulitis, wrapped  LABS:  CBC  Recent Labs Lab 02/23/15 0400 02/24/15 0410 02/25/15 0330  WBC 24.2* 20.2* 25.9*  HGB 14.0 13.6 14.3  HCT 44.4 42.9 46.0  PLT 124* 144* 141*   BMET  Recent Labs Lab 02/24/15 0410 02/24/15 1530 02/25/15 0330  NA 133* 134* 135  K 4.4 4.5 4.8  CL 102 102 101  CO2 25 23 27   BUN 47* 45* 45*  CREATININE 1.47* 1.46* 1.50*  GLUCOSE 217* 241* 187*   Electrolytes  Recent Labs Lab 02/23/15 0400  02/24/15 0410 02/24/15 1530 02/25/15 0330  CALCIUM 8.5  < > 8.5 8.4 8.9  MG 2.3  --  2.3  --  2.3  PHOS 2.1*  < > 2.7 2.2* 2.9  < > = values in this interval not displayed. Sepsis Markers No results for input(s): LATICACIDVEN, PROCALCITON, O2SATVEN in the last 168 hours. ABG  Recent Labs Lab 02/23/15 0330 02/24/15 0500 02/25/15 0454  PHART 7.372 7.358 7.304*  PCO2ART 43.7 43.8 48.6*  PO2ART 134.0* 125.0* 119.0*   Liver Enzymes  Recent Labs Lab 02/19/15 0400  02/23/15 0400 02/23/15 1620 02/24/15 1530 02/25/15 0330  AST 173*  --  60*  --   --  50*  ALT 257*  --  75*  --   --  56*  ALKPHOS 105  --  204*  --   --  198*  BILITOT 8.6*  --  6.9*  --   --  4.6*  ALBUMIN 1.6*  < > 1.7* 1.7* 1.7* 1.9*  < > = values in this interval not displayed. Cardiac Enzymes No results for input(s): TROPONINI, PROBNP in the last 168 hours. Glucose  Recent Labs Lab 02/24/15 0757 02/24/15 1151 02/24/15 1521 02/24/15 2003 02/25/15 0014 02/25/15 0731  GLUCAP 182* 181* 192* 192* 207* 173*   Imaging Dg Chest Port 1 View  02/24/2015   CLINICAL DATA:  Respiratory failure.  EXAM: PORTABLE CHEST - 1 VIEW  COMPARISON:  02/23/2015  FINDINGS: Endotracheal tube tip is 2 cm above the carina. Nasogastric tube extends into the stomach. Left jugular central line extends to the SVC.  Right jugular central line extends to the cavoatrial junction. Central ground-glass opacity and left base consolidation persist.  IMPRESSION: Support equipment in good position. Persistent airspace opacities without significant interval change.   Electronically Signed   By: Ellery Plunk M.D.   On: 02/24/2015 06:48   ASSESSMENT / PLAN:  PULMONARY OETT 3/8 >> A: Acute Hypoxic Respiratory Failure Pulmonary Edema improved   P:   PS trials but no extubation given mental status. Titrate O2 for sat of 88-92%. VAP bundle.  CARDIOVASCULAR LIJ CVL 3/8 >> RIJ Temp HD Cath 3/8 >> Right Femoral A-line 3/8 >> A: Septic Shock Chronic dCHF Severe pulm htn Echo  3/10 >> RVSP 62, dilated RV P:  Levophed off, severely hypertensive now. Labetalol drip. D/C levophed. Taper stress steroids steroids Map goal 60-65. Off Neo.  RENAL A:   Acute Renal Failure/ATN; likely 2/2 sepsis  Anuric on CRRT Hyponatremia AG Metabolic Acidosis 2/2 acute renal failure/lactic acidosis P:   CRRT per renal -25 ml/hr. BMET in AM. Replace electrolytes as indicated.  GASTROINTESTINAL A:   Obesity GI PPx Protein calorie mal nutrition P:   PPI TF to goal  HEMATOLOGIC A:   DVT PPx P:  Heparin [place] CBC  INFECTIOUS A:   Purulent LE Cellulitis Sepsis/septic Shock Gram neg septic shock P:   BCx2  3/7>>morganella morgani UC  3/8>>ng C diff neg 3/11   Abx: 3/7 vanc>>3/9 3/7 zoysn>>3/9 3/8 Imipenem>>>3/16 3/16 rocephin>>>  D/w Ortho, no action for now.  ENDOCRINE A:  DM P:   SSI. No evidence rel AI but will use steroids since pressor dependent.  NEUROLOGIC A:   Lethargic P:   RASS goal: 0. Fent drip, WUA Add precedex drip.  FAMILY  - Updates: No family bedside.  Patient is showing signs of improvement, actually opens his eyes today and hemodynamically becoming more stable.  Will continue abx and add labetalol for BP control.  Precedex in place of versed.  Will give patient  another day to see his functional status improvement but family does not wish for a trach/peg.  The patient is critically ill with multiple organ systems failure and requires high complexity decision making for assessment and support, frequent evaluation and titration of therapies, application of advanced monitoring technologies and extensive interpretation of multiple databases.   Critical Care Time devoted to patient care services described in this note is  35  Minutes. This time reflects time of care of this signee Dr Koren Bound. This critical care time does not reflect procedure time, or teaching time or supervisory time of PA/NP/Med student/Med Resident etc but could involve care discussion time.  Alyson Reedy, M.D. [facility] Pulmonary/Critical Care Medicine.  Pager: [phone]. After hours pager: [phone].

## 2015-02-25 NOTE — Plan of Care (Signed)
Problem: Phase I Progression Outcomes Goal: Voiding-avoid urinary catheter unless indicated Outcome: Not Applicable Date Met:  97/02/63 Pt is anuric

## 2015-02-26 ENCOUNTER — Inpatient Hospital Stay (HOSPITAL_COMMUNITY): Payer: Commercial Managed Care - HMO

## 2015-02-26 LAB — BLOOD GAS, ARTERIAL
ACID-BASE DEFICIT: 1.2 mmol/L (ref 0.0–2.0)
Bicarbonate: 23 mEq/L (ref 20.0–24.0)
Drawn by: 235321
FIO2: 0.3 %
LHR: 25 {breaths}/min
MECHVT: 540 mL
O2 Saturation: 98.5 %
PATIENT TEMPERATURE: 97.5
PEEP/CPAP: 5 cmH2O
TCO2: 24.2 mmol/L (ref 0–100)
pCO2 arterial: 37.1 mmHg (ref 35.0–45.0)
pH, Arterial: 7.406 (ref 7.350–7.450)
pO2, Arterial: 113 mmHg — ABNORMAL HIGH (ref 80.0–100.0)

## 2015-02-26 LAB — BASIC METABOLIC PANEL
ANION GAP: 5 (ref 5–15)
BUN: 45 mg/dL — AB (ref 6–23)
CHLORIDE: 103 mmol/L (ref 96–112)
CO2: 28 mmol/L (ref 19–32)
Calcium: 8.7 mg/dL (ref 8.4–10.5)
Creatinine, Ser: 1.22 mg/dL (ref 0.50–1.35)
GFR calc non Af Amer: 60 mL/min — ABNORMAL LOW (ref >90)
GFR, EST AFRICAN AMERICAN: 69 mL/min — AB (ref >90)
Glucose, Bld: 177 mg/dL — ABNORMAL HIGH (ref 70–99)
POTASSIUM: 4.5 mmol/L (ref 3.5–5.1)
Sodium: 136 mmol/L (ref 135–145)

## 2015-02-26 LAB — GLUCOSE, CAPILLARY
GLUCOSE-CAPILLARY: 283 mg/dL — AB (ref 70–99)
GLUCOSE-CAPILLARY: 255 mg/dL — AB (ref 70–99)
Glucose-Capillary: 204 mg/dL — ABNORMAL HIGH (ref 70–99)
Glucose-Capillary: 165 mg/dL — ABNORMAL HIGH (ref 70–99)
Glucose-Capillary: 205 mg/dL — ABNORMAL HIGH (ref 70–99)

## 2015-02-26 LAB — RENAL FUNCTION PANEL
Albumin: 2 g/dL — ABNORMAL LOW (ref 3.5–5.2)
Anion gap: 8 (ref 5–15)
BUN: 45 mg/dL — AB (ref 6–23)
CO2: 25 mmol/L (ref 19–32)
Calcium: 8.5 mg/dL (ref 8.4–10.5)
Chloride: 101 mmol/L (ref 96–112)
Creatinine, Ser: 1.35 mg/dL (ref 0.50–1.35)
GFR calc Af Amer: 61 mL/min — ABNORMAL LOW (ref >90)
GFR calc non Af Amer: 53 mL/min — ABNORMAL LOW (ref >90)
Glucose, Bld: 236 mg/dL — ABNORMAL HIGH (ref 70–99)
PHOSPHORUS: 2.7 mg/dL (ref 2.3–4.6)
POTASSIUM: 4.5 mmol/L (ref 3.5–5.1)
SODIUM: 134 mmol/L — AB (ref 135–145)

## 2015-02-26 LAB — ALBUMIN: Albumin: 1.9 g/dL — ABNORMAL LOW (ref 3.5–5.2)

## 2015-02-26 LAB — MAGNESIUM: MAGNESIUM: 2.3 mg/dL (ref 1.5–2.5)

## 2015-02-26 LAB — POCT ACTIVATED CLOTTING TIME
ACTIVATED CLOTTING TIME: 190 s
ACTIVATED CLOTTING TIME: 196 s
ACTIVATED CLOTTING TIME: 196 s
ACTIVATED CLOTTING TIME: 196 s
ACTIVATED CLOTTING TIME: 196 s
Activated Clotting Time: 183 seconds
Activated Clotting Time: 190 seconds
Activated Clotting Time: 196 seconds

## 2015-02-26 LAB — PARATHYROID HORMONE, INTACT (NO CA): PTH: 211 pg/mL — ABNORMAL HIGH (ref 15–65)

## 2015-02-26 LAB — APTT: aPTT: 72 seconds — ABNORMAL HIGH (ref 24–37)

## 2015-02-26 LAB — CBC
HCT: 43.8 % (ref 39.0–52.0)
HEMOGLOBIN: 14.1 g/dL (ref 13.0–17.0)
MCH: 29.5 pg (ref 26.0–34.0)
MCHC: 32.2 g/dL (ref 30.0–36.0)
MCV: 91.6 fL (ref 78.0–100.0)
Platelets: 144 10*3/uL — ABNORMAL LOW (ref 150–400)
RBC: 4.78 MIL/uL (ref 4.22–5.81)
RDW: 18.8 % — ABNORMAL HIGH (ref 11.5–15.5)
WBC: 23.5 10*3/uL — AB (ref 4.0–10.5)

## 2015-02-26 LAB — PHOSPHORUS: Phosphorus: 2.1 mg/dL — ABNORMAL LOW (ref 2.3–4.6)

## 2015-02-26 MED ORDER — HYDROCORTISONE NA SUCCINATE PF 100 MG IJ SOLR
50.0000 mg | Freq: Four times a day (QID) | INTRAMUSCULAR | Status: DC
  Administered 2015-02-26 – 2015-03-01 (×12): 50 mg via INTRAVENOUS
  Filled 2015-02-26 (×15): qty 1

## 2015-02-26 MED ORDER — SODIUM PHOSPHATE 3 MMOLE/ML IV SOLN
10.0000 mmol | Freq: Once | INTRAVENOUS | Status: AC
  Administered 2015-02-26: 10 mmol via INTRAVENOUS
  Filled 2015-02-26: qty 3.33

## 2015-02-26 NOTE — Progress Notes (Signed)
NUTRITION FOLLOW-UP  INTERVENTION: Continue Vital High Protein @ goal rate of 45 ml/hr via OG tube    60 ml Prostat TID.    Tube feeding regimen provides 1680 kcal (14 kcal/kg of ABW), 184 grams of protein, and 902 ml of H2O.   NUTRITION DIAGNOSIS: Increased nutrient needs related to wounds as evidenced by estimated needs; ongoing  Goal: Enteral nutrition to provide 60-70% of estimated calorie needs (22-25 kcals/kg ideal body weight) and 100% of estimated protein needs, based on ASPEN guidelines for hypocaloric, high protein feeding in critically ill obese individuals; met  Monitor:  Vent status, CRRT, TF tolerance/adequacy, weight trends, labs   ASSESSMENT: Pt with hx of HTN, CHF, and DM who has significant lower extremity cellulitis and skin maceration of buttock and perirectal area followed by [facility] wound clinic. Pt admitted after he fell at home with acute renal failure, pt lives alone and with purulent drainage from wounds.  Pt became hypoxic with hypotension and transferred to ICU and intubated.   Patient is currently intubated on ventilator support MV: 13.5 L/min Temp (24hrs), Avg:96.5 F (35.8 C), Min:93.5 F (34.2 C), Max:97.7 F (36.5 C)  Still receiving senna. Talked with nurse about pt. Plan to attempt weaning off ventilator today. Pt continues to tolerate TF with low residuals and regular BM.  Labs reviewed: Elevated BUN, glucose Low phosphorous     Height: Ht Readings from Last 1 Encounters:  02/10/2015 _0  (1.702 m)    Weight: Wt Readings from Last 1 Encounters:  02/26/15 227 lb 11.8 oz (103.3 kg)    BMI:  Body mass index is 35.66 kg/(m^2).  Estimated Nutritional Needs: Kcal: 1224-8250 Protein: 168 grams Fluid: > 1.2 L/day  Skin:  Maceration of buttocks Stage II left leg with loose skin flap Stage II right leg with cellulitis  Diet Order: Diet NPO time specified   Intake/Output Summary (Last 24 hours) at 02/26/15 0910 Last data filed at  02/26/15 0800  Gross per 24 hour  Intake 2186.66 ml  Output   2659 ml  Net -472.34 ml    Last BM: 3/18, rectal tube  Per RN, "pasty"  Labs:   Recent Labs Lab 02/24/15 0410  02/25/15 0330 02/25/15 1600 02/26/15 0420  NA 133*  < > 135 132* 136  K 4.4  < > 4.8 4.4 4.5  CL 102  < > 101 100 103  CO2 25  < > _1 BUN 47*  < > 45* 45* 45*  CREATININE 1.47*  < > 1.50* 1.33 1.22  CALCIUM 8.5  < > 8.9 8.3* 8.7  MG 2.3  --  2.3  --  2.3  PHOS 2.7  < > 2.9 2.6 2.1*  GLUCOSE 217*  < > 187* 230* 177*  < > = values in this interval not displayed.  CBG (last 3)   Recent Labs  02/25/15 2328 02/26/15 0330 02/26/15 0734  GLUCAP 255* 204* 165*    Scheduled Meds: . antiseptic oral rinse  7 mL Mouth Rinse QID  . aspirin EC  81 mg Oral q morning - 10a  . cefTRIAXone (ROCEPHIN)  IV  2 g Intravenous Q24H  . chlorhexidine  15 mL Mouth Rinse BID  . Chlorhexidine Gluconate Cloth  6 each Topical Q0600  . feeding supplement (PRO-STAT SUGAR FREE 64)  60 mL Per Tube TID  . feeding supplement (VITAL HIGH PROTEIN)  1,000 mL Per Tube Q24H  . folic acid  1 mg Oral q morning -  10a  . hydrocortisone sod succinate (SOLU-CORTEF) inj  25 mg Intravenous Q6H  . insulin aspart  0-15 Units Subcutaneous 6 times per day  . mupirocin ointment  1 application Nasal BID  . pantoprazole sodium  40 mg Per Tube Q1200  . senna  1 tablet Oral BID  . sodium chloride  10-40 mL Intracatheter Q12H  . sodium phosphate  Dextrose 5% IVPB  10 mmol Intravenous Once  . thiamine  100 mg Oral Daily    Continuous Infusions: . sodium chloride 10 mL/hr at 02/26/15 0800  . dexmedetomidine 0.3 mcg/kg/hr (02/26/15 0800)  . fentaNYL infusion INTRAVENOUS 75 mcg/hr (02/26/15 0800)  . heparin 10,000 units/ 20 mL infusion syringe 1,750 Units/hr (02/26/15 0800)  . norepinephrine (LEVOPHED) Adult infusion 5 mcg/min (02/26/15 0730)  . phenylephrine (NEO-SYNEPHRINE) Adult infusion Stopped (02/16/15 2055)  . dialysis  replacement fluid (prismasate) 400 mL/hr at 02/26/15 0604  . dialysis replacement fluid (prismasate) 200 mL/hr at 02/26/15 0748  . dialysate (PRISMASATE) 1,500 mL/hr at 02/26/15 0815  . vasopressin (PITRESSIN) infusion - *FOR SHOCK*      Wynona Dove, MS Dietetic Intern Pager: [phone]

## 2015-02-26 NOTE — Progress Notes (Signed)
PULMONARY / CRITICAL CARE MEDICINE   Name: Bryce [name] MRN: [id number] DOB: [birth year]    ADMISSION DATE:  03/14/15 CONSULTATION DATE:  3/8  REFERRING MD : Triad  CHIEF COMPLAINT:  AMS  INITIAL PRESENTATION: 69 y/o M w/ PMHx of HTN, HLD, DM type II, chronic dCHF, BPH, and RBBB, admitted w/ purulent LE cellulitis, and oliguric AKI (Cr >6).  PCCM consulted for acute hypoxic respiratory failure and septic shock.   STUDIES:  3/8 [place] RUQ>>>No cholelithiasis or sonographic evidence of acute cholecystitis. 2. Heterogeneous hepatic echogenicity as can be seen with hepatocellular disease. 3/9 CT lower ext>>>soft tissue edema , no drainable fluid collection.  SIGNIFICANT EVENTS: 3/8 >> Transfer to ICU, shock, acidotic, intubation, pressors 3/8 - gram neg in blood, cvvhd 3/9 - CT done legs, continued high pressors  SUBJECTIVE: off pressors, opens eyes but not following commands  VITAL SIGNS: Temp:  [93.5 F (34.2 C)-97.7 F (36.5 C)] 97.7 F (36.5 C) (03/18 0800) Pulse Rate:  [44-46] 44 (03/17 1606) Resp:  [18-25] 25 (03/18 1000) BP: (92-110)/(61-70) 110/70 mmHg (03/17 1606) SpO2:  [92 %-100 %] 100 % (03/18 1000) Arterial Line BP: (72-159)/(45-94) 119/72 mmHg (03/18 1000) FiO2 (%):  [30 %-40 %] 30 % (03/18 0840) Weight:  [103.3 kg (227 lb 11.8 oz)] 103.3 kg (227 lb 11.8 oz) (03/18 0440)   HEMODYNAMICS:     VENTILATOR SETTINGS: Vent Mode:  [-] PRVC FiO2 (%):  [30 %-40 %] 30 % Set Rate:  [25 bmp] 25 bmp Vt Set:  [540 mL] 540 mL PEEP:  [5 cmH20] 5 cmH20 Plateau Pressure:  [16 cmH20-22 cmH20] 16 cmH20   INTAKE / OUTPUT:  Intake/Output Summary (Last 24 hours) at 02/26/15 1039 Last data filed at 02/26/15 1000  Gross per 24 hour  Intake 2219.63 ml  Output   2909 ml  Net -689.37 ml   PHYSICAL EXAMINATION: General:  Obese AAM, agitated off sedation and following some commands Neuro:  RASS -1, following commands, opens eyes to stimulation, blinks to threat, pupils are  reactive. HEENT:  JVD decreased. Cardiovascular:  RRR distant. Lungs:  Coarse BS diffusely. Abdomen:  obses +bs, no r/g Musculoskeletal:  intact Skin:  Lower ext cellulitis, wrapped  LABS:  CBC  Recent Labs Lab 02/24/15 0410 02/25/15 0330 02/26/15 0420  WBC 20.2* 25.9* 23.5*  HGB 13.6 14.3 14.1  HCT 42.9 46.0 43.8  PLT 144* 141* 144*   BMET  Recent Labs Lab 02/25/15 0330 02/25/15 1600 02/26/15 0420  NA 135 132* 136  K 4.8 4.4 4.5  CL 101 100 103  CO2 BUN 45* 45* 45*  CREATININE 1.50* 1.33 1.22  GLUCOSE 187* 230* 177*   Electrolytes  Recent Labs Lab 02/24/15 0410  02/25/15 0330 02/25/15 1600 02/26/15 0420  CALCIUM 8.5  < > 8.9 8.3* 8.7  MG 2.3  --  2.3  --  2.3  PHOS 2.7  < > 2.9 2.6 2.1*  < > = values in this interval not displayed. Sepsis Markers No results for input(s): LATICACIDVEN, PROCALCITON, O2SATVEN in the last 168 hours. ABG  Recent Labs Lab 02/24/15 0500 02/25/15 0454 02/26/15 0320  PHART 7.358 7.304* 7.406  PCO2ART 43.8 48.6* 37.1  PO2ART 125.0* 119.0* 113.0*   Liver Enzymes  Recent Labs Lab 02/23/15 0400  02/25/15 0330 02/25/15 1600 02/26/15 0420  AST 60*  --  50*  --   --   ALT 75*  --  56*  --   --   ALKPHOS  204*  --  198*  --   --   BILITOT 6.9*  --  4.6*  --   --   ALBUMIN 1.7*  < > 1.9* 1.8* 1.9*  < > = values in this interval not displayed. Cardiac Enzymes No results for input(s): TROPONINI, PROBNP in the last 168 hours. Glucose  Recent Labs Lab 02/25/15 1139 02/25/15 1602 02/25/15 1937 02/25/15 2328 02/26/15 0330 02/26/15 0734  GLUCAP 215* 203* 283* 255* 204* 165*   Imaging Dg Chest Port 1 View  02/25/2015   CLINICAL DATA:  Respiratory failure  EXAM: PORTABLE CHEST - 1 VIEW  COMPARISON:  02/24/2015  FINDINGS: Endotracheal tube tip is 2 cm above the carina. Nasogastric tube extends below the diaphragm and off the inferior edge of the image. There is a right jugular central line extending into the  cavoatrial junction region. There is a left jugular central line extending into the SVC. There is cardiomegaly, unchanged. There is consolidation in the left base and ground-glass opacity in the right base, unchanged.  IMPRESSION: Support equipment appears satisfactorily positioned. No interval change in the basilar opacities.   Electronically Signed   By: Ellery Plunkaniel R Mitchell M.D.   On: 02/25/2015 06:50   ASSESSMENT / PLAN:  PULMONARY OETT 3/8 >> A: Acute Hypoxic Respiratory Failure Pulmonary Edema improved   P:   PS trials but no extubation given mental status and fluid overload. Family does not wish for trach/peg, if not extubatable after the weekend then they are ok with withdrawal of care. Titrate O2 for sat of 88-92%. VAP bundle.  CARDIOVASCULAR LIJ CVL 3/8 >> RIJ Temp HD Cath 3/8 >> Right Femoral A-line 3/8 >> A: Septic Shock Chronic dCHF Severe pulm htn Echo  3/10 >> RVSP 62, dilated RV P:  Levophed off, severely hypertensive now. D/C Labetalol drip. Levophed as needed. Increase dose to full stress steroids steroids Map goal 60-65. Off Neo.  RENAL A:   Acute Renal Failure/ATN; likely 2/2 sepsis  Anuric on CRRT Hyponatremia AG Metabolic Acidosis 2/2 acute renal failure/lactic acidosis P:   CRRT per renal -75 ml/hr, hopefully will help with weaning efforts. BMET in AM. Replace electrolytes as indicated.  GASTROINTESTINAL A:   Obesity GI PPx Protein calorie mal nutrition P:   PPI TF to goal  HEMATOLOGIC A:   DVT PPx P:  Heparin [place] CBC  INFECTIOUS A:   Purulent LE Cellulitis Sepsis/septic Shock Gram neg septic shock P:   BCx2  3/7>>morganella morgani UC  3/8>>ng C diff neg 3/11   Abx: 3/7 vanc>>3/9 3/7 zoysn>>3/9 3/8 Imipenem>>>3/16 3/16 rocephin>>>  D/w Ortho, no action for now.  ENDOCRINE A:  DM P:   SSI. No evidence rel AI but will use steroids since pressor dependent.  NEUROLOGIC A:   Lethargic P:   RASS goal: 0. Fent drip,  WUA Precedex drip.  FAMILY  - Updates: No family bedside.  Patient is showing signs of improvement, actually following commands today and hemodynamically becoming more stable.  Will continue abx andBP support.  Precedex in place of versed.  Will give patient through the weekend, if no improvement by Monday then withdrawal.  The patient is critically ill with multiple organ systems failure and requires high complexity decision making for assessment and support, frequent evaluation and titration of therapies, application of advanced monitoring technologies and extensive interpretation of multiple databases.   Critical Care Time devoted to patient care services described in this note is  35  Minutes. This time reflects time of  care of this signee Dr Jennet Maduro. This critical care time does not reflect procedure time, or teaching time or supervisory time of PA/NP/Med student/Med Resident etc but could involve care discussion time.  Rush Farmer, M.D. [facility] Pulmonary/Critical Care Medicine. Pager: [phone]. After hours pager: [phone].

## 2015-02-26 NOTE — Progress Notes (Signed)
Inpatient Diabetes Program Recommendations  AACE/ADA: New Consensus Statement on Inpatient Glycemic Control (2013)  Target Ranges:  Prepandial:   less than 140 mg/dL      Peak postprandial:   less than 180 mg/dL (1-2 hours)      Critically ill patients:  140 - 180 mg/dL   Results for Bryce [name], Bryce [name] (MRN [id number]) as of 02/26/2015 12:47  Ref. Range 02/26/2015 03:30 02/26/2015 07:34 02/26/2015 11:30  Glucose-Capillary Latest Range: 70-99 mg/dL 782204 (H) 956165 (H) 213205 (H)    Diabetes history: DM 2 Outpatient Diabetes medications: Metformin 500 mg BID Current orders for Inpatient glycemic control: Novolog 0-15 Q4hrs  Vital HP 45 ml/hr Solucortef 50 mg Q6hrs  Inpatient Diabetes Program Recommendations  Insulin - Tube Feed Coverage: Please consider Novolog 4 units Q4hrs tube feed coverage (do not give if tube feeds are stopped or held for any reason).  Thanks,  Christena DeemShannon Gailyn Crook RN, MSN, [facility] Inpatient Diabetes Coordinator Team Pager [phone]

## 2015-02-26 NOTE — Consult Note (Signed)
WOC wound follow up Wound type: venous stasis ulcers bilateral. L >R   Wound ZOX:WRUEbed:Left is now 30cm x circumferential partial thickness skin loss was large ruptured bulla. The bulla over the left lateral ankle has ruptured and today was hanging loose so I was able to trim the roof away to clear the thick, purulent drainage completely.  Right lateral: 11cm x 10cm x 0.2cm, mostly clean, less fibrin today but increased drainage with compression. Edema much improved.  Drainage (amount, consistency, odor)moderate serosanguinous with some increasing yellow drainage from the right lateral wound.  The left leg is much worse and is actively oozing with the dressing change today especially posteriorly.  He is on  CRRT with heparin therefore I am not surprised he is bleeding from this wound.  Periwound: intact  Dressing procedure/placement/frequency: Silver hydrofiber for topical therapy on the right and left wounds due to the amount of exudate and for the antimicrobial coverage. Continue 4 layer compression therapy 2x wk. As ordered by orthopedics.   WOC team will follow along with you for weekly wound assessments and 2x wk Profore compression wrap changes. Tues/Fridays. Please notify me of any acute changes in the wounds or any new areas of concerns Armen PickupMelody Aydien Majette RN,CWOCN [phone]

## 2015-02-26 NOTE — Progress Notes (Signed)
eLink Physician-Brief Progress Note Patient Name: Bryce MountJoseph P [name] DOB: 07/28/1946 MRN: [id number]   Date of Service  02/26/2015  HPI/Events of Note  Phosphorous = 2.1, K+ = 4.5, Creatinine = 1.22 and Na+ = 136.  eICU Interventions  Will replace Phosphorous with Sodium Phosphate.     Intervention Category Intermediate Interventions: Electrolyte abnormality - evaluation and management  Sommer,Steven Eugene 02/26/2015, 5:56 AM

## 2015-02-26 NOTE — Progress Notes (Signed)
Pt restless, agitated, grabbing at tube. Fentanyl gtt started per order.  Multiple titrations made thus far, to keep pt at rass goal, unable to successfully keep pt calm.

## 2015-02-26 NOTE — Progress Notes (Signed)
Subjective: Interval History: has no complaint, more alert squeezes hands.  Objective: Vital signs in last 24 hours: Temp:  [93.5 F (34.2 C)-97.7 F (36.5 C)] 97.7 F (36.5 C) (03/18 0800) Pulse Rate:  [44-102] 44 (03/17 1606) Resp:  [11-25] 20 (03/18 0800) BP: (92-163)/(61-70) 110/70 mmHg (03/17 1606) SpO2:  [92 %-100 %] 100 % (03/18 0800) Arterial Line BP: (72-176)/(45-108) 154/88 mmHg (03/18 0800) FiO2 (%):  [30 %-40 %] 30 % (03/18 0800) Weight:  [103.3 kg (227 lb 11.8 oz)] 103.3 kg (227 lb 11.8 oz) (03/18 0440) Weight change: -1.5 kg (-3 lb 4.9 oz)  Intake/Output from previous day: 03/17 0701 - 03/18 0700 In: 2253.9 [I.V.:540.6; NG/GT:1460; IV Piggyback:253.3] Out: 2724 [Stool:50] Intake/Output this shift: Total I/O In: 72.8 [I.V.:27.8; NG/GT:45] Out: 134 [Other:134]  General appearance: aggitated,but squeezes hands Neck: RIJ cath Resp: rales bilaterally and rhonchi bilaterally Cardio: S1, S2 normal and systolic murmur: holosystolic 2/6, blowing at apex GI: obese,pos bs, liver down 4 cm Extremities: edema 2+,  and Unna boots  Lab Results:  Recent Labs  02/25/15 0330 02/26/15 0420  WBC 25.9* 23.5*  HGB 14.3 14.1  HCT 46.0 43.8  PLT 141* 144*   BMET:  Recent Labs  02/25/15 1600 02/26/15 0420  NA 132* 136  K 4.4 4.5  CL 100 103  CO2 19 28  GLUCOSE 230* 177*  BUN 45* 45*  CREATININE 1.33 1.22  CALCIUM 8.3* 8.7    Recent Labs  02/25/15 0929  PTH 211*   Iron Studies: No results for input(s): IRON, TIBC, TRANSFERRIN, FERRITIN in the last 72 hours.  Studies/Results: Dg Chest Port 1 View  02/26/2015   CLINICAL DATA:  Respiratory failure.  EXAM: PORTABLE CHEST - 1 VIEW  COMPARISON:  02/25/2015  FINDINGS: Endotracheal tube tip is 3 cm above the carina. Nasogastric tube extends into the stomach. Right jugular central line extends into the SVC. Left jugular central line extends into the SVC. There is cardiomegaly, unchanged. Dense consolidation in the left  base is unchanged. Ground-glass opacities in the central regions are unchanged.  IMPRESSION: Support equipment appears satisfactorily positioned. No interval change in the airspace opacities or left base consolidation.   Electronically Signed   By: Ellery Plunkaniel R Mitchell M.D.   On: 02/26/2015 06:10   Dg Chest Port 1 View  02/25/2015   CLINICAL DATA:  Respiratory failure  EXAM: PORTABLE CHEST - 1 VIEW  COMPARISON:  02/24/2015  FINDINGS: Endotracheal tube tip is 2 cm above the carina. Nasogastric tube extends below the diaphragm and off the inferior edge of the image. There is a right jugular central line extending into the cavoatrial junction region. There is a left jugular central line extending into the SVC. There is cardiomegaly, unchanged. There is consolidation in the left base and ground-glass opacity in the right base, unchanged.  IMPRESSION: Support equipment appears satisfactorily positioned. No interval change in the basilar opacities.   Electronically Signed   By: Ellery Plunkaniel R Mitchell M.D.   On: 02/25/2015 06:50    I have reviewed the patient's current medications.  Assessment/Plan: 1 AKI oliguric ATN . More stable hemodynamics.  Low dose pressors.  Will try to get more vol off with better bp and hopefully facilitate wean.  Acid/base/K ok . Good solute removal 2 Anemia stable 3 Sepsis on AB , more stable 4 Obesity 5 DM controlled 6 Malnutrit on TF 7 VDRF per CCM 8 Enceph improving 9 Cellulitis wound care P CRRT, lower vol, Vent, AB, wound care, TF  LOS: 11 days   Jonique Kulig L 02/26/2015,8:24 AM

## 2015-02-27 ENCOUNTER — Inpatient Hospital Stay (HOSPITAL_COMMUNITY): Payer: Commercial Managed Care - HMO

## 2015-02-27 LAB — POCT ACTIVATED CLOTTING TIME
ACTIVATED CLOTTING TIME: 165 s
ACTIVATED CLOTTING TIME: 196 s
ACTIVATED CLOTTING TIME: 202 s
Activated Clotting Time: 208 seconds
Activated Clotting Time: 196 seconds
Activated Clotting Time: 202 seconds
Activated Clotting Time: 208 seconds

## 2015-02-27 LAB — BASIC METABOLIC PANEL
Anion gap: 6 (ref 5–15)
BUN: 40 mg/dL — ABNORMAL HIGH (ref 6–23)
CO2: 28 mmol/L (ref 19–32)
Calcium: 8.7 mg/dL (ref 8.4–10.5)
Chloride: 100 mmol/L (ref 96–112)
Creatinine, Ser: 1.3 mg/dL (ref 0.50–1.35)
GFR calc Af Amer: 64 mL/min — ABNORMAL LOW (ref >90)
GFR, EST NON AFRICAN AMERICAN: 55 mL/min — AB (ref >90)
GLUCOSE: 199 mg/dL — AB (ref 70–99)
Potassium: 4.5 mmol/L (ref 3.5–5.1)
Sodium: 134 mmol/L — ABNORMAL LOW (ref 135–145)

## 2015-02-27 LAB — MAGNESIUM: Magnesium: 2.2 mg/dL (ref 1.5–2.5)

## 2015-02-27 LAB — GLUCOSE, CAPILLARY
GLUCOSE-CAPILLARY: 195 mg/dL — AB (ref 70–99)
GLUCOSE-CAPILLARY: 184 mg/dL — AB (ref 70–99)
GLUCOSE-CAPILLARY: 224 mg/dL — AB (ref 70–99)
GLUCOSE-CAPILLARY: 202 mg/dL — AB (ref 70–99)
Glucose-Capillary: 249 mg/dL — ABNORMAL HIGH (ref 70–99)
Glucose-Capillary: 262 mg/dL — ABNORMAL HIGH (ref 70–99)
Glucose-Capillary: 212 mg/dL — ABNORMAL HIGH (ref 70–99)
Glucose-Capillary: 151 mg/dL — ABNORMAL HIGH (ref 70–99)

## 2015-02-27 LAB — CBC
HEMATOCRIT: 45.3 % (ref 39.0–52.0)
HEMOGLOBIN: 14.3 g/dL (ref 13.0–17.0)
MCH: 28.9 pg (ref 26.0–34.0)
MCHC: 31.6 g/dL (ref 30.0–36.0)
MCV: 91.5 fL (ref 78.0–100.0)
PLATELETS: 152 10*3/uL (ref 150–400)
RBC: 4.95 MIL/uL (ref 4.22–5.81)
RDW: 18.9 % — AB (ref 11.5–15.5)
WBC: 23.7 10*3/uL — ABNORMAL HIGH (ref 4.0–10.5)

## 2015-02-27 LAB — BLOOD GAS, ARTERIAL
ACID-BASE DEFICIT: 0.8 mmol/L (ref 0.0–2.0)
BICARBONATE: 22.8 meq/L (ref 20.0–24.0)
DRAWN BY: 235321
FIO2: 0.3 %
MECHVT: 540 mL
O2 Saturation: 95 %
PATIENT TEMPERATURE: 98.6
PEEP/CPAP: 5 cmH2O
PH ART: 7.441 (ref 7.350–7.450)
RATE: 25 resp/min
TCO2: 23.9 mmol/L (ref 0–100)
pCO2 arterial: 34.1 mmHg — ABNORMAL LOW (ref 35.0–45.0)
pO2, Arterial: 69 mmHg — ABNORMAL LOW (ref 80.0–100.0)

## 2015-02-27 LAB — ALBUMIN: ALBUMIN: 1.9 g/dL — AB (ref 3.5–5.2)

## 2015-02-27 LAB — RENAL FUNCTION PANEL
ANION GAP: 12 (ref 5–15)
Albumin: 1.9 g/dL — ABNORMAL LOW (ref 3.5–5.2)
BUN: 43 mg/dL — AB (ref 6–23)
CO2: 23 mmol/L (ref 19–32)
Calcium: 8.4 mg/dL (ref 8.4–10.5)
Chloride: 99 mmol/L (ref 96–112)
Creatinine, Ser: 1.4 mg/dL — ABNORMAL HIGH (ref 0.50–1.35)
GFR calc Af Amer: 59 mL/min — ABNORMAL LOW (ref >90)
GFR calc non Af Amer: 50 mL/min — ABNORMAL LOW (ref >90)
Glucose, Bld: 220 mg/dL — ABNORMAL HIGH (ref 70–99)
PHOSPHORUS: 4.5 mg/dL (ref 2.3–4.6)
POTASSIUM: 4.4 mmol/L (ref 3.5–5.1)
Sodium: 134 mmol/L — ABNORMAL LOW (ref 135–145)

## 2015-02-27 LAB — PHOSPHORUS: Phosphorus: 1.9 mg/dL — ABNORMAL LOW (ref 2.3–4.6)

## 2015-02-27 LAB — APTT: APTT: 60 s — AB (ref 24–37)

## 2015-02-27 MED ORDER — SODIUM PHOSPHATE 3 MMOLE/ML IV SOLN
30.0000 mmol | Freq: Once | INTRAVENOUS | Status: AC
  Administered 2015-02-27: 30 mmol via INTRAVENOUS
  Filled 2015-02-27: qty 10

## 2015-02-27 NOTE — Progress Notes (Signed)
Subjective: Interval History: has no complaint ,opens eyes and squeezes hand.  Objective: Vital signs in last 24 hours: Temp:  [97.4 F (36.3 C)-98.3 F (36.8 C)] 97.4 F (36.3 C) (03/19 0400) Resp:  [10-26] 25 (03/19 0700) SpO2:  [93 %-100 %] 100 % (03/19 0700) Arterial Line BP: (84-187)/(51-113) 98/54 mmHg (03/19 0700) FiO2 (%):  [30 %] 30 % (03/19 0400) Weight:  [102.3 kg (225 lb 8.5 oz)] 102.3 kg (225 lb 8.5 oz) (03/19 0357) Weight change: -1 kg (-2 lb 3.3 oz)  Intake/Output from previous day: 03/18 0701 - 03/19 0700 In: 2447.5 [I.V.:983.8; NG/GT:1350; IV Piggyback:103.8] Out: 4607  Intake/Output this shift:    General appearance: cooperative and sleepy but arouses Neck: RIJ cath Resp: diminished breath sounds bilaterally, rales bibasilar and rhonchi bibasilar Cardio: S1, S2 normal and systolic murmur: holosystolic 2/6, blowing at apex GI: pos bs, liver down 5 cm, obese soft, abdm wall edema Extremities: edema 2+ and wound boots on calves  Lab Results:  Recent Labs  02/26/15 0420 02/27/15 0345  WBC 23.5* 23.7*  HGB 14.1 14.3  HCT 43.8 45.3  PLT 144* 152   BMET:  Recent Labs  02/26/15 1556 02/27/15 0345  NA 134* 134*  K 4.5 4.5  CL 101 100  CO2 25 28  GLUCOSE 236* 199*  BUN 45* 40*  CREATININE 1.35 1.30  CALCIUM 8.5 8.7    Recent Labs  02/25/15 0929  PTH 211*   Iron Studies: No results for input(s): IRON, TIBC, TRANSFERRIN, FERRITIN in the last 72 hours.  Studies/Results: Dg Chest Port 1 View  02/26/2015   CLINICAL DATA:  Respiratory failure.  EXAM: PORTABLE CHEST - 1 VIEW  COMPARISON:  02/25/2015  FINDINGS: Endotracheal tube tip is 3 cm above the carina. Nasogastric tube extends into the stomach. Right jugular central line extends into the SVC. Left jugular central line extends into the SVC. There is cardiomegaly, unchanged. Dense consolidation in the left base is unchanged. Ground-glass opacities in the central regions are unchanged.  IMPRESSION:  Support equipment appears satisfactorily positioned. No interval change in the airspace opacities or left base consolidation.   Electronically Signed   By: Ellery Plunkaniel R Mitchell M.D.   On: 02/26/2015 06:10    I have reviewed the patient's current medications.  Assessment/Plan: 1 AKI vol xs but improving. CXR clearing.  bp fair.  Acid/base/K ok, good solute clearance 2 Sepsis on Ceftriaxone 3 VDRF per CCM 4 Obesity complicate 5 DM controlled 6 Malnutrit on TF 7 Cellulitis wound care and AB P CRRT, vol off, vent per CCM, TF, AB, wound care    LOS: 12 days   Kirstein Baxley L 02/27/2015,7:57 AM

## 2015-02-27 NOTE — Progress Notes (Signed)
PULMONARY / CRITICAL CARE MEDICINE   Name: Bryce [name] MRN: [id number] DOB: 01-Oct-1946    ADMISSION DATE:  03/05/2015 CONSULTATION DATE:  3/8  REFERRING MD : [facility]ad  CHIEF COMPLAINT:  AMS  INITIAL PRESENTATION: 69 y/o M w/ PMHx of HTN, HLD, DM type II, chronic dCHF, BPH, and RBBB, admitted w/ purulent LE cellulitis, and oliguric AKI (Cr >6).  PCCM consulted for acute hypoxic respiratory failure and septic shock.   STUDIES:  3/8 [place] RUQ>>>No cholelithiasis or sonographic evidence of acute cholecystitis. 2. Heterogeneous hepatic echogenicity as can be seen with hepatocellular disease. 3/9 CT lower ext>>>soft tissue edema , no drainable fluid collection.  SIGNIFICANT EVENTS: 3/8 >> Transfer to ICU, shock, acidotic, intubation, pressors 3/8 - gram neg in blood, cvvhd 3/9 - CT done legs, continued high pressors  SUBJECTIVE:  Tolerated some PSV for 6 hours 3/18 Nodded to questions this am Was agitated when sedation cut in half   VITAL SIGNS: Temp:  [97.4 F (36.3 C)-98.3 F (36.8 C)] 98 F (36.7 C) (03/19 0800) Pulse Rate:  [59] 59 (03/19 0900) Resp:  [10-26] 17 (03/19 1200) SpO2:  [93 %-100 %] 100 % (03/19 1200) Arterial Line BP: (84-187)/(51-113) 111/63 mmHg (03/19 1200) FiO2 (%):  [30 %] 30 % (03/19 1200) Weight:  [102.3 kg (225 lb 8.5 oz)] 102.3 kg (225 lb 8.5 oz) (03/19 0357)   HEMODYNAMICS:     VENTILATOR SETTINGS: Vent Mode:  [-] PRVC FiO2 (%):  [30 %] 30 % Set Rate:  [25 bmp] 25 bmp Vt Set:  [540 mL] 540 mL PEEP:  [5 cmH20] 5 cmH20 Plateau Pressure:  [11 cmH20-28 cmH20] 21 cmH20   INTAKE / OUTPUT:  Intake/Output Summary (Last 24 hours) at 02/27/15 1209 Last data filed at 02/27/15 1200  Gross per 24 hour  Intake 2707.73 ml  Output   4772 ml  Net -2064.27 ml   PHYSICAL EXAMINATION: General:  Obese AAM, calm (on sedation) and following some commands Neuro:  RASS -1, nodded on command, opens eyes to stimulation, blinks to threat, pupils are  reactive. HEENT: ETT in place, OP moist Cardiovascular:  RRR distant. Lungs:  Coarse BS diffusely. Abdomen:  obese +bs, no r/g Musculoskeletal:  intact Skin:  Lower ext cellulitis, wrapped   LABS:  CBC  Recent Labs Lab 02/25/15 0330 02/26/15 0420 02/27/15 0345  WBC 25.9* 23.5* 23.7*  HGB 14.3 14.1 14.3  HCT 46.0 43.8 45.3  PLT 141* 144* 152   BMET  Recent Labs Lab 02/26/15 0420 02/26/15 1556 02/27/15 0345  NA 136 134* 134*  K 4.5 4.5 4.5  CL 103 101 100  CO2 BUN 45* 45* 40*  CREATININE 1.22 1.35 1.30  GLUCOSE 177* 236* 199*   Electrolytes  Recent Labs Lab 02/25/15 0330  02/26/15 0420 02/26/15 1556 02/27/15 0345  CALCIUM 8.9  < > 8.7 8.5 8.7  MG 2.3  --  2.3  --  2.2  PHOS 2.9  < > 2.1* 2.7 1.9*  < > = values in this interval not displayed. Sepsis Markers No results for input(s): LATICACIDVEN, PROCALCITON, O2SATVEN in the last 168 hours. ABG  Recent Labs Lab 02/25/15 0454 02/26/15 0320 02/27/15 0317  PHART 7.304* 7.406 7.441  PCO2ART 48.6* 37.1 34.1*  PO2ART 119.0* 113.0* 69.0*   Liver Enzymes  Recent Labs Lab 02/23/15 0400  02/25/15 0330  02/26/15 0420 02/26/15 1556 02/27/15 0345  AST 60*  --  50*  --   --   --   --  ALT 75*  --  56*  --   --   --   --   ALKPHOS 204*  --  198*  --   --   --   --   BILITOT 6.9*  --  4.6*  --   --   --   --   ALBUMIN 1.7*  < > 1.9*  < > 1.9* 2.0* 1.9*  < > = values in this interval not displayed. Cardiac Enzymes No results for input(s): TROPONINI, PROBNP in the last 168 hours. Glucose  Recent Labs Lab 02/26/15 1130 02/26/15 1616 02/26/15 1924 02/26/15 2340 02/27/15 0336 02/27/15 0723  GLUCAP 205* 195* 262* 249* 184* 212*   Imaging Dg Chest Port 1 View  02/26/2015   CLINICAL DATA:  Respiratory failure.  EXAM: PORTABLE CHEST - 1 VIEW  COMPARISON:  02/25/2015  FINDINGS: Endotracheal tube tip is 3 cm above the carina. Nasogastric tube extends into the stomach. Right jugular central line  extends into the SVC. Left jugular central line extends into the SVC. There is cardiomegaly, unchanged. Dense consolidation in the left base is unchanged. Ground-glass opacities in the central regions are unchanged.  IMPRESSION: Support equipment appears satisfactorily positioned. No interval change in the airspace opacities or left base consolidation.   Electronically Signed   By: Ellery Plunkaniel R Mitchell M.D.   On: 02/26/2015 06:10   ASSESSMENT / PLAN:  PULMONARY OETT 3/8 >> A: Acute Hypoxic Respiratory Failure Pulmonary Edema improved   P:   PS trials but no extubation at this time. MS and volume status precluding factors. Family does not wish for trach/peg, if not extubatable after the weekend then they are ok with withdrawal of care. Titrate O2 for sat of 88-92%. VAP bundle.  CARDIOVASCULAR LIJ CVL 3/8 >> RIJ Temp HD Cath 3/8 >> Right Femoral A-line 3/8 >> A: Septic Shock Chronic dCHF Severe pulm htn Echo  3/10 >> RVSP 62, dilated RV P:  Levophed off, becomes very hypertensive when sedation lifted Levophed as needed > sedation related steroids steroids Map goal 60-65.  RENAL A:   Acute Renal Failure/ATN; likely 2/2 sepsis  Anuric on CRRT Hyponatremia AG Metabolic Acidosis 2/2 acute renal failure/lactic acidosis P:   CRRT per renal -75 ml/hr, hopefully will help with weaning efforts. BMET in AM. Replace electrolytes as indicated.  GASTROINTESTINAL A:   Obesity GI PPx Protein calorie mal nutrition P:   PPI TF to goal  HEMATOLOGIC A:   DVT PPx P:  Heparin [place] CBC  INFECTIOUS A:   Purulent LE Cellulitis Sepsis/septic Shock Gram neg septic shock P:   BCx2  3/7>>morganella morgani UC  3/8>>ng C diff neg 3/11   Abx: 3/7 vanc>>3/9 3/7 zoysn>>3/9 3/8 Imipenem>>>3/16 3/16 rocephin>>>  Wound care wrapping B LE's  ENDOCRINE A:  DM P:   SSI. No evidence rel AI but will use steroids since pressor dependent.  NEUROLOGIC A:   Lethargic P:   RASS  goal: 0. Fent drip, WUA Precedex drip.  FAMILY  - Updates: No family bedside on 3/18  Independent CC time 35 minutes  Levy Pupaobert Dorismar Chay, MD, PhD 02/27/2015, 12:22 PM [facility] [phone] or if no answer [phone]

## 2015-02-27 NOTE — Progress Notes (Signed)
eLink Physician-Brief Progress Note Patient Name: Bryce MountJoseph P [name] DOB: 10/31/1946 MRN: [id number]   Date of Service  02/27/2015  HPI/Events of Note  hypophosphatemia  eICU Interventions  Phos replaced     Intervention Category Intermediate Interventions: Electrolyte abnormality - evaluation and management  DETERDING,ELIZABETH 02/27/2015, 4:44 AM

## 2015-02-28 LAB — CBC
HCT: 48.6 % (ref 39.0–52.0)
Hemoglobin: 15.1 g/dL (ref 13.0–17.0)
MCH: 28.7 pg (ref 26.0–34.0)
MCHC: 31.1 g/dL (ref 30.0–36.0)
MCV: 92.2 fL (ref 78.0–100.0)
Platelets: 166 10*3/uL (ref 150–400)
RBC: 5.27 MIL/uL (ref 4.22–5.81)
RDW: 19.2 % — ABNORMAL HIGH (ref 11.5–15.5)
WBC: 22.7 10*3/uL — ABNORMAL HIGH (ref 4.0–10.5)

## 2015-02-28 LAB — GLUCOSE, CAPILLARY
GLUCOSE-CAPILLARY: 199 mg/dL — AB (ref 70–99)
GLUCOSE-CAPILLARY: 217 mg/dL — AB (ref 70–99)
GLUCOSE-CAPILLARY: 219 mg/dL — AB (ref 70–99)
GLUCOSE-CAPILLARY: 241 mg/dL — AB (ref 70–99)
GLUCOSE-CAPILLARY: 250 mg/dL — AB (ref 70–99)
Glucose-Capillary: 277 mg/dL — ABNORMAL HIGH (ref 70–99)
Glucose-Capillary: 200 mg/dL — ABNORMAL HIGH (ref 70–99)

## 2015-02-28 LAB — RENAL FUNCTION PANEL
ALBUMIN: 2.1 g/dL — AB (ref 3.5–5.2)
ANION GAP: 9 (ref 5–15)
BUN: 41 mg/dL — ABNORMAL HIGH (ref 6–23)
CO2: 24 mmol/L (ref 19–32)
Calcium: 9.1 mg/dL (ref 8.4–10.5)
Chloride: 101 mmol/L (ref 96–112)
Creatinine, Ser: 1.37 mg/dL — ABNORMAL HIGH (ref 0.50–1.35)
GFR calc non Af Amer: 52 mL/min — ABNORMAL LOW (ref >90)
GFR, EST AFRICAN AMERICAN: 60 mL/min — AB (ref >90)
Glucose, Bld: 227 mg/dL — ABNORMAL HIGH (ref 70–99)
PHOSPHORUS: 3.5 mg/dL (ref 2.3–4.6)
POTASSIUM: 4.6 mmol/L (ref 3.5–5.1)
SODIUM: 134 mmol/L — AB (ref 135–145)

## 2015-02-28 LAB — BASIC METABOLIC PANEL
Anion gap: 7 (ref 5–15)
BUN: 41 mg/dL — AB (ref 6–23)
CALCIUM: 8.6 mg/dL (ref 8.4–10.5)
CO2: 25 mmol/L (ref 19–32)
Chloride: 101 mmol/L (ref 96–112)
Creatinine, Ser: 1.36 mg/dL — ABNORMAL HIGH (ref 0.50–1.35)
GFR calc Af Amer: 61 mL/min — ABNORMAL LOW (ref >90)
GFR calc non Af Amer: 52 mL/min — ABNORMAL LOW (ref >90)
Glucose, Bld: 215 mg/dL — ABNORMAL HIGH (ref 70–99)
Potassium: 4.6 mmol/L (ref 3.5–5.1)
Sodium: 133 mmol/L — ABNORMAL LOW (ref 135–145)

## 2015-02-28 LAB — APTT: aPTT: 84 seconds — ABNORMAL HIGH (ref 24–37)

## 2015-02-28 LAB — POCT ACTIVATED CLOTTING TIME
ACTIVATED CLOTTING TIME: 171 s
ACTIVATED CLOTTING TIME: 171 s
ACTIVATED CLOTTING TIME: 183 s
ACTIVATED CLOTTING TIME: 196 s
ACTIVATED CLOTTING TIME: 208 s
ACTIVATED CLOTTING TIME: 214 s
ACTIVATED CLOTTING TIME: 214 s
ACTIVATED CLOTTING TIME: 221 s
ACTIVATED CLOTTING TIME: 227 s
ACTIVATED CLOTTING TIME: 227 s
ACTIVATED CLOTTING TIME: 239 s
Activated Clotting Time: 178 seconds
Activated Clotting Time: 178 seconds
Activated Clotting Time: 177 seconds
Activated Clotting Time: 171 seconds
Activated Clotting Time: 227 seconds
Activated Clotting Time: 208 seconds
Activated Clotting Time: 220 seconds

## 2015-02-28 LAB — PHOSPHORUS: Phosphorus: 3.3 mg/dL (ref 2.3–4.6)

## 2015-02-28 LAB — MAGNESIUM: Magnesium: 2.4 mg/dL (ref 1.5–2.5)

## 2015-02-28 NOTE — Progress Notes (Signed)
Subjective: Interval History: has no complaint, not cognizant.  Objective: Vital signs in last 24 hours: Temp:  [97.3 F (36.3 C)-98.3 F (36.8 C)] 97.3 F (36.3 C) (03/20 0400) Pulse Rate:  [59-81] 70 (03/20 0447) Resp:  [14-26] 20 (03/20 0700) BP: (157)/(90) 157/90 mmHg (03/19 1700) SpO2:  [80 %-100 %] 94 % (03/20 0700) Arterial Line BP: (81-179)/(52-101) 148/81 mmHg (03/20 0700) FiO2 (%):  [30 %] 30 % (03/20 0600) Weight:  [99.8 kg (220 lb 0.3 oz)] 99.8 kg (220 lb 0.3 oz) (03/20 0200) Weight change: -2.5 kg (-5 lb 8.2 oz)  Intake/Output from previous day: 03/19 0701 - 03/20 0700 In: 2980.3 [I.V.:1618.3; NG/GT:1140; IV Piggyback:222] Out: 4949  Intake/Output this shift:    General appearance: moderately obese and sedated, only stares Neck: RIJ cath Resp: diminished breath sounds bilaterally, rales bibasilar and rhonchi bibasilar Cardio: S1, S2 normal and systolic murmur: holosystolic 2/6, blowing at apex GI: obese,pos bs, soft, liver down 6 cm Extremities: edema 2+ and dressings on legs  Lab Results:  Recent Labs  02/27/15 0345 02/28/15 0430  WBC 23.7* 22.7*  HGB 14.3 15.1  HCT 45.3 48.6  PLT 152 166  IJ cat BMET:  Recent Labs  02/27/15 1950 02/28/15 0430  NA 134* 133*  K 4.4 4.6  CL 99 101  CO2 23 25  GLUCOSE 220* 215*  BUN 43* 41*  CREATININE 1.40* 1.36*  CALCIUM 8.4 8.6    Recent Labs  02/25/15 0929  PTH 211*   Iron Studies: No results for input(s): IRON, TIBC, TRANSFERRIN, FERRITIN in the last 72 hours.  Studies/Results: Dg Chest Port 1 View  02/27/2015   CLINICAL DATA:  Endotracheal tube  EXAM: PORTABLE CHEST - 1 VIEW  COMPARISON:  02/26/2015  FINDINGS: Support devices unchanged including endotracheal tube which is seen with tip about 1.5 cm above the carina.  Stable cardiac enlargement. Central vascular congestion with mild bilateral perihilar hazy attenuation. Improved retrocardiac aeration.  IMPRESSION: Bilateral stable perihilar opacities.  Improved left lower lobe aeration.   Electronically Signed   By: Esperanza Heiraymond  Rubner M.D.   On: 02/27/2015 08:46    I have reviewed the patient's current medications.  Assessment/Plan: 1 AKI/CKD vol xs on exam and CXR,  tol removal.  Acid/base/k ok.  Good solute removal.  Will cont to take vol as bp ok and will facilitate wean 2 Shock resolved not clear why on  Pressor 3 VDRF per CCM 4 Cellulitis on Ceftriaxone, wound care 5 Sepsis 7 Obesity 8 DM 8 Nutrition on TF P CRRT, vol off, AB, TF, vent,     LOS: 13 days   Nillie Bartolotta L 02/28/2015,7:54 AM

## 2015-02-28 NOTE — Progress Notes (Signed)
PULMONARY / CRITICAL CARE MEDICINE   Name: Bryce [name] MRN: [id number] DOB: 12-Sep-1946    ADMISSION DATE:  03/08/2015 CONSULTATION DATE:  3/8  REFERRING MD : [facility]ad  CHIEF COMPLAINT:  AMS  INITIAL PRESENTATION: 69 y/o M w/ PMHx of HTN, HLD, DM type II, chronic dCHF, BPH, and RBBB, admitted w/ purulent LE cellulitis, and oliguric AKI (Cr >6).  PCCM consulted for acute hypoxic respiratory failure and septic shock.   STUDIES:  3/8 [place] RUQ>>>No cholelithiasis or sonographic evidence of acute cholecystitis. 2. Heterogeneous hepatic echogenicity as can be seen with hepatocellular disease. 3/9 CT lower ext>>>soft tissue edema , no drainable fluid collection.  SIGNIFICANT EVENTS: 3/8 >> Transfer to ICU, shock, acidotic, intubation, pressors 3/8 - gram neg in blood, cvvhd 3/9 - CT done legs, continued high pressors  SUBJECTIVE:  Balancing sedation with agitation and ability to wean has been difficult > was too sedated for PSV this am, planning to retry   VITAL SIGNS: Temp:  [96 F (35.6 C)-98.3 F (36.8 C)] 96 F (35.6 C) (03/20 1200) Pulse Rate:  [67-125] 125 (03/20 1215) Resp:  [12-27] 12 (03/20 1215) BP: (137-188)/(81-105) 188/105 mmHg (03/20 1215) SpO2:  [73 %-100 %] 96 % (03/20 1215) Arterial Line BP: (66-188)/(49-119) 156/87 mmHg (03/20 1200) FiO2 (%):  [30 %] 30 % (03/20 1215) Weight:  [99.8 kg (220 lb 0.3 oz)] 99.8 kg (220 lb 0.3 oz) (03/20 0200)   HEMODYNAMICS:     VENTILATOR SETTINGS: Vent Mode:  [-] PSV;CPAP FiO2 (%):  [30 %] 30 % Set Rate:  [25 bmp] 25 bmp Vt Set:  [540 mL] 540 mL PEEP:  [5 cmH20] 5 cmH20 Pressure Support:  [5 cmH20] 5 cmH20 Plateau Pressure:  [17 cmH20-21 cmH20] 21 cmH20   INTAKE / OUTPUT:  Intake/Output Summary (Last 24 hours) at 02/28/15 1252 Last data filed at 02/28/15 1200  Gross per 24 hour  Intake 2950.8 ml  Output   4860 ml  Net -1909.2 ml   PHYSICAL EXAMINATION: General:  Obese AAM, calm (on sedation) and following some  commands Neuro:  RASS -1, nodded on command, opens eyes to stimulation  HEENT: ETT in place, OP moist Cardiovascular:  RRR distant. Lungs:  Coarse BS diffusely. Abdomen:  obese +bs, no r/g Musculoskeletal:  intact Skin:  Lower ext cellulitis, wrapped   LABS:  CBC  Recent Labs Lab 02/26/15 0420 02/27/15 0345 02/28/15 0430  WBC 23.5* 23.7* 22.7*  HGB 14.1 14.3 15.1  HCT 43.8 45.3 48.6  PLT 144* 152 166   BMET  Recent Labs Lab 02/27/15 0345 02/27/15 1950 02/28/15 0430  NA 134* 134* 133*  K 4.5 4.4 4.6  CL 100 99 101  CO2 _0 BUN 40* 43* 41*  CREATININE 1.30 1.40* 1.36*  GLUCOSE 199* 220* 215*   Electrolytes  Recent Labs Lab 02/26/15 0420  02/27/15 0345 02/27/15 1950 02/28/15 0430  CALCIUM 8.7  < > 8.7 8.4 8.6  MG 2.3  --  2.2  --  2.4  PHOS 2.1*  < > 1.9* 4.5 3.3  < > = values in this interval not displayed. Sepsis Markers No results for input(s): LATICACIDVEN, PROCALCITON, O2SATVEN in the last 168 hours. ABG  Recent Labs Lab 02/25/15 0454 02/26/15 0320 02/27/15 0317  PHART 7.304* 7.406 7.441  PCO2ART 48.6* 37.1 34.1*  PO2ART 119.0* 113.0* 69.0*   Liver Enzymes  Recent Labs Lab 02/23/15 0400  02/25/15 0330  02/26/15 1556 02/27/15 0345 02/27/15 1950  AST 60*  --  50*  --   --   --   --   ALT 75*  --  56*  --   --   --   --   ALKPHOS 204*  --  198*  --   --   --   --   BILITOT 6.9*  --  4.6*  --   --   --   --   ALBUMIN 1.7*  < > 1.9*  < > 2.0* 1.9* 1.9*  < > = values in this interval not displayed. Cardiac Enzymes No results for input(s): TROPONINI, PROBNP in the last 168 hours. Glucose  Recent Labs Lab 02/27/15 1328 02/27/15 1556 02/27/15 1940 02/28/15 0016 02/28/15 0430 02/28/15 0745  GLUCAP 224* 202* 151* 241* 199* 219*   Imaging Dg Chest Port 1 View  02/27/2015   CLINICAL DATA:  Endotracheal tube  EXAM: PORTABLE CHEST - 1 VIEW  COMPARISON:  02/26/2015  FINDINGS: Support devices unchanged including endotracheal tube  which is seen with tip about 1.5 cm above the carina.  Stable cardiac enlargement. Central vascular congestion with mild bilateral perihilar hazy attenuation. Improved retrocardiac aeration.  IMPRESSION: Bilateral stable perihilar opacities. Improved left lower lobe aeration.   Electronically Signed   By: Skipper Cliche M.D.   On: 02/27/2015 08:46   ASSESSMENT / PLAN:  PULMONARY OETT 3/8 >> A: Acute Hypoxic Respiratory Failure Pulmonary Edema improved   P:   PS trials but has not met criteria for extubation yet. MS, sedation needs and volume status precluding factors. Family does not wish for trach/peg, if not extubatable after the weekend then they would like to discuss withdrawal of care. Titrate O2 for sat of 88-92%. VAP bundle.  CARDIOVASCULAR LIJ CVL 3/8 >> RIJ Temp HD Cath 3/8 >> Right Femoral A-line 3/8 >> A: Septic Shock Chronic dCHF Severe pulm htn Echo  3/10 >> RVSP 62, dilated RV P:  Levophed off, becomes very hypertensive when sedation lifted Levophed as needed > sedation related steroids steroids Map goal 60-65.  RENAL A:   Acute Renal Failure/ATN; likely 2/2 sepsis,  on CRRT Hyponatremia AG Metabolic Acidosis 2/2 acute renal failure/lactic acidosis P:   CRRT per renal -75 ml/hr, hopefully will help with weaning efforts. Follow BMET  Replace electrolytes as indicated.  GASTROINTESTINAL A:   Obesity GI PPx Protein calorie mal nutrition P:   PPI TF to goal  HEMATOLOGIC A:   DVT PPx P:  Heparin [place] CBC  INFECTIOUS A:   Purulent LE Cellulitis Sepsis/septic Shock Gram neg septic shock P:   BCx2  3/7>>morganella morgani UC  3/8>>ng C diff neg 3/11   Abx: 3/7 vanc>>3/9 3/7 zoysn>>3/9 3/8 Imipenem>>>3/16 3/16 rocephin>>>  Wound care wrapping B LE's  ENDOCRINE A:  DM P:   SSI. No evidence rel AI but will use steroids since pressor dependent.  NEUROLOGIC A:   Intermittent agitation P:   RASS goal: 0. Fent drip, WUA Precedex  drip.  FAMILY  - Updates: discussed with family 3/19, none present 3/20   Independent CC time 35 minutes  Baltazar Apo, MD, PhD 02/28/2015, 12:52 PM [facility] [phone] or if no answer [phone]

## 2015-03-01 DIAGNOSIS — A419 Sepsis, unspecified organism: Secondary | ICD-10-CM

## 2015-03-01 DIAGNOSIS — IMO0001 Reserved for inherently not codable concepts without codable children: Secondary | ICD-10-CM | POA: Insufficient documentation

## 2015-03-01 DIAGNOSIS — R6521 Severe sepsis with septic shock: Secondary | ICD-10-CM

## 2015-03-01 DIAGNOSIS — N179 Acute kidney failure, unspecified: Secondary | ICD-10-CM | POA: Insufficient documentation

## 2015-03-01 DIAGNOSIS — J9601 Acute respiratory failure with hypoxia: Secondary | ICD-10-CM

## 2015-03-01 DIAGNOSIS — Z66 Do not resuscitate: Secondary | ICD-10-CM

## 2015-03-01 DIAGNOSIS — Z515 Encounter for palliative care: Secondary | ICD-10-CM

## 2015-03-01 LAB — POCT ACTIVATED CLOTTING TIME
ACTIVATED CLOTTING TIME: 226 s
ACTIVATED CLOTTING TIME: 202 s
ACTIVATED CLOTTING TIME: 257 s
ACTIVATED CLOTTING TIME: 196 s
Activated Clotting Time: 177 seconds
Activated Clotting Time: 190 seconds
Activated Clotting Time: 202 seconds
Activated Clotting Time: 202 seconds
Activated Clotting Time: 227 seconds
Activated Clotting Time: 227 seconds

## 2015-03-01 LAB — CBC
HEMATOCRIT: 48.9 % (ref 39.0–52.0)
Hemoglobin: 15.9 g/dL (ref 13.0–17.0)
MCH: 29.5 pg (ref 26.0–34.0)
MCHC: 32.5 g/dL (ref 30.0–36.0)
MCV: 90.7 fL (ref 78.0–100.0)
Platelets: 164 10*3/uL (ref 150–400)
RBC: 5.39 MIL/uL (ref 4.22–5.81)
RDW: 19.7 % — ABNORMAL HIGH (ref 11.5–15.5)
WBC: 26.9 10*3/uL — ABNORMAL HIGH (ref 4.0–10.5)

## 2015-03-01 LAB — COMPREHENSIVE METABOLIC PANEL
ALBUMIN: 2.1 g/dL — AB (ref 3.5–5.2)
ALT: 61 U/L — AB (ref 0–53)
AST: 28 U/L (ref 0–37)
Alkaline Phosphatase: 176 U/L — ABNORMAL HIGH (ref 39–117)
Anion gap: 11 (ref 5–15)
BUN: 45 mg/dL — ABNORMAL HIGH (ref 6–23)
CALCIUM: 8.9 mg/dL (ref 8.4–10.5)
CO2: 21 mmol/L (ref 19–32)
Chloride: 101 mmol/L (ref 96–112)
Creatinine, Ser: 1.44 mg/dL — ABNORMAL HIGH (ref 0.50–1.35)
GFR calc Af Amer: 57 mL/min — ABNORMAL LOW (ref >90)
GFR calc non Af Amer: 49 mL/min — ABNORMAL LOW (ref >90)
Glucose, Bld: 223 mg/dL — ABNORMAL HIGH (ref 70–99)
Potassium: 4.8 mmol/L (ref 3.5–5.1)
SODIUM: 133 mmol/L — AB (ref 135–145)
Total Bilirubin: 2.7 mg/dL — ABNORMAL HIGH (ref 0.3–1.2)
Total Protein: 8.3 g/dL (ref 6.0–8.3)

## 2015-03-01 LAB — PHOSPHORUS: PHOSPHORUS: 2.9 mg/dL (ref 2.3–4.6)

## 2015-03-01 LAB — GLUCOSE, CAPILLARY
GLUCOSE-CAPILLARY: 228 mg/dL — AB (ref 70–99)
Glucose-Capillary: 197 mg/dL — ABNORMAL HIGH (ref 70–99)

## 2015-03-01 LAB — APTT: APTT: 72 s — AB (ref 24–37)

## 2015-03-01 MED ORDER — HEPARIN SODIUM (PORCINE) 1000 UNIT/ML IJ SOLN
2400.0000 [IU] | Freq: Once | INTRAMUSCULAR | Status: AC
  Administered 2015-03-01: 2400 [IU] via INTRAVENOUS

## 2015-03-01 MED ORDER — SODIUM CHLORIDE 0.9 % IV SOLN
1.0000 mg/h | INTRAVENOUS | Status: DC
  Administered 2015-03-01: 2 mg/h via INTRAVENOUS
  Filled 2015-03-01 (×3): qty 10

## 2015-03-01 MED ORDER — SODIUM CHLORIDE 0.9 % IV SOLN
50.0000 ug/h | INTRAVENOUS | Status: DC
  Administered 2015-03-01: 50 ug/h via INTRAVENOUS
  Filled 2015-03-01: qty 50

## 2015-03-01 MED ORDER — MIDAZOLAM BOLUS VIA INFUSION
5.0000 mg | INTRAVENOUS | Status: DC | PRN
  Filled 2015-03-01: qty 20

## 2015-03-01 MED ORDER — FENTANYL BOLUS VIA INFUSION
50.0000 ug | INTRAVENOUS | Status: DC | PRN
  Filled 2015-03-01: qty 200
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
## 2015-03-09 ENCOUNTER — Telehealth: Payer: Self-pay

## 2015-03-09 NOTE — Telephone Encounter (Signed)
Received death certificate from [facility] 03/09/15.  Leaving upstairs for Dr. Marchelle Gearingamaswamy to sign.  Received back 03/10/15 and called for pick-up.

## 2015-03-12 NOTE — Progress Notes (Signed)
Chaplain followed-up with Bryce [name]'s family.   Family affectionately calls him "football". Pt's sister and daughter bedside during visit.   Chaplain provided empathic listening, emotional support and facilitated story-telling.   Will continue to follow-up with family as needed.   Gala RomneyBrown, Tonja Jezewski J, Chaplain 03/09/2015

## 2015-03-12 NOTE — Progress Notes (Signed)
PULMONARY / CRITICAL CARE MEDICINE   Name: Bryce [name] MRN: [id number] DOB: 05/08/1946    ADMISSION DATE:  02/28/2015 CONSULTATION DATE:  3/8  REFERRING MD : [facility]  CHIEF COMPLAINT:  AMS  INITIAL PRESENTATION: 69 y/o M w/ PMHx of HTN, HLD, DM type II, chronic dCHF, BPH, and RBBB, admitted w/ purulent LE cellulitis, and oliguric AKI (Cr >6).  PCCM consulted for acute hypoxic respiratory failure and septic shock.   STUDIES:  3/8 US RUQ>>>No cholelithiasis or sonographic evidence of acute cholecystitis. 2. Heterogeneous hepatic echogenicity as can be seen with hepatocellular disease. 3/9 CT lower ext>>>soft tissue edema , no drainable fluid collection.  SIGNIFICANT EVENTS: 3/8 >> Transfer to ICU, shock, acidotic, intubation, pressors 3/8 - gram neg in blood, cvvhd 3/9 - CT done legs, continued high pressors 02/28/15: Balancing sedation with agitation and ability to wean has been difficult > was too sedated for PSV this am, planning to retry   SUBJECTIVE/OVERNIGHT/INTERVAL HX 02/10/15: WUA off fent gtt and on PSV - follows simple commands. Able to recognize daughter but has no capacity (denied he was sick, did not know he was in hospital. [facility] CRRT and levophed continue. Multiple family members at bedside, including DPOA - daughter 89[name]   VITAL SIGNS: Temp:  [96 F (35.6 C)-98 F (36.7 C)] 97.7 F (36.5 C) (03/21 0800) Pulse Rate:  [71-125] 77 (03/21 0431) Resp:  [12-27] 12 (03/21 0900) BP: (152-188)/(98-105) 152/98 mmHg (03/20 1626) SpO2:  [93 %-100 %] 100 % (03/21 0800) Arterial Line BP: (92-159)/(55-94) 129/76 mmHg (03/21 0900) FiO2 (%):  [30 %] 30 % (03/21 0825) Weight:  [97.7 kg (215 lb 6.2 oz)] 97.7 kg (215 lb 6.2 oz) (03/21 0100)   HEMODYNAMICS:     VENTILATOR SETTINGS: Vent Mode:  [-] CPAP;PSV FiO2 (%):  [30 %] 30 % Set Rate:  [25 bmp] 25 bmp Vt Set:  [540 mL] 540 mL PEEP:  [5 cmH20] 5 cmH20 Pressure Support:  [5  cmH20] 5 cmH20 Plateau Pressure:  [19 cmH20-22 cmH20] 20 cmH20   INTAKE / OUTPUT:  Intake/Output Summary (Last 24 hours) at 03/06/2015 1007 Last data filed at 03/07/2015 0900  Gross per 24 hour  Intake 2605.22 ml  Output   4514 ml  Net -1908.78 ml   PHYSICAL EXAMINATION: General:  Obese AAM, calm (on sedation) and following some commands Neuro:  RASS +1, nodded on command, opens eyes to stimulation  Psych: NO CAPACITY HEENT: ETT in place, OP moist Cardiovascular:  RRR distant. Lungs:  Coarse BS diffusely. Abdomen:  obese +bs, no r/g Musculoskeletal:  intact Skin:  Lower ext cellulitis, wrapped   LABS:  CBC  Recent Labs Lab 02/27/15 0345 02/28/15 0430 02/12/2015 0400  WBC 23.7* 22.7* 26.9*  HGB 14.3 15.1 15.9  HCT 45.3 48.6 48.9  PLT 152 166 164   BMET  Recent Labs Lab 02/28/15 0430 02/28/15 1642 03/09/2015 0400  NA 133* 134* 133*  K 4.6 4.6 4.8  CL 101 101 101  CO2 25 24 21   BUN 41* 41* 45*  CREATININE 1.36* 1.37* 1.44*  GLUCOSE 215* 227* 223*   Electrolytes  Recent Labs Lab 02/26/15 0420  02/27/15 0345  02/28/15 0430 02/28/15 1642 02/19/2015 0400  CALCIUM 8.7  < > 8.7  < > 8.6 9.1 8.9  MG 2.3  --  2.2  --  2.4  --   --   PHOS 2.1*  < > 1.9*  < > 3.3 3.5 2.9  < > =  values in this interval not displayed. Sepsis Markers No results for input(s): LATICACIDVEN, PROCALCITON, O2SATVEN in the last 168 hours. ABG  Recent Labs Lab 02/25/15 0454 02/26/15 0320 02/27/15 0317  PHART 7.304* 7.406 7.441  PCO2ART 48.6* 37.1 34.1*  PO2ART 119.0* 113.0* 69.0*   Liver Enzymes  Recent Labs Lab 02/23/15 0400  02/25/15 0330  02/27/15 1950 02/28/15 1642 03-28-2015 0400  AST 60*  --  50*  --   --   --  28  ALT 75*  --  56*  --   --   --  61*  ALKPHOS 204*  --  198*  --   --   --  176*  BILITOT 6.9*  --  4.6*  --   --   --  2.7*  ALBUMIN 1.7*  < > 1.9*  < > 1.9* 2.1* 2.1*  < > = values in this interval not displayed. Cardiac Enzymes No results for input(s):  TROPONINI, PROBNP in the last 168 hours. Glucose  Recent Labs Lab 02/28/15 0745 02/28/15 1155 02/28/15 1601 02/28/15 1940 02/28/15 2309 28-Mar-2015 0358  GLUCAP 219* 277* 200* 250* 217* 228*   Imaging No results found.   PROB Patient Active Problem List   Diagnosis Date Noted  . DNAR (do not attempt resuscitation) 28-Mar-2015  . Terminal care 2015/03/28  . Cellulitis   . Abnormal transaminases   . Sepsis 03/05/2015  . Acute renal failure 02/25/2015  . Left shoulder pain 06/26/2014  . Esophageal reflux 06/26/2014  . Depression 06/26/2014  . Obesity   . HTN (hypertension)   . HLD (hyperlipidemia)   . BPH (benign prostatic hypertrophy)   . CHF (congestive heart failure) 05/31/2014  . Cellulitis and abscess of leg 05/31/2014  . Heart failure 05/31/2014  . Acute respiratory failure 07/14/2013  . CAP (community acquired pneumonia) 07/14/2013  . Hyperlipemia 04/14/2009  . TOBACCO ABUSE 04/14/2009  . DIASTOLIC HEART FAILURE, CHRONIC 04/14/2009  . SHORTNESS OF BREATH 04/14/2009  . ABNORMAL EKG 04/14/2009  . DM (diabetes mellitus) type II controlled peripheral vascular disorder 04/01/2009  . HYPERTENSION, UNSPECIFIED 04/01/2009      ASSESSMENT / PLAN:  PULMONARY OETT 3/8 >> A: Acute Hypoxic Respiratory Failure Pulmonary Edema improved   P:   Terminal wean   CARDIOVASCULAR LIJ CVL 3/8 >> RIJ Temp HD Cath 3/8 >> Right Femoral A-line 3/8 >> A: Septic Shock Chronic dCHF Severe pulm htn Echo  3/10 >> RVSP 62, dilated RV   - still on levophed .  RENAL A:   Acute Renal Failure/ATN; likely 2/2 sepsis,  on CRRT Hyponatremia AG Metabolic Acidosis 2/2 acute renal failure/lactic acidosis   - still on CRRT   GASTROINTESTINAL A:   Obesity GI PPx Protein calorie mal nutrition   -on TF  HEMATOLOGIC A:   DVT PPx   - on Heparin [place]   INFECTIOUS A:   Purulent LE Cellulitis Sepsis/septic Shock Gram neg septic shock P:   BCx2  3/7>>morganella  morgani UC  3/8>>ng C diff neg 3/11   Abx: 3/7 vanc>>3/9 3/7 zoysn>>3/9 3/8 Imipenem>>>3/16 3/16 rocephin>>>  Wound care wrapping B LE's  ENDOCRINE A:  DM   SSI.  NEUROLOGIC A:   Intermittent agitation - does not have capacity March 28, 2015     FAMILY  - Updates: discussed with family 3/19, none present 3/20. IDT meeting with daughter Dellie Burns, nephew, patient sister, ex-wife and 2 other family members. All in support of daughter's decision. Explained indication for trach, ltach and prognosis of high  mortality and morbidity but not terminal prognosis. Best we can hope for is return to baseline wheelchair state and off trach and off HD but odds are small but not prohibitive. They understood the morbidity and mortality involved. Sister even had experience of another family member who came off trach. Daughter lost her husband to ? Homicide and son to dialysis. Was lot more pessimistic. They all conferred and prayed with chaplain and in unison agreed to terminal wean. Understand prognosi could be minutes, hours, day      The patient is critically ill with multiple organ systems failure and requires high complexity decision making for assessment and support, frequent evaluation and titration of therapies, application of advanced monitoring technologies and extensive interpretation of multiple databases.   Critical Care Time devoted to patient care services described in this note is  30  Minutes. This time reflects time of care of this signee Dr Kalman Shan. This critical care time does not reflect procedure time, or teaching time or supervisory time of PA/NP/Med student/Med Resident etc but could involve care discussion time    Dr. Kalman Shan, M.D., [facility].C.P Pulmonary and Critical Care Medicine Staff Physician [facility] Pager: [phone], If no answer or between  15:00h - 7:00h: call [phone]  02/12/2015 10:07  AM

## 2015-03-12 NOTE — Progress Notes (Signed)
Patient expired. Listen with second RN Luther HearingMorin for 1 minute with absent of heart sounds, absent of pulse and respirations. Family in room , support given and chaplin called.

## 2015-03-12 NOTE — Procedures (Signed)
Extubation Procedure Note  Patient Details:   Name: Bryce [name] DOB: 08/17/1946 MRN: [id number]   Airway Documentation:     Evaluation  O2 sats: stable throughout Complications: No apparent complications Patient did tolerate procedure well. Bilateral Breath Sounds: Diminished Suctioning: Airway No, terminal wean placed on 4L [place]  SPO2 96%. Family at bedside  Toula MoosCampbell, Laurey Salser Faulkner 02/24/2015, 11:25 AM

## 2015-03-12 NOTE — Progress Notes (Signed)
Chaplain paged to visit with pt's family members in conference room.   Pt daughter feels burdened with decisions regarding her father's care. She expressed that family members are "pushing it on me alone."   Pt daughter knows her father's wishes yet she is wrestling with seeing them through. She has asked for their input and they are telling her "do what's in your heart" and making religious references to the "will of God" being done.   Pt daughter further expressed that she has not encountered resistance from other family members but only has gotten solid reassurance from one.   A note of significance, pt daughter is meeting other family members for the 1st time in her life.   Chaplain provided emotional support, empathic listening, and exploring anticipatory grief.   Will follow-up.   Gala RomneyBrown, Alana Dayton J, Chaplain 02/27/2015

## 2015-03-12 NOTE — Progress Notes (Signed)
Patient taken to morgue after removing H/D cath, central line, and femoral line.

## 2015-03-12 NOTE — Progress Notes (Signed)
Inpatient Diabetes Program Recommendations  AACE/ADA: New Consensus Statement on Inpatient Glycemic Control (2013)  Target Ranges:  Prepandial:   less than 140 mg/dL      Peak postprandial:   less than 180 mg/dL (1-2 hours)      Critically ill patients:  140 - 180 mg/dL   Results for Bryce [name], Bryce [name] (MRN [id number]) as of 07-07-15 08:45  Ref. Range 02/28/2015 07:45 02/28/2015 11:55 02/28/2015 16:01 02/28/2015 19:40 02/28/2015 23:09 07-07-15 03:58  Glucose-Capillary Latest Range: 70-99 mg/dL 409219 (H) 811277 (H) 914200 (H) 250 (H) 217 (H) 228 (H)    Reason for Visit: Sepsis  Diabetes history: DM 2 Outpatient Diabetes medications: Metformin 500 mg BID Current orders for Inpatient glycemic control: Novolog 0-15 units Q4hrs  Inpatient Diabetes Program Recommendations  Insulin - Basal: Patient's glucose is consistently in the 200's. Please consider starting Lantus 10 units QHS.  Insulin - Meal Coverage: Since patient is on tube feeds, please consider Novolog 3 units Q4hrs for tube feed coverage in addition to correction (do not give if tube feeds are stopped or held for any reason).  Thanks,  Christena DeemShannon Kanyah Matsushima RN, MSN, [facility] Inpatient Diabetes Coordinator Team Pager [phone]

## 2015-03-12 NOTE — Progress Notes (Signed)
Chaplain responded to a request by the Nursing Unit to provide pastoral support to the family of the patient who had just died. Upon arrival to the Unit the Chaplain provided words of comfort and comfort measures as needed.  The family was informed all needed information by the Nursing staff, a Patient Placement card was given to the family to provide funeral home decision, and all contact information was presented the staff. The family was appreciative of all support provided to their loved one them during their at the hospital. On-Call Chaplain Janell Quietudrey Thornton [phone]

## 2015-03-12 NOTE — Progress Notes (Signed)
150cc fentanyl and 15 cc versed wasted in sink.  Witnessed by Karen ChafeJody Roberts RN

## 2015-03-12 NOTE — Progress Notes (Signed)
[facility] ROUNDING NOTE   Subjective:   Interval History: weaning from ventilator  Objective:  Vital signs in last 24 hours:  Temp:  [96 F (35.6 C)-98 F (36.7 C)] 97.7 F (36.5 C) (03/21 0800) Pulse Rate:  [71-125] 77 (03/21 0431) Resp:  [12-27] 12 (03/21 0900) BP: (152-188)/(98-105) 152/98 mmHg (03/20 1626) SpO2:  [95 %-100 %] 100 % (03/21 0800) Arterial Line BP: (92-159)/(55-92) 129/76 mmHg (03/21 0900) FiO2 (%):  [30 %] 30 % (03/21 0825) Weight:  [97.7 kg (215 lb 6.2 oz)] 97.7 kg (215 lb 6.2 oz) (03/21 0100)  Weight change: -2.1 kg (-4 lb 10.1 oz) Filed Weights   02/27/15 0357 02/28/15 0200 02/14/2015 0100  Weight: 102.3 kg (225 lb 8.5 oz) 99.8 kg (220 lb 0.3 oz) 97.7 kg (215 lb 6.2 oz)    Intake/Output: I/O last 3 completed shifts: In: 4343.2 [I.V.:2493.2; NG/GT:1800; IV Piggyback:50] Out: 7342 [Other:7342]   Intake/Output this shift:  Total I/O In: 170 [I.V.:80; NG/GT:90] Out: 422 [Other:422]  Sedated CVS- RRR RS- CTA ETT coarse breath sounds ABD- BS present soft non-distended EXT-  Cellulitis   Basic Metabolic Panel:  Recent Labs Lab 02/24/15 0410  02/25/15 0330  02/26/15 0420  02/27/15 0345 02/27/15 1950 02/28/15 0430 02/28/15 1642 03/08/2015 0400  NA 133*  < > 135  < > 136  < > 134* 134* 133* 134* 133*  K 4.4  < > 4.8  < > 4.5  < > 4.5 4.4 4.6 4.6 4.8  CL 102  < > 101  < > 103  < > 100 99 101 101 101  CO2 25  < > 27  < > 28  < > GLUCOSE 217*  < > 187*  < > 177*  < > 199* 220* 215* 227* 223*  BUN 47*  < > 45*  < > 45*  < > 40* 43* 41* 41* 45*  CREATININE 1.47*  < > 1.50*  < > 1.22  < > 1.30 1.40* 1.36* 1.37* 1.44*  CALCIUM 8.5  < > 8.9  < > 8.7  < > 8.7 8.4 8.6 9.1 8.9  MG 2.3  --  2.3  --  2.3  --  2.2  --  2.4  --   --   PHOS 2.7  < > 2.9  < > 2.1*  < > 1.9* 4.5 3.3 3.5 2.9  < > = values in this interval not displayed.  Liver Function Tests:  Recent Labs Lab 02/23/15 0400  02/25/15 0330  02/26/15 1556  02/27/15 0345 02/27/15 1950 02/28/15 1642 03/10/2015 0400  AST 60*  --  50*  --   --   --   --   --  28  ALT 75*  --  56*  --   --   --   --   --  61*  ALKPHOS 204*  --  198*  --   --   --   --   --  176*  BILITOT 6.9*  --  4.6*  --   --   --   --   --  2.7*  PROT 7.3  --  7.7  --   --   --   --   --  8.3  ALBUMIN 1.7*  < > 1.9*  < > 2.0* 1.9* 1.9* 2.1* 2.1*  < > = values in this interval not displayed. No results for input(s): LIPASE, AMYLASE in the last 168  hours. No results for input(s): AMMONIA in the last 168 hours.  CBC:  Recent Labs Lab 02/25/15 0330 02/26/15 0420 02/27/15 0345 02/28/15 0430 02/11/2015 0400  WBC 25.9* 23.5* 23.7* 22.7* 26.9*  HGB 14.3 14.1 14.3 15.1 15.9  HCT 46.0 43.8 45.3 48.6 48.9  MCV 95.0 91.6 91.5 92.2 90.7  PLT 141* 144* 152 166 164    Cardiac Enzymes: No results for input(s): CKTOTAL, CKMB, CKMBINDEX, TROPONINI in the last 168 hours.  BNP: Invalid input(s): POCBNP  CBG:  Recent Labs Lab 02/28/15 1155 02/28/15 1601 02/28/15 1940 02/28/15 2309 02/09/2015 0358  GLUCAP 277* 200* 250* 217* 228*    Microbiology: Results for orders placed or performed during the hospital encounter of 09/27/2015  Blood Culture (routine x 2)     Status: None   Collection Time: 09/27/2015 11:16 AM  Result Value Ref Range Status   Specimen Description BLOOD RIGHT ARM  Final   Special Requests BOTTLES DRAWN AEROBIC AND ANAEROBIC 5CC  Final   Culture   Final    MORGANELLA MORGANII Note: Gram Stain Report Called to,Read Back By and Verified With: CHRIS LUCK @ 9:11 AM 02/16/15 BY DWEEKS Performed at [facility]    Report Status 02/18/2015 FINAL  Final   Organism ID, Bacteria MORGANELLA MORGANII  Final      Susceptibility   Morganella morganii - MIC*    AMPICILLIN >=32 RESISTANT Resistant     AMPICILLIN/SULBACTAM 16 INTERMEDIATE Intermediate     CEFAZOLIN >=64 RESISTANT Resistant     CEFEPIME <=1 SENSITIVE Sensitive     CEFTAZIDIME <=1 SENSITIVE  Sensitive     CEFTRIAXONE <=1 SENSITIVE Sensitive     CIPROFLOXACIN <=0.25 SENSITIVE Sensitive     GENTAMICIN <=1 SENSITIVE Sensitive     IMIPENEM 1 SENSITIVE Sensitive     PIP/TAZO <=4 SENSITIVE Sensitive     TOBRAMYCIN <=1 SENSITIVE Sensitive     TRIMETH/SULFA <=20 SENSITIVE Sensitive     * MORGANELLA MORGANII  Blood Culture (routine x 2)     Status: None   Collection Time: 09/27/2015 12:14 PM  Result Value Ref Range Status   Specimen Description BLOOD LEFT ANTECUBITAL  Final   Special Requests BOTTLES DRAWN AEROBIC AND ANAEROBIC 3CC  Final   Culture   Final    NO GROWTH 5 DAYS Performed at [facility]    Report Status 02/21/2015 FINAL  Final  MRSA PCR Screening     Status: Abnormal   Collection Time: 09/27/2015  6:02 PM  Result Value Ref Range Status   MRSA by PCR POSITIVE (A) NEGATIVE Final    Comment:        The GeneXpert MRSA Assay (FDA approved for NASAL specimens only), is one component of a comprehensive MRSA colonization surveillance program. It is not intended to diagnose MRSA infection nor to guide or monitor treatment for MRSA infections. RESULT CALLED TO, READ BACK BY AND VERIFIED WITH: A WEATHERFORD RN 2152 02/25/2015 A BROWNING   Culture, Urine     Status: None   Collection Time: 02/16/15  4:35 PM  Result Value Ref Range Status   Specimen Description URINE, RANDOM  Final   Special Requests Normal  Final   Colony Count NO GROWTH Performed at [facility]   Final   Culture NO GROWTH Performed at [facility]   Final   Report Status 02/18/2015 FINAL  Final  Culture, blood (routine x 2)     Status: None   Collection Time: 02/18/15 12:55  AM  Result Value Ref Range Status   Specimen Description BLOOD RIGHT ARM  Final   Special Requests BOTTLES DRAWN AEROBIC ONLY 6CCS  Final   Culture   Final    NO GROWTH 5 DAYS Performed at [facility]    Report Status 02/24/2015 FINAL  Final  Culture, blood (routine x 2)     Status: None    Collection Time: 02/18/15  1:01 AM  Result Value Ref Range Status   Specimen Description BLOOD RIGHT HAND  Final   Special Requests BOTTLES DRAWN AEROBIC ONLY 10CC  Final   Culture   Final    NO GROWTH 5 DAYS Note: Culture results may be compromised due to an excessive volume of blood received in culture bottles. Performed at [facility]    Report Status 02/24/2015 FINAL  Final  Clostridium Difficile by PCR     Status: None   Collection Time: 02/19/15 12:35 PM  Result Value Ref Range Status   C difficile by pcr NEGATIVE NEGATIVE Final    Coagulation Studies: No results for input(s): LABPROT, INR in the last 72 hours.  Urinalysis: No results for input(s): COLORURINE, LABSPEC, PHURINE, GLUCOSEU, HGBUR, BILIRUBINUR, KETONESUR, PROTEINUR, UROBILINOGEN, NITRITE, LEUKOCYTESUR in the last 72 hours.  Invalid input(s): APPERANCEUR    Imaging: No results found.   Medications:   . fentaNYL infusion INTRAVENOUS 50 mcg/hr (23-Mar-2015 1102)  . midazolam (VERSED) infusion 2 mg/hr (03-23-15 1102)     fentaNYL, midazolam, midazolam  Assessment/ Plan:  Admitted with LE cellulitis and oliguric renal failure. Acute hypoxic respiratory failure Patient to be weaned off ventilator today. It appears that this is a terminal wean according to note from Dr Laray Anger. Chaplain has offered his support  We shall stop dialysis at this point and explained poor prognosis to family. They all appear to be appropriately behaving   LOS: 14 Bryce [name] W  :13 AM

## 2015-03-12 NOTE — Progress Notes (Signed)
Family conference held with pt's daughter Drinda Butts(Annette), sisters, niece,  and other family members and MD ( Dr. Marchelle Gearingamaswamy). Family was updated regarding pt's status (prognosis, quality of life). Pt 's daughter reiterated that pt would not want to live on life support. Other family members also expressed their religious stance and supported the daughter's(Annette) decision. The chaplain provided comfort and prayed with family. Compassion cart ordered for family.  CRRT stopped per Renal MD (Dr. Hyman HopesWebb). HD drsg changed. Ports flushed with heparin.  Order received for extubation and comfort care. Rt notified. Pt's daughter and family informed. They verbalized understanding. Pt extubated to [place] 4L. Fentanyl and versed drips started for comfort. Will cont to monitor and provide comfort.

## 2015-03-12 DEATH — deceased

## 2015-07-01 IMAGING — CR DG CHEST 1V PORT
1 series · 1 of 1 positions shown · non-contrast
Comparison: 02/14/2011

CLINICAL DATA: Shortness of breath

PORTABLE CHEST - 1 VIEW

[AP]
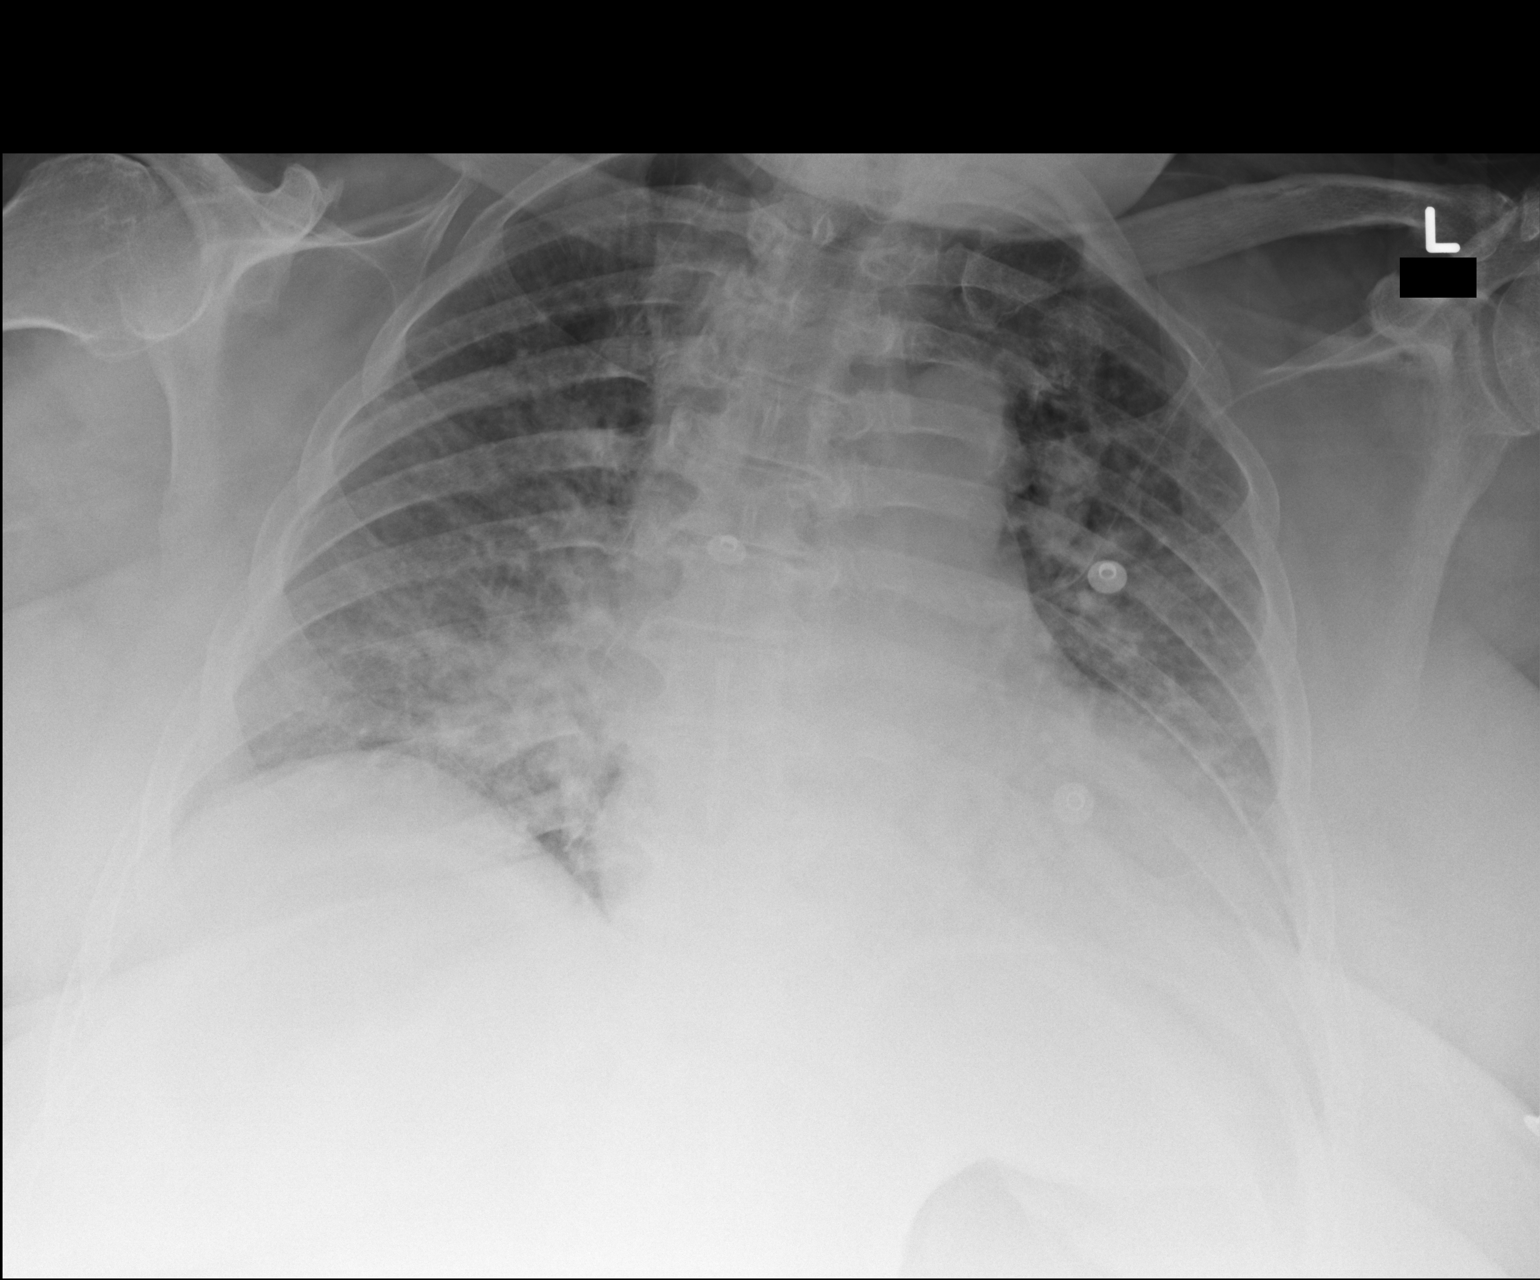

[1 of 1 positions shown; findings below may reference images not displayed]

FINDINGS: Cardiomegaly evident with diffuse symmetric airspace
disease versus edema throughout the lungs.  CHF is favored.  No
large effusion.  There is associated basilar atelectasis.  No
pneumothorax.
IMPRESSION: Mild to moderate CHF pattern.

Basilar atelectasis

## 2015-07-04 IMAGING — CR DG ABD PORTABLE 1V
1 series · 1 of 1 positions shown · non-contrast
Comparison: Chest x-ray same day

CLINICAL DATA: Orogastric tube placement

PORTABLE ABDOMEN - 1 VIEW

[AP]
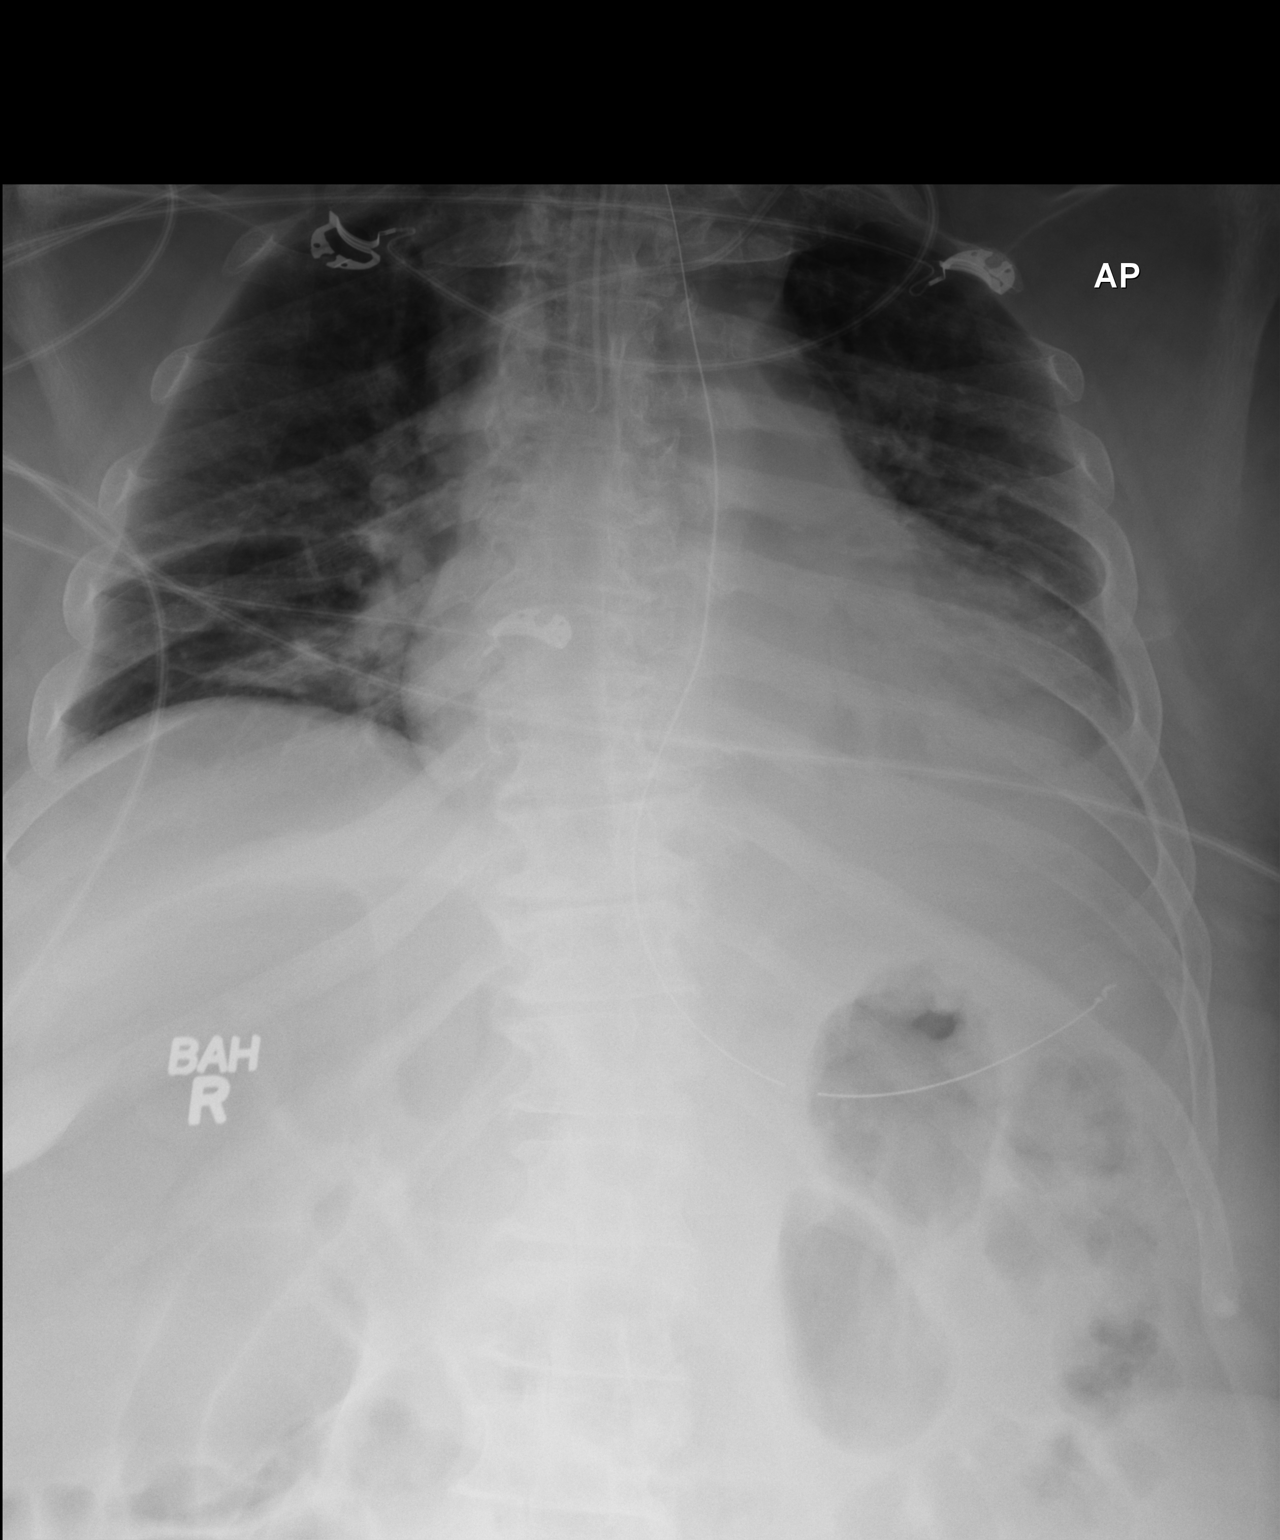

[1 of 1 positions shown; findings below may reference images not displayed]

FINDINGS: Cardiomegaly again noted.  Endotracheal tube with tip at
the level of the carina.  Mild basilar atelectasis.  Endotracheal
tube should be retracted about 0.5 cm.  NG tube in place with tip
in proximal stomach.  No diagnostic pneumothorax. No pulmonary
edema or segmental infiltrate.
IMPRESSION: Endotracheal tube with tip at the level of the carina.  Mild
basilar atelectasis.  Endotracheal tube should be retracted about
0.5 cm.  NG tube in place with tip in proximal stomach.  No
diagnostic pneumothorax. No pulmonary edema or segmental
infiltrate.

## 2015-07-04 IMAGING — CR DG CHEST 1V PORT
1 series · 1 of 1 positions shown · non-contrast
Comparison: Chest x-ray 07/15/2013.

CLINICAL DATA: Evaluate endotracheal tube placement.

PORTABLE CHEST - 1 VIEW

[AP]
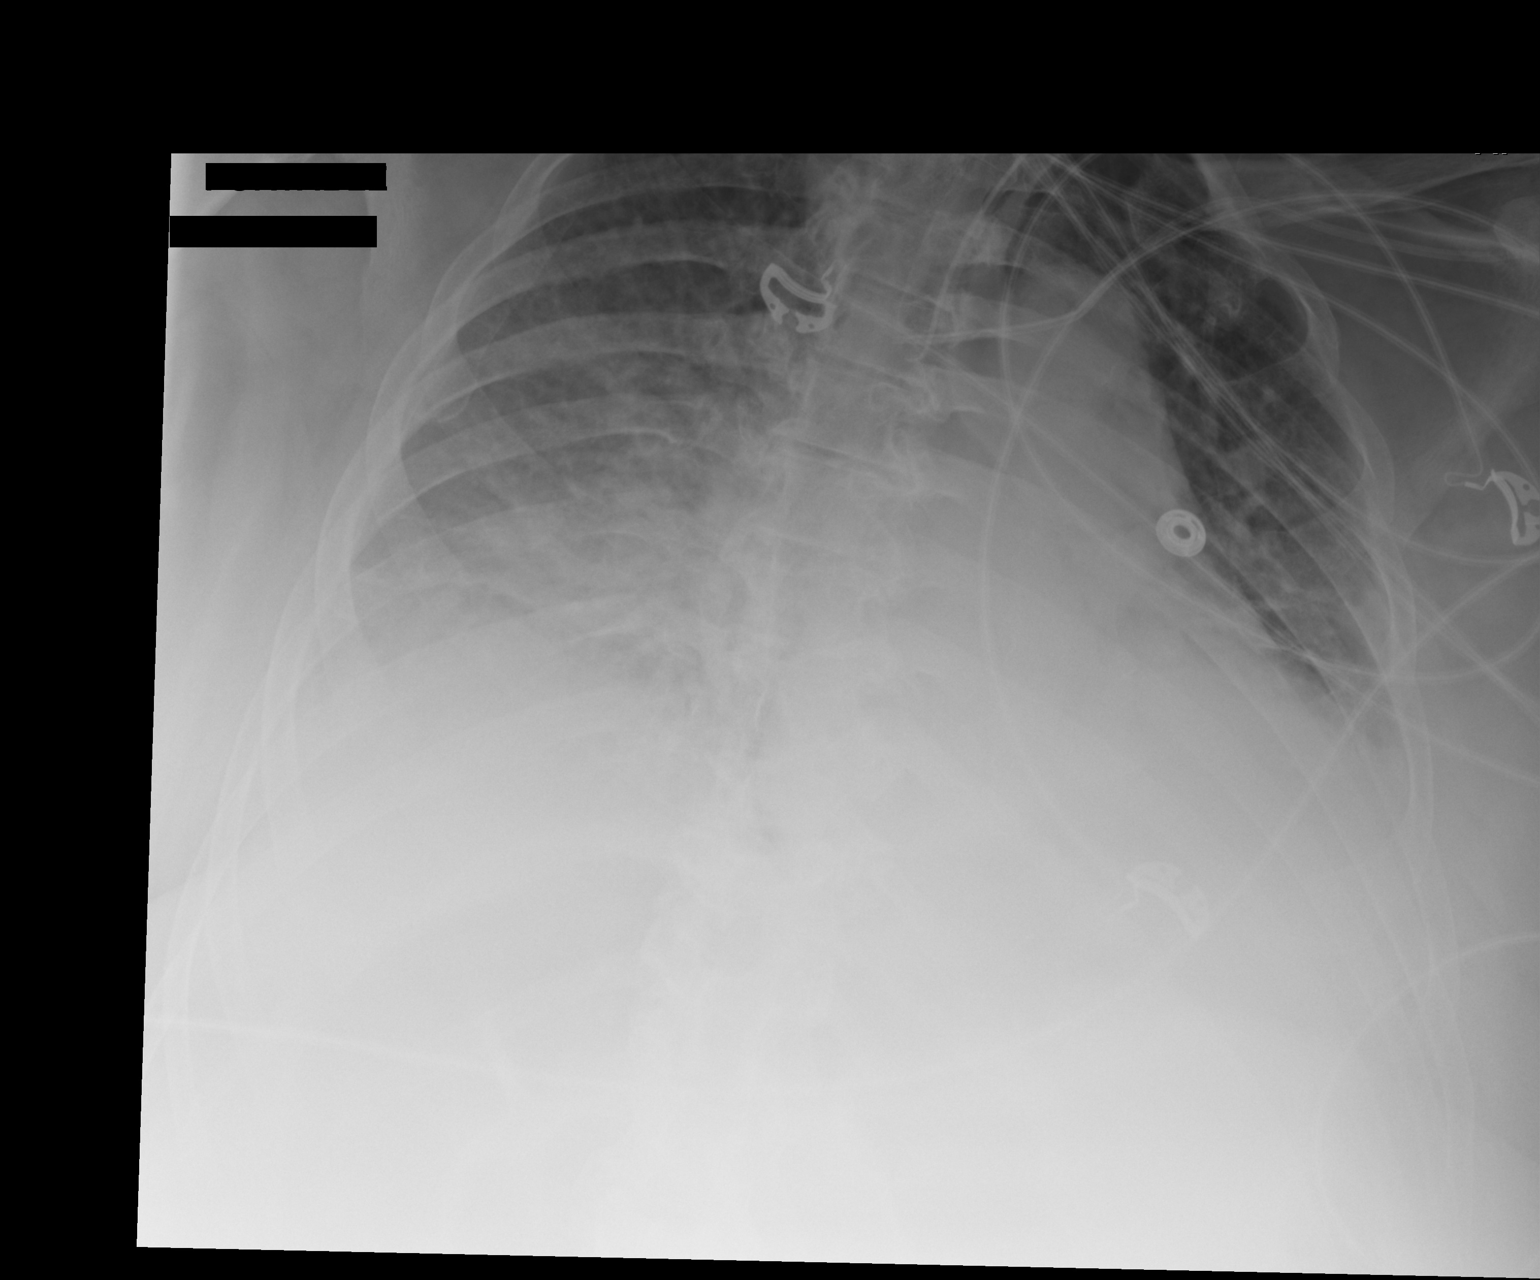

[1 of 1 positions shown; findings below may reference images not displayed]

FINDINGS: An endotracheal tube is in place with tip 2.0 cm above
the carina.  A left subclavian central venous catheter with tip
terminating in the proximal to mid superior vena cava.  Lung
volumes are low.  Substantially worsened bibasilar opacities may
reflect areas of increasing atelectasis and/or consolidation.
Small bilateral pleural effusions.  Crowding of the pulmonary
vasculature with diffuse indistinctness of the interstitial
markings.  Mild cardiomegaly. The patient is rotated to the left on
today's exam, resulting in distortion of the mediastinal contours
and reduced diagnostic sensitivity and specificity for mediastinal
pathology.  Atherosclerosis in the thoracic aorta.
IMPRESSION: 1.  Support apparatus, as above.
2.  Substantially worsened aeration predominantly related to
worsening bibasilar atelectasis and/or consolidation and increasing
small bilateral pleural effusions.
3.  In addition, there is some cephalization of the pulmonary
vasculature and indistinctness of interstitial markings such that a
component of superimposed mild pulmonary edema is not excluded.

## 2015-07-06 IMAGING — CR DG CHEST 1V PORT
1 series · 1 of 1 positions shown · non-contrast
Comparison: Portable chest x-rays 07/16/2013 dating back to
07/13/2013.

CLINICAL DATA: Follow up basilar atelectasis and/or pneumonia and
vascular congestion.

PORTABLE CHEST - 1 VIEW

[AP]
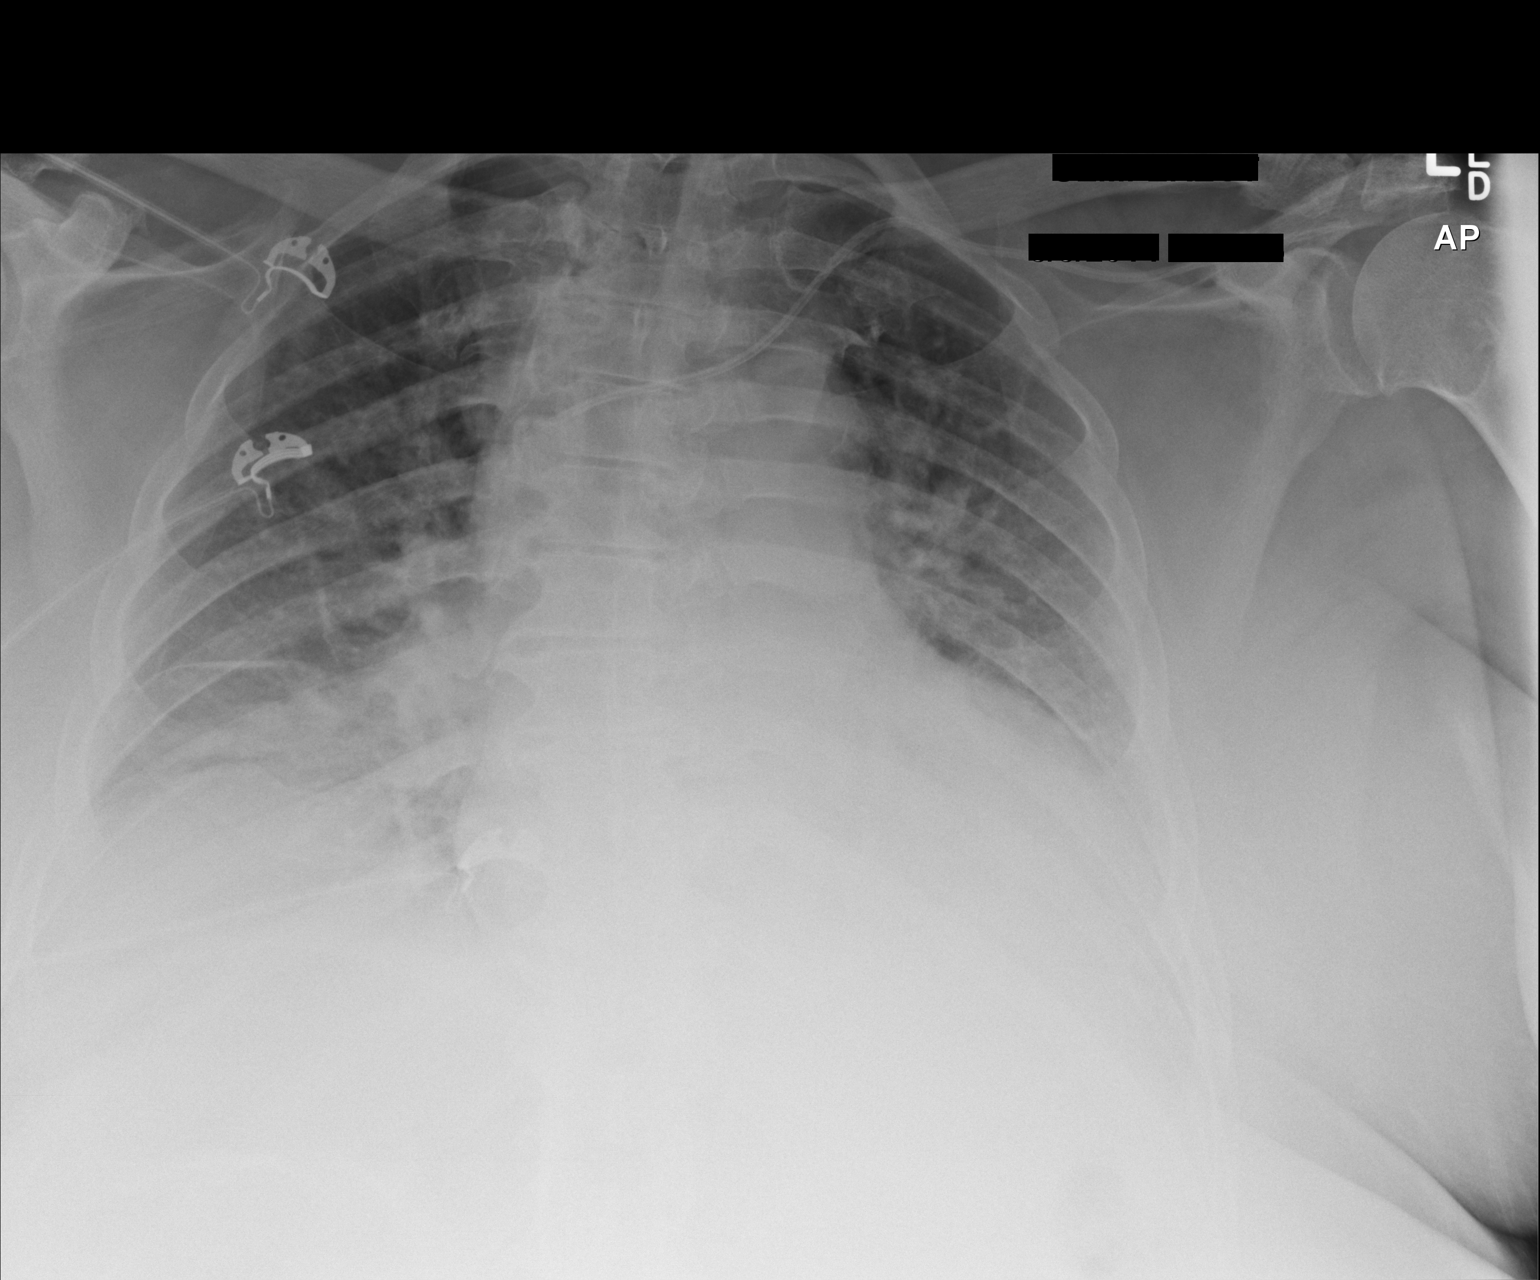

[1 of 1 positions shown; findings below may reference images not displayed]

FINDINGS: Left subclavian central venous catheter tip projects over
the upper SVC.  Cardiac silhouette enlarged.  Pulmonary venous
hypertension without overt edema, slightly improved since the
examination 2 days ago.  Stable dense consolidation in the left
lower lobe and confluent airspace opacities at the right lung base.
No new pulmonary parenchymal abnormalities.
IMPRESSION: Improved pulmonary venous hypertension/pulmonary edema since 2 days
ago.  Stable bilateral lower lobe atelectasis and/or pneumonia,
left greater than right.  No new abnormalities.

## 2015-07-08 IMAGING — CR DG CHEST 1V PORT
1 series · 1 of 1 positions shown · non-contrast
Comparison: 07/18/2013

CLINICAL DATA: Follow up edema

PORTABLE CHEST - 1 VIEW

[AP]
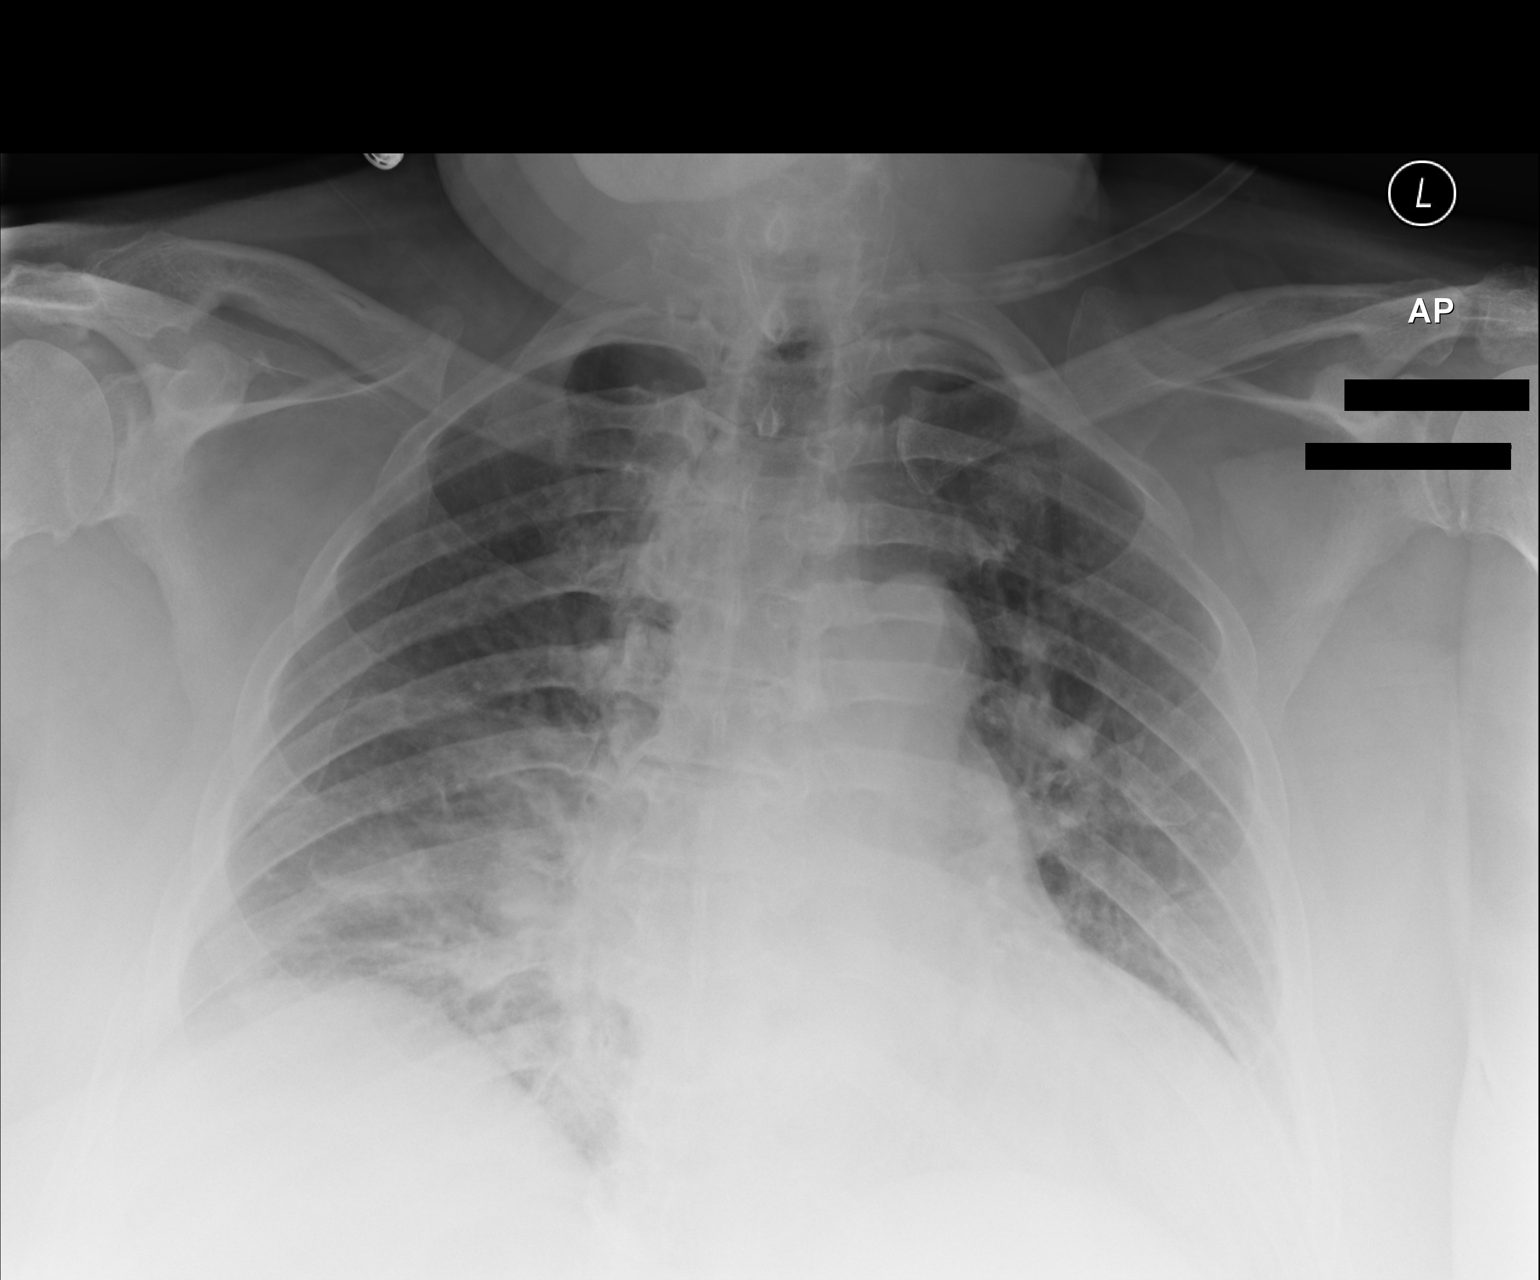

[1 of 1 positions shown; findings below may reference images not displayed]

FINDINGS: Central disc catheter has been removed.  No pneumothorax.

Mild improvement in bibasilar consolidation.  Improvement in mild
vascular congestion.  No significant effusion.
IMPRESSION: Mild improvement in bilateral airspace disease.  This may be due to
pneumonia.  Mild heart failure may also be present.
